# Patient Record
Sex: Male | Born: 1960 | Race: White | Hispanic: No | Marital: Married | Smoking: Never smoker
Health system: Southern US, Community
[De-identification: ages and names within clinical notes are randomized; demographics above are authoritative.]

## PROBLEM LIST (undated history)

## (undated) DIAGNOSIS — K5903 Drug induced constipation: Secondary | ICD-10-CM

## (undated) DIAGNOSIS — M1990 Unspecified osteoarthritis, unspecified site: Secondary | ICD-10-CM

## (undated) DIAGNOSIS — G629 Polyneuropathy, unspecified: Secondary | ICD-10-CM

## (undated) DIAGNOSIS — T402X5A Adverse effect of other opioids, initial encounter: Secondary | ICD-10-CM

## (undated) DIAGNOSIS — F419 Anxiety disorder, unspecified: Secondary | ICD-10-CM

## (undated) DIAGNOSIS — F32A Depression, unspecified: Secondary | ICD-10-CM

## (undated) DIAGNOSIS — Z87442 Personal history of urinary calculi: Secondary | ICD-10-CM

## (undated) HISTORY — PX: COLONOSCOPY: SHX174

## (undated) HISTORY — PX: JOINT REPLACEMENT: SHX530

## (undated) HISTORY — PX: THORACIC DISC SURGERY: SHX801

## (undated) HISTORY — PX: ROTATOR CUFF REPAIR: SHX139

---

## 2002-04-07 ENCOUNTER — Encounter: Payer: Self-pay | Admitting: Orthopedic Surgery

## 2002-04-07 ENCOUNTER — Ambulatory Visit (HOSPITAL_COMMUNITY): Admission: RE | Admit: 2002-04-07 | Discharge: 2002-04-07 | Payer: Self-pay | Admitting: Orthopedic Surgery

## 2002-04-13 ENCOUNTER — Encounter: Admission: RE | Admit: 2002-04-13 | Discharge: 2002-05-04 | Payer: Self-pay | Admitting: Orthopedic Surgery

## 2015-04-25 ENCOUNTER — Ambulatory Visit: Payer: BLUE CROSS/BLUE SHIELD | Attending: Neurosurgery | Admitting: Physical Therapy

## 2015-04-25 ENCOUNTER — Encounter: Payer: Self-pay | Admitting: Physical Therapy

## 2015-04-25 DIAGNOSIS — M5010 Cervical disc disorder with radiculopathy, unspecified cervical region: Secondary | ICD-10-CM | POA: Diagnosis present

## 2015-04-25 DIAGNOSIS — M25511 Pain in right shoulder: Secondary | ICD-10-CM | POA: Diagnosis present

## 2015-04-25 DIAGNOSIS — M542 Cervicalgia: Secondary | ICD-10-CM | POA: Diagnosis present

## 2015-04-25 DIAGNOSIS — M25512 Pain in left shoulder: Secondary | ICD-10-CM | POA: Diagnosis present

## 2015-04-25 NOTE — Therapy (Signed)
[facility] [address] [place], [place], [address] Phone: [phone]   Fax:  [phone]  Physical Therapy Evaluation  Patient Details  Name: Chase Blockerric Peyton MRN: [id number] Date of Birth: 07-19-1960 Referring Provider: Judge Stalllifton Baker  Encounter Date: 04/25/2015      PT End of Session - 04/25/15 0858    Visit Number 1   Date for PT Re-Evaluation 06/16/15   PT Start Time 0838   PT Stop Time 0940   PT Time Calculation (min) 62 min   Activity Tolerance Patient tolerated treatment well   Behavior During Therapy [facility] for tasks assessed/performed      History reviewed. No pertinent past medical history.  History reviewed. No pertinent past surgical history.  There were no vitals filed for this visit.  Visit Diagnosis:  Cervical disc disorder with radiculopathy of cervical region - Plan: PT plan of care cert/re-cert  Cervical pain - Plan: PT plan of care cert/re-cert  Pain of both shoulder joints - Plan: PT plan of care cert/re-cert      Subjective Assessment - 04/25/15 0831    Subjective Patient reports neck and pain in both shoulders, reports that he has soime numbness in the right side of the face, reports that he was on prednisone and that helped a lot, now off of it and hurting more.   Diagnostic tests MRI showed nerve root impingement and stenosis   Patient Stated Goals have less pain   Currently in Pain? Yes   Pain Score 8    Pain Location Shoulder   Pain Orientation Right;Left   Pain Descriptors / Indicators Aching;Sharp;Shooting;Stabbing   Pain Type Acute pain   Pain Onset More than a month ago   Pain Frequency Constant   Aggravating Factors  reports that with any reaching, lifting, pushing and pulling he has increased bilateral shoulder pain up to 9/10, c/o numbness in the right side of the face and the right 4th and 5th digits   Pain Relieving Factors rest and prednisone, had pain down to 2/10   Effect  of Pain on Daily Activities difficulty with all things            [facility] - 04/25/15 0001    Assessment   Medical Diagnosis cervical radiculopathy   Referring Provider Judge Stalllifton Baker   Onset Date/Surgical Date 04/16/15   Hand Dominance Right   Prior Therapy none   Precautions   Precautions None   Balance Screen   Has the patient fallen in the past 6 months No   Has the patient had a decrease in activity level because of a fear of falling?  No   Is the patient reluctant to leave their home because of a fear of falling?  No   Home Environment   Additional Comments housework and yardwork   Prior Function   Level of Independence Independent   Vocation Full time employment   Vocation Requirements mostly sitting   Leisure go to gym 3x/week   AROM   Overall AROM Comments Cervical ROM was decreased 25% for all motions with pain, Shoulder ROM was WFL's with some gaurding and pain in the shoulders   Strength   Overall Strength Comments Shoulder strength was 4/5 for flexion and abduction, ER was 3+/5 bilaterally with pain in the shoulders, IR was 4-/5 with pain, biceps 4/5, triceps 4/5   Flexibility   Soft Tissue Assessment /Muscle Length --  + neural tension signs in the right  UE   Palpation   Palpation comment he is tight in the upper traps, tender in the posterior shoulders.   Special Tests    Special Tests --  + ulnar nerve tension                   [facility] Adult PT Treatment/Exercise - 04/25/15 0001    Modalities   Modalities Electrical Stimulation;Moist Heat;Traction   Moist Heat Therapy   Number Minutes Moist Heat 15 Minutes   Moist Heat Location Cervical   Electrical Stimulation   Electrical Stimulation Location cervical   Electrical Stimulation Action IFC   Electrical Stimulation Parameters tolerance   Electrical Stimulation Goals Pain   Traction   Type of Traction Cervical   Min (lbs) 16   Hold Time static   Time 15                   PT Short Term Goals - 04/25/15 0903    PT SHORT TERM GOAL #1   Title independent with initial HEP   Time 1   Period Weeks   Status New           PT Long Term Goals - 04/25/15 0907    PT LONG TERM GOAL #1   Title increase cervical ROM to WFL's   Time 8   Period Weeks   Status New   PT LONG TERM GOAL #2   Title report pain decreased 25%   Time 8   Period Weeks   Status New   PT LONG TERM GOAL #3   Title report that he can do yardwork without difficulty   Time 8   Period Weeks   Status New   PT LONG TERM GOAL #4   Title report that he understands posture and body mechanics   Time 8   Period Weeks   Status New               Plan - 04/25/15 0900    Clinical Impression Statement Patient with cervical radiculopathy, worse on the right, numb in the right ulnar dermatome, + neural tension bilateral but R>L.     Pt will benefit from skilled therapeutic intervention in order to improve on the following deficits Pain;Impaired UE functional use;Increased muscle spasms;Impaired flexibility;Decreased strength;Decreased range of motion   Rehab Potential Good   PT Frequency 2x / week   PT Duration 6 weeks   PT Treatment/Interventions Electrical Stimulation;Moist Heat;Therapeutic exercise;Traction;Ultrasound;Manual techniques;Patient/family education   PT Next Visit Plan See if traction helped, may look at gym activities for stability exercises   Consulted and Agree with Plan of Care Patient         Problem List There are no active problems to display for this patient.   Jearld Lesch., PT 04/25/2015, 9:16 AM  [facility] [address] [place], [place], [address] Phone: [phone]   Fax:  [phone]  Name: Chase Gonzalez MRN: [id number] Date of Birth: [birth year]

## 2015-05-01 ENCOUNTER — Ambulatory Visit: Payer: BLUE CROSS/BLUE SHIELD | Admitting: Physical Therapy

## 2015-05-01 ENCOUNTER — Encounter: Payer: Self-pay | Admitting: Physical Therapy

## 2015-05-01 DIAGNOSIS — M542 Cervicalgia: Secondary | ICD-10-CM

## 2015-05-01 DIAGNOSIS — M25512 Pain in left shoulder: Secondary | ICD-10-CM

## 2015-05-01 DIAGNOSIS — M5010 Cervical disc disorder with radiculopathy, unspecified cervical region: Secondary | ICD-10-CM | POA: Diagnosis not present

## 2015-05-01 DIAGNOSIS — M25511 Pain in right shoulder: Secondary | ICD-10-CM

## 2015-05-01 NOTE — Therapy (Signed)
[address] [place], [place], [address] Phone: [phone]   Fax:  [phone]  Physical Therapy Treatment  Patient Details  Name: Chase Gonzalez MRN: [id number] Date of Birth: 1960/12/07 Referring Provider: Guadalupe Maple  Encounter Date: 05/01/2015      PT End of Session - 05/01/15 1646    Visit Number 2   Date for PT Re-Evaluation 06/16/15   PT Start Time 1600   PT Stop Time 1704   PT Time Calculation (min) 64 min      History reviewed. No pertinent past medical history.  History reviewed. No pertinent past surgical history.  There were no vitals filed for this visit.  Visit Diagnosis:  Cervical pain  Pain of both shoulder joints  Cervical disc disorder with radiculopathy of cervical region      Subjective Assessment - 05/01/15 1557    Subjective Pt reports no change since eval. Reports a pinched nerve in R elbow and bicep   Pain Score 0-No pain   Pain Location Knee   Pain Orientation Left   Pain Descriptors / Indicators Aching                         [facility] Adult PT Treatment/Exercise - 05/01/15 0001    Exercises   Exercises Lumbar;Knee/Hip;Shoulder   Lumbar Exercises: Supine   Other Supine Lumbar Exercises Neck retraction 3 way x15 supine on physo ball   Knee/Hip Exercises: Aerobic   Nustep L5 X6 min   Knee/Hip Exercises: Standing   Other Standing Knee Exercises standing rev grip rows #45 2x15; standing straight arm pull downs #45 2x15    Shoulder Exercises: Seated   Row Both;15 reps;Weights  2 sets    Row Weight (lbs) #35   Other Seated Exercises Lat pull downs #35 2x15   Shoulder Exercises: Standing   Horizontal ABduction 12 reps;Theraband  2 sets   Theraband Level (Shoulder Horizontal ABduction) Level 4 (Blue)   External Rotation 12 reps;Theraband  2 sets    Theraband Level (Shoulder External Rotation) Level 1 (Yellow)   Moist Heat Therapy   Number Minutes  Moist Heat 15 Minutes   Moist Heat Location Cervical   Electrical Stimulation   Electrical Stimulation Location bilat shoulder    Electrical Stimulation Action premod   Electrical Stimulation Parameters tolerance    Electrical Stimulation Goals Pain   Traction   Type of Traction Cervical   Min (lbs) 16   Hold Time static   Time 10   Manual Therapy   Manual Therapy Soft tissue mobilization;Passive ROM   Soft tissue mobilization Cervical para spinales, scalenes    Passive ROM Cervical ROM all direction, taken to end ranges and held                  PT Short Term Goals - 05/01/15 1648    PT SHORT TERM GOAL #1   Title independent with initial HEP   Status Partially Met           PT Long Term Goals - 04/25/15 0907    PT LONG TERM GOAL #1   Title increase cervical ROM to WFL's   Time 8   Period Weeks   Status New   PT LONG TERM GOAL #2   Title report pain decreased 25%   Time 8   Period Weeks   Status New   PT LONG TERM GOAL #3   Title  report that he can do yardwork without difficulty   Time 8   Period Weeks   Status New   PT LONG TERM GOAL #4   Title report that he understands posture and body mechanics   Time 8   Period Weeks   Status New               Plan - 05/01/15 1646    Clinical Impression Statement Progresses to gym level exercises challenging the cervical spine and back. Performed all interventions well, does report numbness when actively turning his head to the right and pushing back against physo ball. shows good strength with rows and Lat pull down.    Pt will benefit from skilled therapeutic intervention in order to improve on the following deficits Pain;Impaired UE functional use;Increased muscle spasms;Impaired flexibility;Decreased strength;Decreased range of motion   Rehab Potential Good   PT Duration 6 weeks   PT Treatment/Interventions Electrical Stimulation;Moist Heat;Therapeutic exercise;Traction;Ultrasound;Manual  techniques;Patient/family education   PT Next Visit Plan See if traction helped again, gym activities for stability exercises        Problem List There are no active problems to display for this patient.   Scot Jun, PTA 05/01/2015, 4:56 PM  [address] [place], [place], [address] Phone: [phone]   Fax:  [phone]  Name: Chase Gonzalez MRN: [id number] Date of Birth: 06-26-1960

## 2015-05-03 ENCOUNTER — Ambulatory Visit: Payer: BLUE CROSS/BLUE SHIELD | Admitting: Physical Therapy

## 2015-05-03 ENCOUNTER — Encounter: Payer: Self-pay | Admitting: Physical Therapy

## 2015-05-03 DIAGNOSIS — M542 Cervicalgia: Secondary | ICD-10-CM

## 2015-05-03 DIAGNOSIS — M25512 Pain in left shoulder: Secondary | ICD-10-CM

## 2015-05-03 DIAGNOSIS — M5010 Cervical disc disorder with radiculopathy, unspecified cervical region: Secondary | ICD-10-CM

## 2015-05-03 DIAGNOSIS — M25511 Pain in right shoulder: Secondary | ICD-10-CM

## 2015-05-03 NOTE — Therapy (Signed)
[facility] [place] [facility] [address] [place], [place], [address] Phone: [phone]   Fax:  [phone]  Physical Therapy Treatment  Patient Details  Name: Chase Gonzalez MRN: [id number] Date of Birth: December 21, 1960 Referring Provider: Judge Stall  Encounter Date: 05/03/2015      PT End of Session - 05/03/15 1638    Visit Number 3   Date for PT Re-Evaluation 06/16/15   PT Start Time 1610   PT Stop Time 1710   PT Time Calculation (min) 60 min      History reviewed. No pertinent past medical history.  History reviewed. No pertinent past surgical history.  There were no vitals filed for this visit.  Visit Diagnosis:  Cervical pain  Pain of both shoulder joints  Cervical disc disorder with radiculopathy of cervical region      Subjective Assessment - 05/03/15 1610    Subjective Pt reports numbness down his left arm is bothering him the most.  Pain c/o pain in his left shoulder that keeps him up at night, and pain in the right shoulder with lifting. Bathing is particuarly hard for him. DId feel a lot of relief and slept through the night after therapy on Tuesday.    Currently in Pain? No/denies   Pain Score 0-No pain                         [facility] Adult PT Treatment/Exercise - 05/03/15 0001    Lumbar Exercises: Aerobic   UBE (Upper Arm Bike) L2 fwd/ 3 back   Lumbar Exercises: Supine   Other Supine Lumbar Exercises Neck retraction 3 way x15    Other Supine Lumbar Exercises chin tucks ball on wall x 15   Shoulder Exercises: Seated   Row Both;15 reps;Weights  2 sets    Row Weight (lbs) #35   Shoulder Exercises: Standing   Horizontal ABduction 12 reps;Theraband  2 sets   Theraband Level (Shoulder Horizontal ABduction) Level 4 (Blue)   External Rotation 12 reps;Theraband  2 sets    Theraband Level (Shoulder External Rotation) Level 1 (Yellow)   Modalities   Modalities Electrical Stimulation;Moist  Heat;Traction   Moist Heat Therapy   Number Minutes Moist Heat 15 Minutes   Moist Heat Location Cervical;Shoulder   Electrical Stimulation   Electrical Stimulation Location bilat shoulder    Electrical Stimulation Action Premod   Electrical Stimulation Parameters tolerance   Electrical Stimulation Goals Pain   Traction   Type of Traction Cervical   Min (lbs) 16   Hold Time static   Time 15                PT Education - 05/03/15 1637    Education provided Yes   Education Details Instructed patients on chin tucks           PT Short Term Goals - 05/03/15 1641    PT SHORT TERM GOAL #1   Title independent with initial HEP   Baseline Reports he has been working on tendon glide exercises still can not complete without pain   Status Achieved           PT Long Term Goals - 05/03/15 1642    PT LONG TERM GOAL #2   Title report pain decreased 25%   Status On-going   PT LONG TERM GOAL #3   Title report that he can do yardwork without difficulty   Status On-going   PT  LONG TERM GOAL #4   Title report that he understands posture and body mechanics   Status On-going               Plan - 05/03/15 1640    Clinical Impression Statement Pt reported less nubness with right cervical rotation exercises. Reports numbness seems a little more intense at the base of the neck but is not extending down his arm anymore. Pt reported a lot of relieft with E stim and cervical traction after last appointment.         Problem List There are no active problems to display for this patient.   Meyer RusselMaegan Pieter Fooks, SPTA 05/03/2015, 4:57 PM  [facility] [address] [place], [place], [address] Phone: [phone]   Fax:  [phone]  Name: Chase Gonzalez MRN: [id number] Date of Birth: 13-Jun-1961

## 2015-05-09 ENCOUNTER — Ambulatory Visit: Payer: BLUE CROSS/BLUE SHIELD | Admitting: Physical Therapy

## 2015-05-16 ENCOUNTER — Ambulatory Visit: Payer: BLUE CROSS/BLUE SHIELD | Admitting: Physical Therapy

## 2015-05-16 DIAGNOSIS — M5010 Cervical disc disorder with radiculopathy, unspecified cervical region: Secondary | ICD-10-CM | POA: Diagnosis not present

## 2015-05-16 DIAGNOSIS — M25511 Pain in right shoulder: Secondary | ICD-10-CM

## 2015-05-16 DIAGNOSIS — M25512 Pain in left shoulder: Secondary | ICD-10-CM

## 2015-05-16 DIAGNOSIS — M542 Cervicalgia: Secondary | ICD-10-CM

## 2015-05-16 NOTE — Therapy (Signed)
[facility] [place] [facility] [address] [place], [place], [address] Phone: [phone]   Fax:  [phone]  Physical Therapy Treatment  Patient Details  Name: Chase Gonzalez MRN: [id number] Date of Birth: [birth year] Referring Provider: Judge Stall  Encounter Date: 05/16/2015      PT End of Session - 05/16/15 1658    Visit Number 4   PT Start Time 1607   PT Stop Time 1710   PT Time Calculation (min) 63 min      No past medical history on file.  No past surgical history on file.  There were no vitals filed for this visit.  Visit Diagnosis:  Cervical pain  Pain of both shoulder joints  Cervical disc disorder with radiculopathy of cervical region      Subjective Assessment - 05/16/15 1618    Subjective Pt reports his numbness has worsened, small movements of his neck to the right and sitting for too long at work bothers him most. Both his shoulders are bothering him as well, reports he is sleeping in his recliner because he can't sleep in his bed.  Reported no pain at the start of therapy, reports it is not conitinuous. Pt unable to do any manual labor secondary to pain.    Currently in Pain? No/denies   Pain Score 0-No pain   Pain Location Neck   Pain Orientation Medial;Right   Pain Descriptors / Indicators Aching;Numbness   Pain Type Chronic pain   Pain Onset More than a month ago                         [facility] Adult PT Treatment/Exercise - 05/16/15 0001    Lumbar Exercises: Aerobic   UBE (Upper Arm Bike) L2 fwd/ 3 back   Shoulder Exercises: Seated   Row Both;15 reps;Weights  2 sets    Row Weight (lbs) #35   Shoulder Exercises: Standing   Horizontal ABduction 12 reps;AAROM;Strengthening;Theraband  red, 2 sets    External Rotation --  unable to complete secondary to c/o pain in L shoulder   Modalities   Modalities Electrical Stimulation;Moist Heat;Iontophoresis   Electrical Stimulation   Electrical Stimulation Location cervical and L shoulder    Electrical Stimulation Action Premod   Electrical Stimulation Parameters tolerance   Electrical Stimulation Goals Pain   Iontophoresis   Type of Iontophoresis Dexamethasone   Location L lateral shoulder    Dose 80 mA   Time 4 hr   Manual Therapy   Manual Therapy Joint mobilization;Soft tissue mobilization;Passive ROM;Neural Stretch;Manual Traction   Manual therapy comments Performed by supervising PT   Passive ROM PROM with some contract relax into rotation   Manual Traction occipital release   Neural Stretch right UE passively                  PT Short Term Goals - 05/03/15 1641    PT SHORT TERM GOAL #1   Title independent with initial HEP   Baseline Reports he has been working on tendon glide exercises still can not complete without pain   Status Achieved           PT Long Term Goals - 05/03/15 1642    PT LONG TERM GOAL #2   Title report pain decreased 25%   Status On-going   PT LONG TERM GOAL #3   Title report that he can do yardwork without difficulty   Status On-going  PT LONG TERM GOAL #4   Title report that he understands posture and body mechanics   Status On-going               Problem List There are no active problems to display for this patient.   Jearld LeschALBRIGHT,Cordie Buening W., PT 05/16/2015, 5:51 PM  [facility] [address] [place], [place], [address] Phone: [phone]   Fax:  [phone]  Name: Chase Gonzalez MRN: [id number] Date of Birth: [birth year]

## 2015-10-11 ENCOUNTER — Encounter: Payer: Self-pay | Admitting: Physical Therapy

## 2015-10-11 ENCOUNTER — Ambulatory Visit: Payer: BLUE CROSS/BLUE SHIELD | Attending: Sports Medicine | Admitting: Physical Therapy

## 2015-10-11 DIAGNOSIS — M25512 Pain in left shoulder: Secondary | ICD-10-CM

## 2015-10-11 DIAGNOSIS — M25612 Stiffness of left shoulder, not elsewhere classified: Secondary | ICD-10-CM

## 2015-10-11 NOTE — Therapy (Signed)
[facility] [address] [place], [place], [address] Phone: [phone]   Fax:  [phone]  Physical Therapy Evaluation  Patient Details  Name: Plez Belton MRN: [id number] Date of Birth: August 29, 1960 Referring Provider: Gust Brooms  Encounter Date: 10/11/2015      PT End of Session - 10/11/15 1526    Visit Number 1   Date for PT Re-Evaluation 12/11/15   PT Start Time 1440   PT Stop Time 1530   PT Time Calculation (min) 50 min   Activity Tolerance Patient tolerated treatment well   Behavior During Therapy [facility] for tasks assessed/performed      History reviewed. No pertinent past medical history.  History reviewed. No pertinent past surgical history.  There were no vitals filed for this visit.       Subjective Assessment - 10/11/15 1443    Subjective Patient reports that he underwent a left RC repair, SAD and DCR on 08/29/15.  He has been cautious for the past 5 weeks doing what MD told him per his report   Limitations Lifting   Patient Stated Goals have normal ROM and no pain   Currently in Pain? Yes   Pain Score 1    Pain Location Shoulder   Pain Orientation Left   Pain Descriptors / Indicators Aching   Pain Type Surgical pain   Pain Onset More than a month ago   Pain Frequency Intermittent   Aggravating Factors  reaching, dressing and doing hair will increase pain 5/10   Pain Relieving Factors rest   Effect of Pain on Daily Activities jsut limited with all ADL's            [facility] PT Assessment - 10/11/15 0001    Assessment   Medical Diagnosis s/p left RC repair   Referring Provider Gust Brooms   Onset Date/Surgical Date 08/29/15   Hand Dominance Right   Prior Therapy no   Precautions   Precaution Comments RC repair protocol   Balance Screen   Has the patient fallen in the past 6 months No   Has the patient had a decrease in activity level because of a fear of falling?  No   Is the patient  reluctant to leave their home because of a fear of falling?  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   Leisure likes to build things, does the elliptical   Posture/Postural Control   Posture Comments fwd head, rounded shoulders   ROM / Strength   AROM / PROM / Strength AROM;PROM;Strength   AROM   AROM Assessment Site Shoulder   Right/Left Shoulder Left   Left Shoulder Flexion 60 Degrees   Left Shoulder ABduction 36 Degrees   Left Shoulder Internal Rotation 30 Degrees   Left Shoulder External Rotation 10 Degrees   PROM   PROM Assessment Site Shoulder   Right/Left Shoulder Left   Left Shoulder Flexion 105 Degrees   Left Shoulder ABduction 45 Degrees   Left Shoulder Internal Rotation 42 Degrees   Left Shoulder External Rotation 15 Degrees   Palpation   Palpation comment scars healed, some tenderness along the left shoulder distal clavicle, tight in the upper trap and neck  PT Education - 10/11/15 1525    Education provided Yes   Education Details Self PROM of the left shoulder all motions plus pendulum   Person(s) Educated Patient   Methods Explanation;Demonstration;Handout   Comprehension Verbalized understanding          PT Short Term Goals - 10/11/15 1529    PT SHORT TERM GOAL #1   Title independent with initial HEP   Time 1   Period Weeks   Status New           PT Long Term Goals - 10/11/15 1529    PT LONG TERM GOAL #1   Title increase left shoulder flexion to 140 degrees   Time 8   Period Weeks   Status New   PT LONG TERM GOAL #2   Title report pain decreased 25%   Time 8   Period Weeks   Status New   PT LONG TERM GOAL #3   Title report no difficulty with doing hair or dressing   Time 8   Period Weeks   Status New   PT LONG TERM GOAL #4   Title  lift 5# to shoulder height   Time 8   Period Weeks   Status New               Plan - 10/11/15 1527    Clinical Impression Statement Patient underwent a left shoulder RC repair, DCR and SAD on 08/29/15, he has been doing no exercises since the surgery per MD order to protect the repair.  He is very gaurded but does not have a high rating of pain   Rehab Potential Good   PT Frequency 2x / week   PT Duration 8 weeks   PT Treatment/Interventions ADLs/Self Care Home Management;Cryotherapy;Electrical Stimulation;Therapeutic exercise;Therapeutic activities;Ultrasound;Neuromuscular re-education;Patient/family education;Manual techniques;Taping;Passive range of motion   PT Next Visit Plan Slowly add exercises, he is at week 6 of RC protocol, but has not done anything so first priority is to gain ROM   Consulted and Agree with Plan of Care Patient      Patient will benefit from skilled therapeutic intervention in order to improve the following deficits and impairments:  Decreased range of motion, Decreased strength, Increased fascial restricitons, Increased muscle spasms, Postural dysfunction, Improper body mechanics, Pain, Impaired UE functional use  Visit Diagnosis: Pain in left shoulder - Plan: PT plan of care cert/re-cert  Stiffness of left shoulder, not elsewhere classified - Plan: PT plan of care cert/re-cert     Problem List There are no active problems to display for this patient.   Jearld LeschALBRIGHT,Marbin Olshefski W ., PT  10/11/2015, 3:32 PM  [facility] [address] [place], [place], [address] Phone: [phone]   Fax:  [phone]  Name: Rogers Blockerric Kritikos MRN: [id number] Date of Birth: [birth year]

## 2015-10-15 ENCOUNTER — Ambulatory Visit: Payer: BLUE CROSS/BLUE SHIELD | Attending: Sports Medicine | Admitting: Physical Therapy

## 2015-10-15 ENCOUNTER — Encounter: Payer: Self-pay | Admitting: Physical Therapy

## 2015-10-15 DIAGNOSIS — M25512 Pain in left shoulder: Secondary | ICD-10-CM

## 2015-10-15 DIAGNOSIS — M25612 Stiffness of left shoulder, not elsewhere classified: Secondary | ICD-10-CM | POA: Insufficient documentation

## 2015-10-15 DIAGNOSIS — R2232 Localized swelling, mass and lump, left upper limb: Secondary | ICD-10-CM | POA: Diagnosis present

## 2015-10-15 NOTE — Therapy (Signed)
[facility] [address] [place], [place], [address] Phone: [phone]   Fax:  [phone]  Physical Therapy Treatment  Patient Details  Name: Chase Blockerric Hiney MRN: [id number] Date of Birth: 1960/12/20 Referring Provider: Gust BroomsKen Lennon  Encounter Date: 10/15/2015      PT End of Session - 10/15/15 1603    Visit Number 2   Date for PT Re-Evaluation 12/11/15   PT Start Time 1515   PT Stop Time 1614   PT Time Calculation (min) 59 min   Activity Tolerance Patient tolerated treatment well   Behavior During Therapy [facility] for tasks assessed/performed      History reviewed. No pertinent past medical history.  History reviewed. No pertinent past surgical history.  There were no vitals filed for this visit.      Subjective Assessment - 10/15/15 1520    Subjective "Shoulder doing fine"   Currently in Pain? No/denies   Pain Score 0-No pain                         [facility] Adult PT Treatment/Exercise - 10/15/15 0001    Exercises   Exercises Shoulder   Shoulder Exercises: Standing   External Rotation 12 reps  x2   Theraband Level (Shoulder External Rotation) Level 1 (Yellow)   Extension 15 reps;Theraband  x2   Theraband Level (Shoulder Extension) Level 1 (Yellow)   Row 15 reps;Theraband  x2   Theraband Level (Shoulder Row) Level 2 (Red)   Other Standing Exercises Ladder flex & abd X10    Shoulder Exercises: ROM/Strengthening   UBE (Upper Arm Bike) L1 853frd/3rev   Modalities   Modalities Electrical Stimulation;Vasopneumatic   Electrical Stimulation   Electrical Stimulation Location L shoulder    Electrical Stimulation Action IFC   Electrical Stimulation Parameters Pt tolerance    Electrical Stimulation Goals Pain   Vasopneumatic   Number Minutes Vasopneumatic  15 minutes   Vasopnuematic Location  Shoulder   Vasopneumatic Pressure Medium   Vasopneumatic Temperature  34   Manual Therapy   Manual Therapy  Joint mobilization;Passive ROM   Manual therapy comments pt guarded    Joint Mobilization graded 2&3   Passive ROM PROM taken to end range and held multiple times                  PT Short Term Goals - 10/11/15 1529    PT SHORT TERM GOAL #1   Title independent with initial HEP   Time 1   Period Weeks   Status New           PT Long Term Goals - 10/11/15 1529    PT LONG TERM GOAL #1   Title increase left shoulder flexion to 140 degrees   Time 8   Period Weeks   Status New   PT LONG TERM GOAL #2   Title report pain decreased 25%   Time 8   Period Weeks   Status New   PT LONG TERM GOAL #3   Title report no difficulty with doing hair or dressing   Time 8   Period Weeks   Status New   PT LONG TERM GOAL #4   Title lift 5# to shoulder height   Time 8   Period Weeks   Status New               Plan - 10/15/15 1605    Clinical Impression Statement Pt  tolerated a progression to AAROM interventions well. No issue with scapular stabilization exercises with Tband. Does have some pain and tightness on ladder in abduction. Pt guarded at times with MT, difficult for pt to relax.   Rehab Potential Good   PT Frequency 2x / week   PT Duration 8 weeks   PT Treatment/Interventions ADLs/Self Care Home Management;Cryotherapy;Electrical Stimulation;Therapeutic exercise;Therapeutic activities;Ultrasound;Neuromuscular re-education;Patient/family education;Manual techniques;Taping;Passive range of motion   PT Next Visit Plan Slowly add exercises, he is at week 6 of RC protocol, but has not done anything so first priority is to gain ROM      Patient will benefit from skilled therapeutic intervention in order to improve the following deficits and impairments:  Decreased range of motion, Decreased strength, Increased fascial restricitons, Increased muscle spasms, Postural dysfunction, Improper body mechanics, Pain, Impaired UE functional use  Visit Diagnosis: Pain in left  shoulder  Stiffness of left shoulder, not elsewhere classified  Localized swelling, mass and lump, left upper limb     Problem List There are no active problems to display for this patient.   Veatrice Eckstein G PembeGrayce Sessions2017, 4:09 PM  [facility] [address] [place], [place], [address] Phone: [phone]   Fax:  [phone]  Name: Chase Gonzalez MRN: [id number] Date of Birth: [birth year]

## 2015-10-17 ENCOUNTER — Encounter: Payer: Self-pay | Admitting: Physical Therapy

## 2015-10-17 ENCOUNTER — Ambulatory Visit: Payer: BLUE CROSS/BLUE SHIELD | Admitting: Physical Therapy

## 2015-10-17 DIAGNOSIS — M25612 Stiffness of left shoulder, not elsewhere classified: Secondary | ICD-10-CM

## 2015-10-17 DIAGNOSIS — M25512 Pain in left shoulder: Secondary | ICD-10-CM

## 2015-10-17 DIAGNOSIS — R2232 Localized swelling, mass and lump, left upper limb: Secondary | ICD-10-CM

## 2015-10-17 NOTE — Therapy (Signed)
[place] [address] [place], [place], [address] Phone: [phone]   Fax:  [phone]  Physical Therapy Treatment  Patient Details  Name: Chase Gonzalez MRN: [id number] Date of Birth: [birth year] Referring Provider: Judeth Cornfield  Encounter Date: 10/17/2015      PT End of Session - 10/17/15 1555    Visit Number 3   Date for PT Re-Evaluation 12/11/15   PT Start Time 5993   PT Stop Time 1609   PT Time Calculation (min) 59 min   Activity Tolerance Patient tolerated treatment well   Behavior During Therapy [facility] for tasks assessed/performed      History reviewed. No pertinent past medical history.  History reviewed. No pertinent past surgical history.  There were no vitals filed for this visit.                       [place] Adult PT Treatment/Exercise - 10/17/15 0001    Exercises   Exercises Shoulder   Shoulder Exercises: Seated   Row 10 reps;Weights  x2   Row Weight (lbs) 20   Other Seated Exercises lats #20 2x10    Shoulder Exercises: Standing   Flexion 10 reps;AROM  x2, L shoulder elevation   ABduction 10 reps;PROM  x2; shoulder elevation    Extension 20 reps;Theraband  x2   Theraband Level (Shoulder Extension) Level 2 (Red)   Row 15 reps;Theraband  x2   Theraband Level (Shoulder Row) Level 3 (Green)   Other Standing Exercises Ladder flex & abd X10    Shoulder Exercises: ROM/Strengthening   UBE (Upper Arm Bike) L2 58fd/3rev   Modalities   Modalities Electrical Stimulation;Vasopneumatic   Electrical Stimulation   Electrical Stimulation Location L shoulder    Electrical Stimulation Action IFC   Electrical Stimulation Parameters tolerance    Electrical Stimulation Goals Pain   Vasopneumatic   Number Minutes Vasopneumatic  15 minutes   Vasopnuematic Location  Shoulder   Vasopneumatic Pressure Medium   Vasopneumatic Temperature  34   Manual Therapy   Manual Therapy Joint  mobilization;Passive ROM   Manual therapy comments pt guarded    Joint Mobilization graded 2&3   Passive ROM PROM taken to end range and held multiple times                  PT Short Term Goals - 10/17/15 1557    PT SHORT TERM GOAL #1   Title independent with initial HEP   Status Achieved           PT Long Term Goals - 10/11/15 1529    PT LONG TERM GOAL #1   Title increase left shoulder flexion to 140 degrees   Time 8   Period Weeks   Status New   PT LONG TERM GOAL #2   Title report pain decreased 25%   Time 8   Period Weeks   Status New   PT LONG TERM GOAL #3   Title report no difficulty with doing hair or dressing   Time 8   Period Weeks   Status New   PT LONG TERM GOAL #4   Title lift 5# to shoulder height   Time 8   Period Weeks   Status New               Plan - 10/17/15 1555    Clinical Impression Statement Pt had met [facility]. Pt performed well in therapy, appears frustrated at  time because he couldn't start therapy sooner. Shoulder elevation with both active shoulder flexion and abduction. Again pain with ladder flexion and abduction at end range as well as on the descents.    Rehab Potential Good   PT Frequency 2x / week   PT Duration 8 weeks   PT Treatment/Interventions ADLs/Self Care Home Management;Cryotherapy;Electrical Stimulation;Therapeutic exercise;Therapeutic activities;Ultrasound;Neuromuscular re-education;Patient/family education;Manual techniques;Taping;Passive range of motion   PT Next Visit Plan Slowly add exercises, he is at week 6 of RC protocol, but has not done anything so first priority is to gain ROM      Patient will benefit from skilled therapeutic intervention in order to improve the following deficits and impairments:  Decreased range of motion, Decreased strength, Increased fascial restricitons, Increased muscle spasms, Postural dysfunction, Improper body mechanics, Pain, Impaired UE functional use  Visit  Diagnosis: Pain in left shoulder  Stiffness of left shoulder, not elsewhere classified  Localized swelling, mass and lump, left upper limb     Problem List There are no active problems to display for this patient.   Scot Jun, PTA  10/17/2015, 3:58 PM  [place] [address] [place], [place], [address] Phone: [phone]   Fax:  [phone]  Name: Chase Gonzalez MRN: [id number] Date of Birth: [birth year]

## 2015-10-23 ENCOUNTER — Ambulatory Visit: Payer: BLUE CROSS/BLUE SHIELD | Admitting: Physical Therapy

## 2015-10-23 ENCOUNTER — Encounter: Payer: Self-pay | Admitting: Physical Therapy

## 2015-10-23 DIAGNOSIS — M25612 Stiffness of left shoulder, not elsewhere classified: Secondary | ICD-10-CM

## 2015-10-23 DIAGNOSIS — M25512 Pain in left shoulder: Secondary | ICD-10-CM | POA: Diagnosis not present

## 2015-10-23 DIAGNOSIS — R2232 Localized swelling, mass and lump, left upper limb: Secondary | ICD-10-CM

## 2015-10-23 NOTE — Therapy (Signed)
[facility] [address] [place], [place], [address] Phone: [phone]   Fax:  [phone]  Physical Therapy Treatment  Patient Details  Name: Chase Gonzalez MRN: [id number] Date of Birth: [birth year] Referring Provider: Gust Brooms  Encounter Date: 10/23/2015      PT End of Session - 10/23/15 1645    Visit Number 4   Date for PT Re-Evaluation 12/11/15   PT Start Time 1600   PT Stop Time 1700   PT Time Calculation (min) 60 min   Activity Tolerance Patient tolerated treatment well   Behavior During Therapy [facility] for tasks assessed/performed      History reviewed. No pertinent past medical history.  History reviewed. No pertinent past surgical history.  There were no vitals filed for this visit.      Subjective Assessment - 10/23/15 1605    Subjective "Going pretty good, I rotated the tires on my jeep Sunday, changed the oil in my jeep yesterday." "I can sleep on my L shoulder fairly good I will wake me up after a couple of hours."   Currently in Pain? No/denies   Pain Score 0-No pain                         [facility] Adult PT Treatment/Exercise - 10/23/15 0001    Shoulder Exercises: Seated   Row Weights;15 reps   Row Weight (lbs) 25   Other Seated Exercises lats #25 2x15    Shoulder Exercises: Standing   Flexion 10 reps;AROM  x2   ABduction 10 reps;PROM  x2   Other Standing Exercises Ladder flex & abd X10    Other Standing Exercises Bicep curls #3 2x15, Cane exercises Ext/Flx/IR x10    Shoulder Exercises: ROM/Strengthening   UBE (Upper Arm Bike) L2 39frd/3rev   Modalities   Modalities Electrical Stimulation;Cryotherapy   Cryotherapy   Number Minutes Cryotherapy 15 Minutes   Cryotherapy Location Shoulder   Type of Cryotherapy Ice pack   Electrical Stimulation   Electrical Stimulation Location L shoulder    Electrical Stimulation Action IFC   Electrical Stimulation Parameters tolerance   Electrical Stimulation Goals Pain                  PT Short Term Goals - 10/17/15 1557    PT SHORT TERM GOAL #1   Title independent with initial HEP   Status Achieved           PT Long Term Goals - 10/11/15 1529    PT LONG TERM GOAL #1   Title increase left shoulder flexion to 140 degrees   Time 8   Period Weeks   Status New   PT LONG TERM GOAL #2   Title report pain decreased 25%   Time 8   Period Weeks   Status New   PT LONG TERM GOAL #3   Title report no difficulty with doing hair or dressing   Time 8   Period Weeks   Status New   PT LONG TERM GOAL #4   Title lift 5# to shoulder height   Time 8   Period Weeks   Status New               Plan - 10/23/15 1645    Clinical Impression Statement Continues to do well, L shoulder elevation with abduction and flexion remains. Demos good strength and control with scapular stabilization interventions. Reports that ladder  flexion  and abduction felt better.    Rehab Potential Good   PT Frequency 2x / week   PT Duration 8 weeks   PT Treatment/Interventions ADLs/Self Care Home Management;Cryotherapy;Electrical Stimulation;Therapeutic exercise;Therapeutic activities;Ultrasound;Neuromuscular re-education;Patient/family education;Manual techniques;Taping;Passive range of motion   PT Next Visit Plan Slowly add exercises, he is at week 6 of RC protocol, but has not done anything so first priority is to gain ROM      Patient will benefit from skilled therapeutic intervention in order to improve the following deficits and impairments:  Decreased range of motion, Decreased strength, Increased fascial restricitons, Increased muscle spasms, Postural dysfunction, Improper body mechanics, Pain, Impaired UE functional use  Visit Diagnosis: Pain in left shoulder  Stiffness of left shoulder, not elsewhere classified  Localized swelling, mass and lump, left upper limb     Problem List There are no active problems to  display for this patient.   Grayce Sessionsonald G Makaelah Cranfield, PTA  10/23/2015, 4:47 PM  [facility] [address] [place], [place], [address] Phone: [phone]   Fax:  [phone]  Name: Chase Gonzalez MRN: [id number] Date of Birth: April 06, 1961

## 2015-10-30 ENCOUNTER — Encounter: Payer: Self-pay | Admitting: Physical Therapy

## 2015-10-30 ENCOUNTER — Ambulatory Visit: Payer: BLUE CROSS/BLUE SHIELD | Admitting: Physical Therapy

## 2015-10-30 DIAGNOSIS — R2232 Localized swelling, mass and lump, left upper limb: Secondary | ICD-10-CM

## 2015-10-30 DIAGNOSIS — M25512 Pain in left shoulder: Secondary | ICD-10-CM | POA: Diagnosis not present

## 2015-10-30 DIAGNOSIS — M25612 Stiffness of left shoulder, not elsewhere classified: Secondary | ICD-10-CM

## 2015-10-30 NOTE — Therapy (Signed)
[facility] [address] [place], [place], [address] Phone: [phone]   Fax:  [phone]  Physical Therapy Treatment  Patient Details  Name: Chase Blockerric Dirk MRN: [id number] Date of Birth: Aug 11, 1960 Referring Provider: Gust BroomsKen Lennon  Encounter Date: 10/30/2015      PT End of Session - 10/30/15 1604    Visit Number 5   Date for PT Re-Evaluation 12/11/15   PT Start Time 1515   PT Stop Time 1620   PT Time Calculation (min) 65 min   Activity Tolerance Patient tolerated treatment well   Behavior During Therapy [facility] for tasks assessed/performed      History reviewed. No pertinent past medical history.  History reviewed. No pertinent past surgical history.  There were no vitals filed for this visit.      Subjective Assessment - 10/30/15 1513    Subjective "Heeling pretty good, Not bad at all"   Currently in Pain? No/denies   Pain Score 0-No pain            [facility] PT Assessment - 10/30/15 0001    AROM   AROM Assessment Site Shoulder   Right/Left Shoulder Left   Left Shoulder Flexion 84 Degrees   Left Shoulder ABduction 90 Degrees   Left Shoulder Internal Rotation 86 Degrees   Left Shoulder External Rotation 58 Degrees                     [facility] Adult PT Treatment/Exercise - 10/30/15 0001    Shoulder Exercises: Seated   Row Weights;15 reps  x2   Row Weight (lbs) 25   Other Seated Exercises lats #25 2x15    Other Seated Exercises Seated high low rows #35 2x15   Shoulder Exercises: Standing   Horizontal ABduction 15 reps;Theraband   Theraband Level (Shoulder Horizontal ABduction) Level 2 (Red)   External Rotation 15 reps;Theraband   Theraband Level (Shoulder External Rotation) Level 2 (Red)   Extension 20 reps;Theraband   Theraband Level (Shoulder Extension) Level 3 (Green)   Row 20 reps;Theraband   Theraband Level (Shoulder Row) Level 4 (Blue)   Other Standing Exercises Ladder flex & abd X10     Other Standing Exercises Bicep curls #3 2x15, AAROM Cane exercises Ext #3/Flx/IR#3 x10    Shoulder Exercises: ROM/Strengthening   UBE (Upper Arm Bike) L2 903frd/3rev   Modalities   Modalities Electrical Stimulation;Cryotherapy   Programme researcher, broadcasting/film/videolectrical Stimulation   Electrical Stimulation Location L shoulder    Electrical Stimulation Action IFC   Electrical Stimulation Parameters tolerance   Electrical Stimulation Goals Pain   Manual Therapy   Manual Therapy Joint mobilization;Passive ROM   Manual therapy comments pt guarded    Joint Mobilization grades 2 & 3   Passive ROM PROM taken to end range and held multiple times                  PT Short Term Goals - 10/17/15 1557    PT SHORT TERM GOAL #1   Title independent with initial HEP   Status Achieved           PT Long Term Goals - 10/30/15 1609    PT LONG TERM GOAL #1   Title increase left shoulder flexion to 140 degrees   Status On-going   PT LONG TERM GOAL #2   Title report pain decreased 25%   Status On-going   PT LONG TERM GOAL #3   Title report no difficulty with doing  hair or dressing   Status On-going               Plan - 10/30/15 1605    Clinical Impression Statement All AROM taken in supine, Pt has increase his AROM in all planes. Continues to have R shoulder elevation with active shoulder flexion and abduction. Completed all other exercises well. Hard for pt to relax  during MT  despite cues.  Some motion gain with MT as muscle fatigue.    Rehab Potential Good   PT Frequency 2x / week   PT Duration 8 weeks   PT Treatment/Interventions ADLs/Self Care Home Management;Cryotherapy;Electrical Stimulation;Therapeutic exercise;Therapeutic activities;Ultrasound;Neuromuscular re-education;Patient/family education;Manual techniques;Taping;Passive range of motion   PT Next Visit Plan 6 week protocall, gain ROM      Patient will benefit from skilled therapeutic intervention in order to improve the following  deficits and impairments:  Decreased range of motion, Decreased strength, Increased fascial restricitons, Increased muscle spasms, Postural dysfunction, Improper body mechanics, Pain, Impaired UE functional use  Visit Diagnosis: Pain in left shoulder  Stiffness of left shoulder, not elsewhere classified  Localized swelling, mass and lump, left upper limb     Problem List There are no active problems to display for this patient.   Grayce Sessions, PTA 10/30/2015, 4:10 PM  [facility] [address] [place], [place], [address] Phone: [phone]   Fax:  [phone]  Name: Chase Gonzalez MRN: [id number] Date of Birth: 07/15/1960

## 2015-11-01 ENCOUNTER — Encounter: Payer: Self-pay | Admitting: Physical Therapy

## 2015-11-01 ENCOUNTER — Ambulatory Visit: Payer: BLUE CROSS/BLUE SHIELD | Admitting: Physical Therapy

## 2015-11-01 DIAGNOSIS — M25612 Stiffness of left shoulder, not elsewhere classified: Secondary | ICD-10-CM

## 2015-11-01 DIAGNOSIS — M25512 Pain in left shoulder: Secondary | ICD-10-CM

## 2015-11-01 DIAGNOSIS — R2232 Localized swelling, mass and lump, left upper limb: Secondary | ICD-10-CM

## 2015-11-01 NOTE — Therapy (Signed)
[facility] [address] [place], [place], [address] Phone: [phone]   Fax:  [phone]  Physical Therapy Treatment  Patient Details  Name: Chase Gonzalez MRN: [id number] Date of Birth: 1960-10-19 Referring Provider: Gust BroomsKen Lennon  Encounter Date: 11/01/2015      PT End of Session - 11/01/15 1554    Visit Number 6   Date for PT Re-Evaluation 12/11/15   PT Start Time 1515   PT Stop Time 1610   PT Time Calculation (min) 55 min   Activity Tolerance Patient tolerated treatment well   Behavior During Therapy [facility] for tasks assessed/performed      History reviewed. No pertinent past medical history.  History reviewed. No pertinent past surgical history.  There were no vitals filed for this visit.      Subjective Assessment - 11/01/15 1517    Subjective "Going good"   Currently in Pain? No/denies   Pain Score 0-No pain                         [facility] Adult PT Treatment/Exercise - 11/01/15 0001    Shoulder Exercises: Standing   External Rotation 15 reps;Theraband   Theraband Level (Shoulder External Rotation) Level 2 (Red)   Internal Rotation Left;15 reps;Theraband   Theraband Level (Shoulder Internal Rotation) Level 2 (Red)   Extension 20 reps;Theraband   Theraband Level (Shoulder Extension) Level 3 (Green)   Other Standing Exercises Ladder flex & abd X10    Shoulder Exercises: ROM/Strengthening   UBE (Upper Arm Bike) L2 103frd/3rev   Vasopneumatic   Number Minutes Vasopneumatic  15 minutes   Vasopnuematic Location  Shoulder   Vasopneumatic Pressure Medium   Vasopneumatic Temperature  34   Manual Therapy   Manual Therapy Joint mobilization;Passive ROM   Manual therapy comments pt guarded    Joint Mobilization grades 2 & 3   Passive ROM PROM taken to end range and held multiple times                  PT Short Term Goals - 10/17/15 1557    PT SHORT TERM GOAL #1   Title independent  with initial HEP   Status Achieved           PT Long Term Goals - 10/30/15 1609    PT LONG TERM GOAL #1   Title increase left shoulder flexion to 140 degrees   Status On-going   PT LONG TERM GOAL #2   Title report pain decreased 25%   Status On-going   PT LONG TERM GOAL #3   Title report no difficulty with doing hair or dressing   Status On-going               Plan - 11/01/15 1555    Clinical Impression Statement Heavy emphasis on MT this treatment date. Pt has good PROM but lacks ability to reach the ranges on his own. Pain with MT at end ranges. Pt able to do all the exercises well but little ROM achieved with resisted ER.   Rehab Potential Good   PT Frequency 2x / week   PT Duration 8 weeks   PT Treatment/Interventions ADLs/Self Care Home Management;Cryotherapy;Electrical Stimulation;Therapeutic exercise;Therapeutic activities;Ultrasound;Neuromuscular re-education;Patient/family education;Manual techniques;Taping;Passive range of motion   PT Next Visit Plan 6 week protocol, gain ROM      Patient will benefit from skilled therapeutic intervention in order to improve the following deficits and impairments:  Decreased range of motion, Decreased strength, Increased fascial restricitons, Increased muscle spasms, Postural dysfunction, Improper body mechanics, Pain, Impaired UE functional use  Visit Diagnosis: Stiffness of left shoulder, not elsewhere classified  Localized swelling, mass and lump, left upper limb  Pain in left shoulder     Problem List There are no active problems to display for this patient.   Grayce Sessions, PTA  11/01/2015, 4:00 PM  [facility] [address] [place], [place], [address] Phone: [phone]   Fax:  [phone]  Name: Chase Gonzalez MRN: [id number] Date of Birth: 1961/03/20

## 2015-11-06 ENCOUNTER — Ambulatory Visit: Payer: BLUE CROSS/BLUE SHIELD | Admitting: Physical Therapy

## 2015-11-06 ENCOUNTER — Encounter: Payer: Self-pay | Admitting: Physical Therapy

## 2015-11-06 DIAGNOSIS — M25612 Stiffness of left shoulder, not elsewhere classified: Secondary | ICD-10-CM

## 2015-11-06 DIAGNOSIS — M25512 Pain in left shoulder: Secondary | ICD-10-CM

## 2015-11-06 DIAGNOSIS — R2232 Localized swelling, mass and lump, left upper limb: Secondary | ICD-10-CM

## 2015-11-06 NOTE — Therapy (Signed)
[facility] [address] [place], [place], [address] Phone: [phone]   Fax:  [phone]  Physical Therapy Treatment  Patient Details  Name: Rogers Blockerric Cervenka MRN: [id number] Date of Birth: 1960/12/10 Referring Provider: Gust BroomsKen Lennon  Encounter Date: 11/06/2015      PT End of Session - 11/06/15 1357    Visit Number 7   PT Start Time 1315   PT Stop Time 1411   PT Time Calculation (min) 56 min   Activity Tolerance Patient tolerated treatment well   Behavior During Therapy [facility] for tasks assessed/performed      History reviewed. No pertinent past medical history.  History reviewed. No pertinent past surgical history.  There were no vitals filed for this visit.      Subjective Assessment - 11/06/15 1317    Subjective "Going good"   Currently in Pain? No/denies   Pain Score 0-No pain                         [facility] Adult PT Treatment/Exercise - 11/06/15 0001    Shoulder Exercises: Seated   Row Weights;15 reps  x2   Row Weight (lbs) 35   Other Seated Exercises lats #35 2x15    Shoulder Exercises: Standing   External Rotation 15 reps;Theraband  x2   Theraband Level (Shoulder External Rotation) Level 3 (Green)   Internal Rotation Left;15 reps;Theraband  x2   Theraband Level (Shoulder Internal Rotation) Level 3 (Green)   Other Standing Exercises Ladder flex & abd X10    Other Standing Exercises Standing straight arm pull downs #35 2x15    Shoulder Exercises: ROM/Strengthening   UBE (Upper Arm Bike) L3 673frd/3rev   Modalities   Modalities Electrical Stimulation;Cryotherapy   Programme researcher, broadcasting/film/videolectrical Stimulation   Electrical Stimulation Location L shoulder    Electrical Stimulation Action IFC   Electrical Stimulation Parameters tolerance   Electrical Stimulation Goals Pain   Vasopneumatic   Number Minutes Vasopneumatic  15 minutes   Vasopnuematic Location  Shoulder   Vasopneumatic Pressure Medium   Vasopneumatic Temperature  34   Manual Therapy   Manual Therapy Joint mobilization;Passive ROM   Manual therapy comments pt guarded    Joint Mobilization grades 2 & 3   Passive ROM PROM taken to end range and held multiple times                  PT Short Term Goals - 10/17/15 1557    PT SHORT TERM GOAL #1   Title independent with initial HEP   Status Achieved           PT Long Term Goals - 10/30/15 1609    PT LONG TERM GOAL #1   Title increase left shoulder flexion to 140 degrees   Status On-going   PT LONG TERM GOAL #2   Title report pain decreased 25%   Status On-going   PT LONG TERM GOAL #3   Title report no difficulty with doing hair or dressing   Status On-going               Plan - 11/06/15 1357    Clinical Impression Statement Pt does well with all exercises this date, shoulder elevation remain with active shoulder flexion and abduction. Pt remains guarded at times with MT, but tends to relay when PROM taken to end range and held a few seconds.    Rehab Potential Good   PT Frequency 2x /  week   PT Duration 8 weeks   PT Treatment/Interventions ADLs/Self Care Home Management;Cryotherapy;Electrical Stimulation;Therapeutic exercise;Therapeutic activities;Ultrasound;Neuromuscular re-education;Patient/family education;Manual techniques;Taping;Passive range of motion   PT Next Visit Plan 6 week protocall, gain ROM      Patient will benefit from skilled therapeutic intervention in order to improve the following deficits and impairments:  Decreased range of motion, Decreased strength, Increased fascial restricitons, Increased muscle spasms, Postural dysfunction, Improper body mechanics, Pain, Impaired UE functional use  Visit Diagnosis: Stiffness of left shoulder, not elsewhere classified  Localized swelling, mass and lump, left upper limb  Pain in left shoulder     Problem List There are no active problems to display for this patient.   Grayce Sessions, PTA  11/06/2015, 2:00 PM  [facility] [address] [place], [place], [address] Phone: [phone]   Fax:  [phone]  Name: Kebin Maye MRN: [id number] Date of Birth: Feb 07, 1961

## 2015-11-08 ENCOUNTER — Ambulatory Visit: Payer: BLUE CROSS/BLUE SHIELD | Admitting: Physical Therapy

## 2015-11-08 ENCOUNTER — Encounter: Payer: Self-pay | Admitting: Physical Therapy

## 2015-11-08 DIAGNOSIS — R2232 Localized swelling, mass and lump, left upper limb: Secondary | ICD-10-CM

## 2015-11-08 DIAGNOSIS — M25612 Stiffness of left shoulder, not elsewhere classified: Secondary | ICD-10-CM

## 2015-11-08 DIAGNOSIS — M25512 Pain in left shoulder: Secondary | ICD-10-CM | POA: Diagnosis not present

## 2015-11-08 NOTE — Therapy (Signed)
[facility] [address] [place], [place], [address] Phone: [phone]   Fax:  [phone]  Physical Therapy Treatment  Patient Details  Name: Chase Gonzalez MRN: [id number] Date of Birth: 22-Feb-1961 Referring Provider: Gust Brooms  Encounter Date: 11/08/2015      PT End of Session - 11/08/15 1509    Visit Number 8   Date for PT Re-Evaluation 12/11/15   PT Start Time 1430   PT Stop Time 1524   PT Time Calculation (min) 54 min   Activity Tolerance Patient tolerated treatment well   Behavior During Therapy [facility] for tasks assessed/performed      History reviewed. No pertinent past medical history.  History reviewed. No pertinent past surgical history.  There were no vitals filed for this visit.      Subjective Assessment - 11/08/15 1432    Subjective "Im doing good"   Currently in Pain? No/denies   Pain Score 0-No pain                         [facility] Adult PT Treatment/Exercise - 11/08/15 0001    Shoulder Exercises: Seated   Other Seated Exercises lats #35 2x15    Other Seated Exercises Seated high low rows & face pulls #35 2x15   Shoulder Exercises: Standing   Other Standing Exercises Ladder flex & abd X10    Other Standing Exercises Standing straight arm pull downs #35 2x15    Shoulder Exercises: ROM/Strengthening   UBE (Upper Arm Bike) L3 81frd/3rev   Vasopneumatic   Number Minutes Vasopneumatic  15 minutes   Vasopnuematic Location  Shoulder   Vasopneumatic Pressure Medium   Vasopneumatic Temperature  34   Manual Therapy   Manual Therapy Joint mobilization;Passive ROM   Manual therapy comments pt guarded    Joint Mobilization grades 2 & 3   Passive ROM PROM taken to end range and held multiple times                  PT Short Term Goals - 10/17/15 1557    PT SHORT TERM GOAL #1   Title independent with initial HEP   Status Achieved           PT Long Term Goals -  10/30/15 1609    PT LONG TERM GOAL #1   Title increase left shoulder flexion to 140 degrees   Status On-going   PT LONG TERM GOAL #2   Title report pain decreased 25%   Status On-going   PT LONG TERM GOAL #3   Title report no difficulty with doing hair or dressing   Status On-going               Plan - 11/08/15 1510    Clinical Impression Statement Pt with less guarding with MT and motion appears to have improved passively. Pt able to perform and complete all exercises well. Pt does demo weakness with seated face pulls with little pain.   Rehab Potential Good   PT Frequency 2x / week   PT Duration 8 weeks   PT Treatment/Interventions ADLs/Self Care Home Management;Cryotherapy;Electrical Stimulation;Therapeutic exercise;Therapeutic activities;Ultrasound;Neuromuscular re-education;Patient/family education;Manual techniques;Taping;Passive range of motion   PT Next Visit Plan 6 week protocall, gain ROM      Patient will benefit from skilled therapeutic intervention in order to improve the following deficits and impairments:  Decreased range of motion, Decreased strength, Increased fascial restricitons, Increased muscle spasms, Postural  dysfunction, Improper body mechanics, Pain, Impaired UE functional use  Visit Diagnosis: Stiffness of left shoulder, not elsewhere classified  Pain in left shoulder  Localized swelling, mass and lump, left upper limb     Problem List There are no active problems to display for this patient.   Grayce Sessionsonald G Pemberton, PTA 11/08/2015, 3:13 PM  [facility] [address] [place], [place], [address] Phone: [phone]   Fax:  [phone]  Name: Chase Gonzalez MRN: [id number] Date of Birth: [birth year]

## 2015-11-13 ENCOUNTER — Ambulatory Visit: Payer: BLUE CROSS/BLUE SHIELD | Admitting: Physical Therapy

## 2015-11-13 ENCOUNTER — Encounter: Payer: Self-pay | Admitting: Physical Therapy

## 2015-11-13 DIAGNOSIS — R2232 Localized swelling, mass and lump, left upper limb: Secondary | ICD-10-CM

## 2015-11-13 DIAGNOSIS — M25612 Stiffness of left shoulder, not elsewhere classified: Secondary | ICD-10-CM

## 2015-11-13 DIAGNOSIS — M25512 Pain in left shoulder: Secondary | ICD-10-CM

## 2015-11-13 NOTE — Therapy (Signed)
[facility] [address] [place], [place], [address] Phone: [phone]   Fax:  [phone]  Physical Therapy Treatment  Patient Details  Name: Chase Gonzalez MRN: [id number] Date of Birth: 1960/10/13 Referring Provider: Gust BroomsKen Lennon  Encounter Date: 11/13/2015      PT End of Session - 11/13/15 1647    Visit Number 9   Date for PT Re-Evaluation 12/11/15   PT Start Time 1602   PT Stop Time 1700   PT Time Calculation (min) 58 min   Activity Tolerance Patient tolerated treatment well   Behavior During Therapy [facility] for tasks assessed/performed      History reviewed. No pertinent past medical history.  History reviewed. No pertinent past surgical history.  There were no vitals filed for this visit.      Subjective Assessment - 11/13/15 1610    Subjective Pt reports that things are going good, but he was a little unsure about he recent doctors visit.    Currently in Pain? No/denies   Pain Score 0-No pain                         [facility] Adult PT Treatment/Exercise - 11/13/15 0001    Shoulder Exercises: Seated   Other Seated Exercises lats #20 2x15 LLE only    Other Seated Exercises Seated high low rows & face pulls #35 2x15   Shoulder Exercises: Standing   External Rotation 15 reps;Weights;Strengthening;Left   External Rotation Weight (lbs) 5   Internal Rotation Weights;20 reps;Left;Strengthening  x2   Internal Rotation Weight (lbs) 5   Extension Left;15 reps;Weights  x2   Extension Weight (lbs) 15   Row Left;15 reps;Weights  x2   Row Weight (lbs) 25   Other Standing Exercises Ladder flex & abd X15   Shoulder Exercises: ROM/Strengthening   UBE (Upper Arm Bike) L3 63frd/3rev   Vasopneumatic   Number Minutes Vasopneumatic  15 minutes   Vasopnuematic Location  Shoulder   Vasopneumatic Pressure Medium   Vasopneumatic Temperature  34                  PT Short Term Goals - 10/17/15  1557    PT SHORT TERM GOAL #1   Title independent with initial HEP   Status Achieved           PT Long Term Goals - 10/30/15 1609    PT LONG TERM GOAL #1   Title increase left shoulder flexion to 140 degrees   Status On-going   PT LONG TERM GOAL #2   Title report pain decreased 25%   Status On-going   PT LONG TERM GOAL #3   Title report no difficulty with doing hair or dressing   Status On-going               Plan - 11/13/15 1647    Clinical Impression Statement Pt continues with good R shoulder PROM. Difficulty with keeping L shoulder down with active shoulder flexion and abduction. No issues with isolated L shoulder interventions. During L shoulder ER it appears that pt may have ruptured his L bicep tendon. When asked about the bulge in the L arm pt reported that it wasn't there before surgery. Pt  also stated that he did feel a pop in that area 2 weeks post surgery when he was pulling his pants up. Pt advises  to return to MD for further assessment.   Rehab Potential  Good   PT Frequency 2x / week   PT Duration 8 weeks   PT Treatment/Interventions ADLs/Self Care Home Management;Cryotherapy;Electrical Stimulation;Therapeutic exercise;Therapeutic activities;Ultrasound;Neuromuscular re-education;Patient/family education;Manual techniques;Taping;Passive range of motion   PT Next Visit Plan 6 week protocall, gain ROM      Patient will benefit from skilled therapeutic intervention in order to improve the following deficits and impairments:  Decreased range of motion, Decreased strength, Increased fascial restricitons, Increased muscle spasms, Postural dysfunction, Improper body mechanics, Pain, Impaired UE functional use  Visit Diagnosis: Stiffness of left shoulder, not elsewhere classified  Pain in left shoulder  Localized swelling, mass and lump, left upper limb     Problem List There are no active problems to display for this patient.   Grayce Sessions,  PTA 11/13/2015, 4:51 PM  [facility] [place] [facility] [address] [place], [place], [address] Phone: [phone]   Fax:  [phone]  Name: Chase Gonzalez MRN: [id number] Date of Birth: 03/21/1961

## 2015-11-15 ENCOUNTER — Encounter: Payer: Self-pay | Admitting: Physical Therapy

## 2015-11-15 ENCOUNTER — Ambulatory Visit: Payer: BLUE CROSS/BLUE SHIELD | Attending: Sports Medicine | Admitting: Physical Therapy

## 2015-11-15 DIAGNOSIS — M25612 Stiffness of left shoulder, not elsewhere classified: Secondary | ICD-10-CM | POA: Insufficient documentation

## 2015-11-15 DIAGNOSIS — R2232 Localized swelling, mass and lump, left upper limb: Secondary | ICD-10-CM | POA: Insufficient documentation

## 2015-11-15 DIAGNOSIS — M25512 Pain in left shoulder: Secondary | ICD-10-CM | POA: Diagnosis present

## 2015-11-15 NOTE — Therapy (Signed)
[facility] [address] [place], [place], [address] Phone: [phone]   Fax:  [phone]  Physical Therapy Treatment  Patient Details  Name: Chase Blockerric Albaugh MRN: [id number] Date of Birth: Feb 15, 1961 Referring Provider: Gust BroomsKen Lennon  Encounter Date: 11/15/2015      PT End of Session - 11/15/15 1644    Visit Number 10   Date for PT Re-Evaluation 12/11/15   PT Start Time 1602   PT Stop Time 1659   PT Time Calculation (min) 57 min   Activity Tolerance Patient tolerated treatment well   Behavior During Therapy [facility] for tasks assessed/performed      History reviewed. No pertinent past medical history.  History reviewed. No pertinent past surgical history.  There were no vitals filed for this visit.      Subjective Assessment - 11/15/15 1610    Subjective Pt reports that he was able to get a MD appointmetn June 19 to have his bicep looked at. "Everything going good."   Currently in Pain? No/denies   Pain Score 0-No pain                         [facility] Adult PT Treatment/Exercise - 11/15/15 0001    Shoulder Exercises: Seated   Other Seated Exercises Bent over fly's #3 2x15    Shoulder Exercises: Standing   External Rotation 15 reps;Strengthening;Left;Theraband  x2   Theraband Level (Shoulder External Rotation) Level 1 (Yellow)   Internal Rotation Weights;Left;Strengthening;15 reps  x2   Internal Rotation Weight (lbs) 5   Extension Left;15 reps;Weights   Extension Weight (lbs) 15   Row Left;15 reps;Weights  x2   Row Weight (lbs) 25   Other Standing Exercises Ladder flex & abd X15   Other Standing Exercises Flex & abd up wall with billow case 2x10, L shoulder abd & flex to 90 with 3 sec isometric holds x10   Shoulder Exercises: ROM/Strengthening   UBE (Upper Arm Bike) L4 483frd/3rev   Vasopneumatic   Number Minutes Vasopneumatic  15 minutes   Vasopnuematic Location  Shoulder   Vasopneumatic  Pressure Medium   Vasopneumatic Temperature  34                  PT Short Term Goals - 10/17/15 1557    PT SHORT TERM GOAL #1   Title independent with initial HEP   Status Achieved           PT Long Term Goals - 10/30/15 1609    PT LONG TERM GOAL #1   Title increase left shoulder flexion to 140 degrees   Status On-going   PT LONG TERM GOAL #2   Title report pain decreased 25%   Status On-going   PT LONG TERM GOAL #3   Title report no difficulty with doing hair or dressing   Status On-going               Plan - 11/15/15 1644    Clinical Impression Statement Pt give good effort with therapeutic exercises. Pt remain weak with L shoulder ER, unable to reach full ROM under resistance. Therapist placed pt L shoulder in position difficulty holding R shoulder in 90 degrees flexion and abduction ounce therapist started to released.   Rehab Potential Good   PT Frequency 2x / week   PT Duration 8 weeks   PT Treatment/Interventions ADLs/Self Care Home Management;Cryotherapy;Electrical Stimulation;Therapeutic exercise;Therapeutic activities;Ultrasound;Neuromuscular re-education;Patient/family education;Manual techniques;Taping;Passive range of  motion   PT Next Visit Plan 6 week protocall, gain ROM      Patient will benefit from skilled therapeutic intervention in order to improve the following deficits and impairments:  Decreased range of motion, Decreased strength, Increased fascial restricitons, Increased muscle spasms, Postural dysfunction, Improper body mechanics, Pain, Impaired UE functional use  Visit Diagnosis: Stiffness of left shoulder, not elsewhere classified  Pain in left shoulder  Localized swelling, mass and lump, left upper limb     Problem List There are no active problems to display for this patient.   Grayce Sessions, PTA 11/15/2015, 4:47 PM  [facility] [place] [facility] [address] [place], [place], [address] Phone: [phone]   Fax:  [phone]  Name: Chase Gonzalez MRN: [id number] Date of Birth: 1960-11-10

## 2015-11-20 ENCOUNTER — Encounter: Payer: Self-pay | Admitting: Physical Therapy

## 2015-11-20 ENCOUNTER — Ambulatory Visit: Payer: BLUE CROSS/BLUE SHIELD | Admitting: Physical Therapy

## 2015-11-20 DIAGNOSIS — M25612 Stiffness of left shoulder, not elsewhere classified: Secondary | ICD-10-CM

## 2015-11-20 DIAGNOSIS — M25512 Pain in left shoulder: Secondary | ICD-10-CM

## 2015-11-20 NOTE — Therapy (Addendum)
[address] [place], [place], [address] Phone: [phone]   Fax:  [phone]  Physical Therapy Treatment  Patient Details  Name: Chase Gonzalez MRN: [id number] Date of Birth: August 25, 1960 Referring Provider: Judeth Cornfield  Encounter Date: 11/20/2015      PT End of Session - 11/20/15 1743    Visit Number 11   Date for PT Re-Evaluation 12/11/15   PT Start Time 5597   PT Stop Time 1744   PT Time Calculation (min) 51 min   Activity Tolerance Patient tolerated treatment well   Behavior During Therapy [facility] for tasks assessed/performed      History reviewed. No pertinent past medical history.  History reviewed. No pertinent past surgical history.  There were no vitals filed for this visit.      Subjective Assessment - 11/20/15 1656    Subjective Everything is good.   no problems.  No pain, sleeping better.   Currently in Pain? No/denies                         [facility] Adult PT Treatment/Exercise - 11/20/15 0001    Self-Care   Self-Care Other Self-Care Comments   Other Self-Care Comments  worked a lot with patient on exercises to do at home to work in the weakest part of his arc of motions, in standing using the wall and in supine with gravity lessened planes of motions, performed an educated on eccentric mm exercises and had him perform   Shoulder Exercises: Supine   External Rotation Strengthening;20 reps;Weights   External Rotation Weight (lbs) 1   Internal Rotation 20 reps;Weights;Strengthening   Internal Rotation Weight (lbs) 1   Flexion AROM;Strengthening;20 reps;Weights   Shoulder Flexion Weight (lbs) 1   Other Supine Exercises serratus push with no weight and with 1#   Other Supine Exercises supine isometric circles and diagonal motions working on control in small range   Shoulder Exercises: Sidelying   External Rotation Strengthening;20 reps;Weights   External Rotation Weight (lbs)  no weight and 1#   External Rotation Limitations had to have assist to get to end Range and then worked Environmental consultant Exercises: Standing   Other Standing Exercises overhead press in the last 40 degrees of flexion with cane   Shoulder Exercises: Body Blade   External Rotation 30 seconds   Internal Rotation 30 seconds                  PT Short Term Goals - 10/17/15 1557    PT SHORT TERM GOAL #1   Title independent with initial HEP   Status Achieved           PT Long Term Goals - 11/20/15 1750    PT LONG TERM GOAL #1   Title increase left shoulder flexion to 140 degrees   Status On-going   PT LONG TERM GOAL #2   Title report pain decreased 25%   Status Achieved   PT LONG TERM GOAL #3   Title report no difficulty with doing hair or dressing   Status On-going   PT LONG TERM GOAL #4   Title lift 5# to shoulder height   Status On-going               Plan - 11/20/15 1746    Clinical Impression Statement Patient has a very weak arc of motion from 80 degrees flexion and up to about 120  degrees.  Below this plane of motion his strength is pretty good, he has a positive drop arm on the right, I can palpate the supraspinatus and deltoid working.  I am unsure if he has a tear or if he has compensated for so long that he is having difficulty getting the mms to work correctly.  He has limitation to his insurance visits and has a possible biceps tear.  I gave him numerous new exercises today that were supine/AAROM/isometric and eccentric in nature to see if we can strengthen the weak arc of motion.   PT Next Visit Plan I had him cancel his next appointment with [place], start the new exercises we went over today, he will consult with MD next week.   Consulted and Agree with Plan of Care Patient      Patient will benefit from skilled therapeutic intervention in order to improve the following deficits and impairments:  Decreased range of motion, Decreased strength,  Increased fascial restricitons, Increased muscle spasms, Postural dysfunction, Improper body mechanics, Pain, Impaired UE functional use  Visit Diagnosis: Stiffness of left shoulder, not elsewhere classified  Pain in left shoulder     Problem List There are no active problems to display for this patient.   Sumner Boast., PT 11/20/2015, 5:51 PM  [facility] [address] [place], [place], [address] Phone: [phone]   Fax:  [phone]  Name: Chase Gonzalez MRN: [id number] Date of Birth: 1961-04-01    PHYSICAL THERAPY DISCHARGE SUMMARY  V  Plan: Patient agrees to discharge.  Patient goals were partially met. Patient is being discharged due to lack of progress.  ?????

## 2015-11-22 ENCOUNTER — Ambulatory Visit: Payer: BLUE CROSS/BLUE SHIELD | Admitting: Physical Therapy

## 2016-06-16 HISTORY — PX: OTHER SURGICAL HISTORY: SHX169

## 2017-01-15 ENCOUNTER — Ambulatory Visit (INDEPENDENT_AMBULATORY_CARE_PROVIDER_SITE_OTHER): Payer: BLUE CROSS/BLUE SHIELD | Admitting: Orthopaedic Surgery

## 2017-01-15 ENCOUNTER — Ambulatory Visit (INDEPENDENT_AMBULATORY_CARE_PROVIDER_SITE_OTHER): Payer: BLUE CROSS/BLUE SHIELD

## 2017-01-15 DIAGNOSIS — M25552 Pain in left hip: Secondary | ICD-10-CM | POA: Diagnosis not present

## 2017-01-15 DIAGNOSIS — M7062 Trochanteric bursitis, left hip: Secondary | ICD-10-CM | POA: Diagnosis not present

## 2017-01-15 DIAGNOSIS — M5442 Lumbago with sciatica, left side: Secondary | ICD-10-CM

## 2017-01-15 DIAGNOSIS — G8929 Other chronic pain: Secondary | ICD-10-CM | POA: Diagnosis not present

## 2017-01-15 MED ORDER — LIDOCAINE HCL 1 % IJ SOLN
3.0000 mL | INTRAMUSCULAR | Status: AC | PRN
  Administered 2017-01-15: 3 mL

## 2017-01-15 MED ORDER — METHYLPREDNISOLONE ACETATE 40 MG/ML IJ SUSP
40.0000 mg | INTRAMUSCULAR | Status: AC | PRN
  Administered 2017-01-15: 40 mg via INTRA_ARTICULAR

## 2017-01-15 NOTE — Progress Notes (Signed)
Office Visit Note   Patient: Chase Gonzalez           Date of Birth: [birth year]           MRN: [id number] Visit Date: 01/15/2017              Requested by: No referring provider defined for this encounter. PCP: [name], [name], MD   Assessment & Plan: Visit Diagnoses:  1. Chronic left-sided low back pain with left-sided sciatica   2. Pain in left hip   3. Trochanteric bursitis, left hip     Plan: I do feel this is trochanteric bursitis of his hip and I spent some time with and explained the anatomy and show him in detail what this involves. I showed him stretching exercises for him and he was able to demonstrate is back to me. I talked about trying a steroid injection in this area and he is agreeable to this. He tolerated the injection well. He'll follow up as needed no chronically on that side at night slowly returned his exercise activities. I told him I can always try another injection 3 months and the other thing to consider would be outpatient physical therapy. All questions were addressed and answered.  Follow-Up Instructions: Return if symptoms worsen or fail to improve.   Orders:  Orders Placed This Encounter  Procedures  . Large Joint Injection/Arthrocentesis  . XR HIP UNILAT W OR W/O PELVIS 1V LEFT  . XR Lumbar Spine 2-3 Views   No orders of the defined types were placed in this encounter.     Procedures: Large Joint Inj Date/Time: 01/15/2017 1:23 PM Performed by: Kathryne HitchBLACKMAN, CHRISTOPHER Y Authorized by: Kathryne HitchBLACKMAN, CHRISTOPHER Y   Location:  Hip Site:  L greater trochanter Ultrasound Guidance: No   Fluoroscopic Guidance: No   Arthrogram: No   Medications:  3 mL lidocaine 1 %; 40 mg methylPREDNISolone acetate 40 MG/ML     Clinical Data: No additional findings.   Subjective: No chief complaint on file. A shows new patient for me today. He comes in with history of left hip pain is worse in about 6 months now. He is actually several months out from a left total  knee arthroplasty done by another physician in town on that left side. He says sleeping on his hip at night gives a lot of problems going to gym he cannot use the elliptical. He points the trochanteric areas source of his pain. He denies any groin pain. He denies any sciatic symptoms although he has had back surgery in the past. He denies any trauma to that hip area.  HPI  Review of Systems He denies any headache, chest pain, shortness of breath, fever, chills, nausea, vomiting. He denies any weakness in his legs.  Objective: Vital Signs: There were no vitals taken for this visit.  Physical Exam Examination of his left lower extremity shows pain only to palpation over the trochanteric area. He has full range of motion of his hip and knee and his left hip has no pain in the groin at all. He's got excellent strength in his bilateral lower extremities and normal sensation in his feet as well. Of note is alert or 3. Ortho Exam  Specialty Comments:  No specialty comments available.  Imaging: Xr Hip Unilat W Or W/o Pelvis 1v Left  Result Date: 01/15/2017 An AP pelvis and lateral of his left hip show no significant arthritic changes in the hip itself no acute findings.  Xr Lumbar Spine 2-3  Views  Result Date: 01/15/2017 An AP and lateral lumbar spine show severe degenerative disc disease at L5-S1 these had previous surgery in this area as well but no hardware.    PMFS History: Patient Active Problem List   Diagnosis Date Noted  . Trochanteric bursitis, left hip 01/15/2017   No past medical history on file.  No family history on file.  No past surgical history on file. Social History   Occupational History  . Not on file.   Social History Main Topics  . Smoking status: Not on file  . Smokeless tobacco: Not on file  . Alcohol use Not on file  . Drug use: Unknown  . Sexual activity: Not on file

## 2017-02-26 HISTORY — PX: OTHER SURGICAL HISTORY: SHX169

## 2017-04-20 ENCOUNTER — Ambulatory Visit (INDEPENDENT_AMBULATORY_CARE_PROVIDER_SITE_OTHER): Payer: BLUE CROSS/BLUE SHIELD | Admitting: Orthopaedic Surgery

## 2017-04-20 DIAGNOSIS — M7062 Trochanteric bursitis, left hip: Secondary | ICD-10-CM

## 2017-04-20 MED ORDER — METHYLPREDNISOLONE ACETATE 40 MG/ML IJ SUSP
80.0000 mg | INTRAMUSCULAR | Status: AC | PRN
  Administered 2017-04-20: 80 mg

## 2017-04-20 MED ORDER — LIDOCAINE HCL 1 % IJ SOLN
3.0000 mL | INTRAMUSCULAR | Status: AC | PRN
  Administered 2017-04-20: 3 mL

## 2017-04-20 NOTE — Progress Notes (Signed)
   Office Visit Note   Patient: Chase Gonzalez           Date of Birth: 1961/05/06           MRN: [id number] Visit Date: 04/20/2017              Requested by: [name], [name], MD [id number] [facility] [address] [place], [place] [address] PCP: [name], [name], MD   Assessment & Plan: Visit Diagnoses:  1. Trochanteric bursitis, left hip     Plan: Per his request I agree with trying a steroid injection of the trochanteric area.  He will continue stretching exercises as well.  He understands the risk and benefits of injections as well having had this before and explained to him in the past.  He tolerated the injection well.  No other option at this point would be physical therapy and he will let us know if he like to have that done.  He knows to wait at least 3-4 months between injections.  He will follow-up as needed otherwise.  Follow-Up Instructions: Return if symptoms worsen or fail to improve.   Orders:  Orders Placed This Encounter  Procedures  . Large Joint Inj  . Large Joint Inj   No orders of the defined types were placed in this encounter.     Procedures: Large Joint Inj: L greater trochanter on 04/20/2017 8:42 AM Medications: 3 mL lidocaine 1 %; 80 mg methylPREDNISolone acetate 40 MG/ML      Clinical Data: No additional findings.   Subjective: Chief Complaint  Patient presents with  . Left Hip - Pain   The patient is well-known to me.  He has a history of left hip trochanteric bursitis.  His gait is been thrown off from back surgery as well as the knee replaced in the left side.  He had a successful trochanteric injection in early August.  He like to have one again today.  He points only trochanteric area as the source of his pain.  He denies any pain in the groin.  We have x-rayed his left hip in the past that showed no significant arthritic changes.  He does work on stretching exercises that I had him try before. HPI  Review of Systems He currently denies any headache, chest pain, shortness  of breath, fever, chills, nausea, vomiting.  Objective: Vital Signs: There were no vitals taken for this visit.  Physical Exam He is alert and oriented x3 and in no acute distress  Ortho Exam Examination of his left hip shows fluid range of motion of the hip with no pain going all.  He only has pain over the trochanteric area to palpation and the iliotibial band. Specialty Comments:  No specialty comments available.  Imaging: No results found.   PMFS History: Patient Active Problem List   Diagnosis Date Noted  . Trochanteric bursitis, left hip 01/15/2017   No past medical history on file.  No family history on file.  No past surgical history on file. Social History   Occupational History  . Not on file  Tobacco Use  . Smoking status: Not on file  Substance and Sexual Activity  . Alcohol use: Not on file  . Drug use: Not on file  . Sexual activity: Not on file

## 2017-07-20 DIAGNOSIS — M4712 Other spondylosis with myelopathy, cervical region: Secondary | ICD-10-CM | POA: Diagnosis not present

## 2017-07-20 DIAGNOSIS — R252 Cramp and spasm: Secondary | ICD-10-CM | POA: Diagnosis not present

## 2017-07-20 DIAGNOSIS — M4722 Other spondylosis with radiculopathy, cervical region: Secondary | ICD-10-CM | POA: Diagnosis not present

## 2017-07-20 DIAGNOSIS — G9520 Unspecified cord compression: Secondary | ICD-10-CM | POA: Diagnosis not present

## 2017-07-28 DIAGNOSIS — M545 Low back pain: Secondary | ICD-10-CM | POA: Diagnosis not present

## 2017-07-28 DIAGNOSIS — M5416 Radiculopathy, lumbar region: Secondary | ICD-10-CM | POA: Diagnosis not present

## 2017-07-28 DIAGNOSIS — M542 Cervicalgia: Secondary | ICD-10-CM | POA: Diagnosis not present

## 2017-07-28 DIAGNOSIS — M5412 Radiculopathy, cervical region: Secondary | ICD-10-CM | POA: Diagnosis not present

## 2017-09-17 DIAGNOSIS — M4802 Spinal stenosis, cervical region: Secondary | ICD-10-CM | POA: Diagnosis not present

## 2017-09-22 DIAGNOSIS — M4802 Spinal stenosis, cervical region: Secondary | ICD-10-CM | POA: Diagnosis not present

## 2017-09-23 DIAGNOSIS — R030 Elevated blood-pressure reading, without diagnosis of hypertension: Secondary | ICD-10-CM | POA: Diagnosis not present

## 2017-09-23 DIAGNOSIS — M4802 Spinal stenosis, cervical region: Secondary | ICD-10-CM | POA: Diagnosis not present

## 2017-09-23 DIAGNOSIS — Z6829 Body mass index (BMI) 29.0-29.9, adult: Secondary | ICD-10-CM | POA: Diagnosis not present

## 2017-10-15 DIAGNOSIS — M50022 Cervical disc disorder at C5-C6 level with myelopathy: Secondary | ICD-10-CM | POA: Diagnosis not present

## 2017-10-15 DIAGNOSIS — M4802 Spinal stenosis, cervical region: Secondary | ICD-10-CM | POA: Diagnosis not present

## 2017-10-15 HISTORY — PX: ANTERIOR CERVICAL DECOMP/DISCECTOMY FUSION: SHX1161

## 2017-11-25 DIAGNOSIS — M4802 Spinal stenosis, cervical region: Secondary | ICD-10-CM | POA: Diagnosis not present

## 2017-12-15 DIAGNOSIS — Z0000 Encounter for general adult medical examination without abnormal findings: Secondary | ICD-10-CM | POA: Diagnosis not present

## 2017-12-15 DIAGNOSIS — M542 Cervicalgia: Secondary | ICD-10-CM | POA: Diagnosis not present

## 2017-12-15 DIAGNOSIS — M545 Low back pain: Secondary | ICD-10-CM | POA: Diagnosis not present

## 2017-12-15 DIAGNOSIS — R799 Abnormal finding of blood chemistry, unspecified: Secondary | ICD-10-CM | POA: Diagnosis not present

## 2017-12-15 DIAGNOSIS — E785 Hyperlipidemia, unspecified: Secondary | ICD-10-CM | POA: Diagnosis not present

## 2017-12-21 DIAGNOSIS — Z1210 Encounter for screening for malignant neoplasm of intestinal tract, unspecified: Secondary | ICD-10-CM | POA: Diagnosis not present

## 2017-12-24 DIAGNOSIS — R030 Elevated blood-pressure reading, without diagnosis of hypertension: Secondary | ICD-10-CM | POA: Diagnosis not present

## 2017-12-24 DIAGNOSIS — M48062 Spinal stenosis, lumbar region with neurogenic claudication: Secondary | ICD-10-CM | POA: Diagnosis not present

## 2017-12-24 DIAGNOSIS — M4317 Spondylolisthesis, lumbosacral region: Secondary | ICD-10-CM | POA: Diagnosis not present

## 2017-12-24 DIAGNOSIS — M4802 Spinal stenosis, cervical region: Secondary | ICD-10-CM | POA: Diagnosis not present

## 2017-12-31 DIAGNOSIS — M48062 Spinal stenosis, lumbar region with neurogenic claudication: Secondary | ICD-10-CM | POA: Diagnosis not present

## 2017-12-31 DIAGNOSIS — M4317 Spondylolisthesis, lumbosacral region: Secondary | ICD-10-CM | POA: Diagnosis not present

## 2017-12-31 DIAGNOSIS — Z6829 Body mass index (BMI) 29.0-29.9, adult: Secondary | ICD-10-CM | POA: Diagnosis not present

## 2018-01-07 ENCOUNTER — Ambulatory Visit: Payer: BLUE CROSS/BLUE SHIELD | Attending: Neurosurgery | Admitting: Physical Therapy

## 2018-01-07 ENCOUNTER — Encounter: Payer: Self-pay | Admitting: Physical Therapy

## 2018-01-07 ENCOUNTER — Other Ambulatory Visit: Payer: Self-pay

## 2018-01-07 DIAGNOSIS — M5442 Lumbago with sciatica, left side: Secondary | ICD-10-CM | POA: Diagnosis not present

## 2018-01-07 DIAGNOSIS — R262 Difficulty in walking, not elsewhere classified: Secondary | ICD-10-CM | POA: Diagnosis not present

## 2018-01-07 DIAGNOSIS — M62830 Muscle spasm of back: Secondary | ICD-10-CM | POA: Diagnosis not present

## 2018-01-07 DIAGNOSIS — M5441 Lumbago with sciatica, right side: Secondary | ICD-10-CM | POA: Insufficient documentation

## 2018-01-07 NOTE — Therapy (Signed)
[facility] [address] [place], [place], [address] Phone: [phone]   Fax:  [phone]  Physical Therapy Evaluation  Patient Details  Name: Chase Gonzalez MRN: [id number] Date of Birth: [birth year] Referring Provider: Jordan LikesPool   Encounter Date: 01/07/2018  PT End of Session - 01/07/18 1659    Visit Number  1    Date for PT Re-Evaluation  03/10/18    PT Start Time  1610    PT Stop Time  1715    PT Time Calculation (min)  65 min    Activity Tolerance  Patient tolerated treatment well    Behavior During Therapy  [facility] for tasks assessed/performed       History reviewed. No pertinent past medical history.  Past Surgical History:  Procedure Laterality Date  . ANTERIOR CERVICAL DECOMP/DISCECTOMY FUSION  10/15/2017   Dr. Jordan LikesPool  . left TKR Left 06/2016   Dr. Charlann Boxerlin  . posterior cervical disc fusion  02/26/2017  . THORACIC DISC SURGERY     2017, 2 ruptured discs, had emergency surgery    There were no vitals filed for this visit.   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  He reports in the past year he has had more and more LBP.  MD note reports spondylolisthesis.  He reports that over the past 6-8 months he has had difficulty walking, reports 2 falls due to legs giving out.  Reports some recent numbness in the toes on both feet    Limitations  Standing;Walking;Lifting;Sitting;House hold activities    How long can you sit comfortably?  any sitting causes difficulty with getting up and walking    How long can you walk comfortably?  50 steps then he gets very slow and left leg really stiff and difficulty with movements    Patient Stated Goals  have less pain    Currently in Pain?  Yes    Pain Score  2     Pain Location  Back    Pain Orientation  Lower;Left    Pain Descriptors / Indicators  Aching;Spasm;Tightness;Burning    Pain Type  Chronic pain    Pain  Radiating Towards  in the legs left more than the right, numbness in teh toes bilaterally    Pain Onset  More than a month ago    Pain Frequency  Constant    Aggravating Factors   stting, standing, bending, lifting squatting either can't do or pain is a 9/10    Pain Relieving Factors  heat helps some, rest, some pain medicine pain can be 1-2/10    Effect of Pain on Daily Activities  limits everything         [facility] - 01/07/18 0001      Assessment   Medical Diagnosis  LBP with spondylolisthesis    Referring Provider  Pool    Onset Date/Surgical Date  12/08/17    Prior Therapy  not for the back      Precautions   Precautions  None      Balance Screen   Has the patient fallen in the past 6 months  Yes    How many times?  2    Has the patient had a decrease in activity level because of a fear of falling?   No    Is the patient reluctant to leave their home because of a fear  of falling?   No      Home Environment   Additional Comments  reports that he has been unable to do yardwork like he was      Prior Function   Level of Independence  Independent    Vocation  Full time employment    Vocation Requirements  sitting at computer    Leisure  reports that he has tried some exercise but has difficulty       Posture/Postural Control   Posture Comments  fwd head, decreased lordosis      ROM / Strength   AROM / PROM / Strength  AROM;Strength      AROM   Overall AROM Comments  Lumbar ROM decreased 75% with pain in the low back and into the posterior legs      Strength   Overall Strength Comments  4/5 with some pain in the back,      Flexibility   Soft Tissue Assessment /Muscle Length  yes    Hamstrings  tight, SLR at 45 degrees pain on the left in the back    Quadriceps  very tight    ITB  very tight    Piriformis  very tight      Palpation   Palpation comment  very tender with knots in the lumbar area L>R, tender with spasms in the left gluteal area       Ambulation/Gait   Gait Comments  gait is very sloe for the first few steps, when he shifts weight onto the legs he snaps the legs back into full extension, almost an ataxic way where it seems that he has minimal control of the legs, he reports spasms in the legs at about 50 steps and has to sit, reports spasms in the quads, HS and calves                Objective measurements completed on examination: See above findings.      [facility] Adult PT Treatment/Exercise - 01/07/18 0001      Modalities   Modalities  Moist Heat;Electrical Stimulation      Moist Heat Therapy   Number Minutes Moist Heat  15 Minutes    Moist Heat Location  Lumbar Spine      Electrical Stimulation   Electrical Stimulation Location  left lumbar and buttock area    Electrical Stimulation Action  IFC    Electrical Stimulation Parameters  right side lying    Electrical Stimulation Goals  Pain       Trigger Point Dry Needling - 01/07/18 1658    Consent Given?  Yes    Education Handout Provided  Yes    Muscles Treated Upper Body  Quadratus Lumborum;Longissimus    Longissimus Response  Twitch response elicited;Palpable increased muscle length                PT Long Term Goals - 01/07/18 1704      PT LONG TERM GOAL #1   Title  understand proper posture and body mechanics    Time  8    Period  Weeks    Status  New      PT LONG TERM GOAL #2   Title  report pain decreased 25%    Time  8    Period  Weeks    Status  New      PT LONG TERM GOAL #3   Title  walk 300 feet without sitting    Time  8  Period  Weeks    Status  New      PT LONG TERM GOAL #4   Title  get up from sitting without difficulty    Time  8    Period  Weeks    Status  New             Plan - 01/07/18 1659    Clinical Impression Statement  Patient has had 2 cervical fusions, a thoracic fusion in the past 3-4 years.  he has a history of lumbar DDD, he reports that over the past 8 months he has had increased LBP,  increased numbness and spasms in the legs, he reports that over the past 6 months he has had much more difficulty walking and performing his normal tasks.  He has limited lumbar ROM, he is very tight in the LE's.  Walking is limited to about 50 feet at a time due to spasms in the LE's and pain.  When he walks he has what appears to be an ataxic gait with him bringing his knees into full extension and no bend at the knees, some off balance.  MD order reports spondylolisthesis    Clinical Presentation  Evolving    Clinical Decision Making  Low    Rehab Potential  Good    PT Frequency  2x / week    PT Duration  8 weeks    PT Treatment/Interventions  ADLs/Self Care Home Management;Electrical Stimulation;Moist Heat;Traction;Therapeutic activities;Therapeutic exercise;Balance training;Neuromuscular re-education;Manual techniques;Patient/family education;Dry needling    PT Next Visit Plan  see if needling helps, start some flexibility    Consulted and Agree with Plan of Care  Patient       Patient will benefit from skilled therapeutic intervention in order to improve the following deficits and impairments:  Abnormal gait, Decreased coordination, Decreased range of motion, Difficulty walking, Increased fascial restricitons, Increased muscle spasms, Decreased activity tolerance, Pain, Improper body mechanics, Impaired flexibility, Decreased balance, Decreased mobility, Decreased strength  Visit Diagnosis: Acute bilateral low back pain with bilateral sciatica - Plan: PT plan of care cert/re-cert  Muscle spasm of back - Plan: PT plan of care cert/re-cert  Difficulty in walking, not elsewhere classified - Plan: PT plan of care cert/re-cert     Problem List Patient Active Problem List   Diagnosis Date Noted  . Trochanteric bursitis, left hip 01/15/2017    Jearld Lesch., PT 01/07/2018, 5:16 PM  [facility] [address] [place], [place], [address] Phone: [phone]   Fax:  [phone]  Name: Chase Gonzalez MRN: [id number] Date of Birth: 08/08/1960

## 2018-01-07 NOTE — Patient Instructions (Signed)

## 2018-01-18 ENCOUNTER — Encounter: Payer: Self-pay | Admitting: Physical Therapy

## 2018-01-18 ENCOUNTER — Ambulatory Visit: Payer: BLUE CROSS/BLUE SHIELD | Attending: Neurosurgery | Admitting: Physical Therapy

## 2018-01-18 DIAGNOSIS — M5442 Lumbago with sciatica, left side: Secondary | ICD-10-CM | POA: Diagnosis not present

## 2018-01-18 DIAGNOSIS — M5441 Lumbago with sciatica, right side: Secondary | ICD-10-CM

## 2018-01-18 DIAGNOSIS — M62830 Muscle spasm of back: Secondary | ICD-10-CM | POA: Insufficient documentation

## 2018-01-18 DIAGNOSIS — R262 Difficulty in walking, not elsewhere classified: Secondary | ICD-10-CM | POA: Diagnosis not present

## 2018-01-18 NOTE — Therapy (Signed)
[place], [place], [address] Phone: [phone]   Fax:  [phone]  Physical Therapy Treatment  Patient Details  Name: Chase Gonzalez MRN: [id number] Date of Birth: 1960/11/27 Referring Provider: Annette Stable   Encounter Date: 01/18/2018  PT End of Session - 01/18/18 0843    Visit Number  2    Date for PT Re-Evaluation  03/10/18    PT Start Time  0800    PT Stop Time  0856    PT Time Calculation (min)  56 min    Activity Tolerance  Patient tolerated treatment well    Behavior During Therapy  [facility] for tasks assessed/performed       History reviewed. No pertinent past medical history.  Past Surgical History:  Procedure Laterality Date  . ANTERIOR CERVICAL DECOMP/DISCECTOMY FUSION  10/15/2017   Dr. Annette Stable  . left TKR Left 06/2016   Dr. Alvan Dame  . posterior cervical disc fusion  02/26/2017  . THORACIC [place] SURGERY     2017, 2 ruptured discs, had emergency surgery    There were no vitals filed for this visit.  Subjective Assessment - 01/18/18 0803    Subjective  "about the same" Pt reports that's he tends to lose his balance when he moves too fast.     Currently in Pain?  Yes    Pain Score  4     Pain Location  Back    Pain Orientation  Lower                       [facility] Adult PT Treatment/Exercise - 01/18/18 0001      Exercises   Exercises  Lumbar      Lumbar Exercises: Stretches   Passive Hamstring Stretch  Left;Right;4 reps;10 seconds;20 seconds    Single Knee to Chest Stretch  Left;Right;4 reps;10 seconds;20 seconds    Piriformis Stretch  Right;Left;3 reps;20 seconds;10 seconds      Lumbar Exercises: Aerobic   Nustep  L4 x 6 min       Lumbar Exercises: Standing   Row  Theraband;Both;15 reps;Strengthening x2    Shoulder Extension  Theraband;15 reps;Both;Strengthening x2      Lumbar Exercises: Supine   Ab Set  20 reps;3 seconds    Straight Leg Raise  2 seconds;20 reps    Other Supine Lumbar Exercises  Alt K2C x10     Other Supine Lumbar Exercises  hooklying marches 3x5 each side       Modalities   Modalities  Moist Heat;Electrical Stimulation      Moist Heat Therapy   Number Minutes Moist Heat  15 Minutes    Moist Heat Location  Lumbar Spine      Electrical Stimulation   Electrical Stimulation Location  left lumbar and buttock area    Electrical Stimulation Action  IFC    Electrical Stimulation Parameters  sitting    Electrical Stimulation Goals  Pain               PT Short Term Goals - 01/18/18 0847      PT SHORT TERM GOAL #1   Status  Achieved        PT Long Term Goals - 01/18/18 0847      PT LONG TERM GOAL #1   Title  understand proper posture and body mechanics    Status  Partially Met      PT LONG TERM GOAL #2  Title  report pain decreased 25%    Status  On-going      PT LONG TERM GOAL #3   Title  walk 300 feet without sitting    Status  On-going            Plan - 01/18/18 0843    Clinical Impression Statement  Pt tolerated an initial progression to TE well. Core weakness noted when with today's interventions. Pt fatigued quick with supine interventions. Reports that he has not done much activity. Involuntary LE shaking noted when standing and while laying supine. Positive response to supine stretches. Bilat HS tightness noted. Piriformis tightness also noted R>L    PT Next Visit Plan  Core strengthening and flexibility       Patient will benefit from skilled therapeutic intervention in order to improve the following deficits and impairments:  Abnormal gait, Decreased coordination, Decreased range of motion, Difficulty walking, Increased fascial restricitons, Increased muscle spasms, Decreased activity tolerance, Pain, Improper body mechanics, Impaired flexibility, Decreased balance, Decreased mobility, Decreased strength  Visit Diagnosis: Muscle spasm of back  Acute bilateral low back pain with bilateral  sciatica  Difficulty in walking, not elsewhere classified     Problem List Patient Active Problem List   Diagnosis Date Noted  . Trochanteric bursitis, left hip 01/15/2017    Scot Jun, PTA 01/18/2018, 8:48 AM  [place], [place], [address] Phone: [phone]   Fax:  [phone]  Name: Kanin Lia MRN: [id number] Date of Birth: 05/02/1961

## 2018-01-20 DIAGNOSIS — M48062 Spinal stenosis, lumbar region with neurogenic claudication: Secondary | ICD-10-CM | POA: Diagnosis not present

## 2018-01-21 ENCOUNTER — Ambulatory Visit: Payer: BLUE CROSS/BLUE SHIELD | Admitting: Physical Therapy

## 2018-01-21 ENCOUNTER — Encounter: Payer: Self-pay | Admitting: Physical Therapy

## 2018-01-21 DIAGNOSIS — M5442 Lumbago with sciatica, left side: Secondary | ICD-10-CM | POA: Diagnosis not present

## 2018-01-21 DIAGNOSIS — M62830 Muscle spasm of back: Secondary | ICD-10-CM | POA: Diagnosis not present

## 2018-01-21 DIAGNOSIS — R262 Difficulty in walking, not elsewhere classified: Secondary | ICD-10-CM

## 2018-01-21 DIAGNOSIS — M5441 Lumbago with sciatica, right side: Secondary | ICD-10-CM | POA: Diagnosis not present

## 2018-01-21 NOTE — Therapy (Signed)
[place] [address] [place], [place], [address] Phone: [phone]   Fax:  [phone]  Physical Therapy Treatment  Patient Details  Name: Chase Gonzalez MRN: [id number] Date of Birth: 10-18-1960 Referring Provider: Annette Stable   Encounter Date: 01/21/2018  PT End of Session - 01/21/18 1701    Visit Number  3    Date for PT Re-Evaluation  03/10/18    PT Start Time  1610    PT Stop Time  1700    PT Time Calculation (min)  50 min    Activity Tolerance  Patient tolerated treatment well    Behavior During Therapy  [facility] for tasks assessed/performed       History reviewed. No pertinent past medical history.  Past Surgical History:  Procedure Laterality Date  . ANTERIOR CERVICAL DECOMP/DISCECTOMY FUSION  10/15/2017   Dr. Annette Stable  . left TKR Left 06/2016   Dr. Alvan Dame  . posterior cervical disc fusion  02/26/2017  . THORACIC [place] SURGERY     2017, 2 ruptured discs, had emergency surgery    There were no vitals filed for this visit.  Subjective Assessment - 01/21/18 1606    Subjective  Patient reports that he had a pain injection yesterday.  He reports that he had to use a W/C to leave the office, but reports that once he got home he was feeling better.  Reports no pain today, but an ache    Currently in Pain?  Yes    Pain Score  1     Pain Location  Back    Pain Orientation  Lower    Aggravating Factors   end of day really makes it worse    Pain Relieving Factors  the injection helped                       [facility] Adult PT Treatment/Exercise - 01/21/18 0001      Exercises   Exercises  Lumbar      Lumbar Exercises: Stretches   Passive Hamstring Stretch  Right;Left;4 reps;20 seconds    Single Knee to Chest Stretch  Right;Left;4 reps;20 seconds    Piriformis Stretch  Right;Left;4 reps;20 seconds      Lumbar Exercises: Aerobic   Recumbent Bike  level 2 x 6 minutes    Nustep  L4 x 6 min       Lumbar  Exercises: Standing   Row  Theraband;20 reps    Theraband Level (Row)  Level 3 (Green)    Shoulder Extension  20 reps;Theraband    Theraband Level (Shoulder Extension)  Level 3 (Green)      Lumbar Exercises: Supine   Ab Set  20 reps;3 seconds               PT Short Term Goals - 01/18/18 0847      PT SHORT TERM GOAL #1   Status  Achieved        PT Long Term Goals - 01/18/18 0847      PT LONG TERM GOAL #1   Title  understand proper posture and body mechanics    Status  Partially Met      PT LONG TERM GOAL #2   Title  report pain decreased 25%    Status  On-going      PT LONG TERM GOAL #3   Title  walk 300 feet without sitting    Status  On-going  Plan - 01/21/18 1702    Clinical Impression Statement  Patient had injection yesterday, he reports much less pain, we did low level movements, tband was done in sitting, stretches were good, He reported that he had been doing SLR's with both legs at the same time, I told him DO NOT do this, explained the stress on the spine    PT Next Visit Plan  see him one more visit prior to him seeing MD on the 21st    Consulted and Agree with Plan of Care  Patient       Patient will benefit from skilled therapeutic intervention in order to improve the following deficits and impairments:  Abnormal gait, Decreased coordination, Decreased range of motion, Difficulty walking, Increased fascial restricitons, Increased muscle spasms, Decreased activity tolerance, Pain, Improper body mechanics, Impaired flexibility, Decreased balance, Decreased mobility, Decreased strength  Visit Diagnosis: Muscle spasm of back  Acute bilateral low back pain with bilateral sciatica  Difficulty in walking, not elsewhere classified     Problem List Patient Active Problem List   Diagnosis Date Noted  . Trochanteric bursitis, left hip 01/15/2017    Sumner Boast., PT 01/21/2018, 5:04 PM  [place] [address] [place], [place], [address] Phone: [phone]   Fax:  [phone]  Name: Chase Gonzalez MRN: [id number] Date of Birth: 31-Aug-1960

## 2018-02-01 ENCOUNTER — Ambulatory Visit: Payer: BLUE CROSS/BLUE SHIELD | Admitting: Physical Therapy

## 2018-02-01 ENCOUNTER — Encounter: Payer: Self-pay | Admitting: Physical Therapy

## 2018-02-01 DIAGNOSIS — M62830 Muscle spasm of back: Secondary | ICD-10-CM

## 2018-02-01 DIAGNOSIS — R262 Difficulty in walking, not elsewhere classified: Secondary | ICD-10-CM | POA: Diagnosis not present

## 2018-02-01 DIAGNOSIS — M5442 Lumbago with sciatica, left side: Secondary | ICD-10-CM

## 2018-02-01 DIAGNOSIS — M5441 Lumbago with sciatica, right side: Secondary | ICD-10-CM | POA: Diagnosis not present

## 2018-02-01 NOTE — Therapy (Signed)
[address] [place], [place], [address] Phone: [phone]   Fax:  [phone]  Physical Therapy Treatment  Patient Details  Name: Chase Gonzalez MRN: [id number] Date of Birth: 1960/08/08 Referring Provider: Annette Stable   Encounter Date: 02/01/2018  PT End of Session - 02/01/18 1802    Visit Number  4    Date for PT Re-Evaluation  03/10/18    PT Start Time  1657    PT Stop Time  1745    PT Time Calculation (min)  48 min    Activity Tolerance  Patient tolerated treatment well    Behavior During Therapy  [facility] for tasks assessed/performed       History reviewed. No pertinent past medical history.  Past Surgical History:  Procedure Laterality Date  . ANTERIOR CERVICAL DECOMP/DISCECTOMY FUSION  10/15/2017   Dr. Annette Stable  . left TKR Left 06/2016   Dr. Alvan Dame  . posterior cervical disc fusion  02/26/2017  . THORACIC [place] SURGERY     2017, 2 ruptured discs, had emergency surgery    There were no vitals filed for this visit.  Subjective Assessment - 02/01/18 1658    Subjective  Patient reports that he has a hard time getting out of bed due to stiffness and pain, reports that after 40 minutes he is doing better, reports that after lunch he gets really stiff and has a lot more spasms and difficulty walking    Currently in Pain?  Yes    Pain Score  5     Pain Location  Back    Pain Orientation  Lower    Pain Descriptors / Indicators  Aching;Spasm    Aggravating Factors   as the day goes on I get worse                       [facility] Adult PT Treatment/Exercise - 02/01/18 0001      High Level Balance   High Level Balance Comments  on airex ball tossing, then side stepping while ball tossing, close supervision needed      Exercises   Exercises  Lumbar      Lumbar Exercises: Stretches   Passive Hamstring Stretch  Right;Left;4 reps;20 seconds    Single Knee to Chest Stretch  Right;Left;4 reps;20 seconds     Piriformis Stretch  Right;Left;4 reps;20 seconds      Lumbar Exercises: Aerobic   Recumbent Bike  level 2 x 6 minutes    Nustep  L4 x 6 min       Lumbar Exercises: Machines for Strengthening   Cybex Knee Extension  5# 2x10    Cybex Knee Flexion  20# 2x10    Other Lumbar Machine Exercise  rows and lats 25# 2x10      Lumbar Exercises: Seated   Other Seated Lumbar Exercises  red tband ankle DF      Modalities   Modalities  Moist Heat;Electrical Stimulation      Moist Heat Therapy   Number Minutes Moist Heat  15 Minutes    Moist Heat Location  Lumbar Spine      Electrical Stimulation   Electrical Stimulation Location  left lumbar and buttock area    Electrical Stimulation Action  IFC    Electrical Stimulation Parameters  sitting    Electrical Stimulation Goals  Pain               PT Short Term Goals -  01/18/18 0847      PT SHORT TERM GOAL #1   Status  Achieved        PT Long Term Goals - 01/18/18 0847      PT LONG TERM GOAL #1   Title  understand proper posture and body mechanics    Status  Partially Met      PT LONG TERM GOAL #2   Title  report pain decreased 25%    Status  On-going      PT LONG TERM GOAL #3   Title  walk 300 feet without sitting    Status  On-going            Plan - 02/01/18 1802    Clinical Impression Statement  Patient overall had some good relief from the back injection last week but reports that it was short lived, he has pain and tightness as the day goes on and starts having increased ataxic movements of the LE's.  On the airex I noticed that he was always leaning forward using PF for balance, later I  tested him and his DF is actually good so I am unsure why the lean forward.  At times after fatigue and mm use he does have this tetany or clonus type movements of the calves     PT Next Visit Plan  Patient will see the MD this week, see if the MD has any suggestions for [place], if not we will continue to work on core and balance     Consulted and Agree with Plan of Care  Patient       Patient will benefit from skilled therapeutic intervention in order to improve the following deficits and impairments:  Abnormal gait, Decreased coordination, Decreased range of motion, Difficulty walking, Increased fascial restricitons, Increased muscle spasms, Decreased activity tolerance, Pain, Improper body mechanics, Impaired flexibility, Decreased balance, Decreased mobility, Decreased strength  Visit Diagnosis: Muscle spasm of back  Acute bilateral low back pain with bilateral sciatica  Difficulty in walking, not elsewhere classified     Problem List Patient Active Problem List   Diagnosis Date Noted  . Trochanteric bursitis, left hip 01/15/2017    Sumner Boast., PT 02/01/2018, 6:06 PM  [place] [address] [place], [place], [address] Phone: [phone]   Fax:  [phone]  Name: Chase Gonzalez MRN: [id number] Date of Birth: August 26, 1960

## 2018-02-03 DIAGNOSIS — M4317 Spondylolisthesis, lumbosacral region: Secondary | ICD-10-CM | POA: Diagnosis not present

## 2018-02-11 ENCOUNTER — Ambulatory Visit: Payer: BLUE CROSS/BLUE SHIELD | Admitting: Physical Therapy

## 2018-02-11 ENCOUNTER — Encounter: Payer: Self-pay | Admitting: Physical Therapy

## 2018-02-11 DIAGNOSIS — R262 Difficulty in walking, not elsewhere classified: Secondary | ICD-10-CM | POA: Diagnosis not present

## 2018-02-11 DIAGNOSIS — M5442 Lumbago with sciatica, left side: Secondary | ICD-10-CM | POA: Diagnosis not present

## 2018-02-11 DIAGNOSIS — M62830 Muscle spasm of back: Secondary | ICD-10-CM | POA: Diagnosis not present

## 2018-02-11 DIAGNOSIS — M5441 Lumbago with sciatica, right side: Secondary | ICD-10-CM

## 2018-02-11 NOTE — Therapy (Signed)
[place], [place], [address] Phone: [phone]   Fax:  [phone]  Physical Therapy Treatment  Patient Details  Name: Chase Gonzalez MRN: [id number] Date of Birth: 13-Sep-1960 Referring Provider: Annette Stable   Encounter Date: 02/11/2018  PT End of Session - 02/11/18 1723    Visit Number  5    Date for PT Re-Evaluation  03/10/18    PT Start Time  4196    PT Stop Time  1747    PT Time Calculation (min)  54 min    Activity Tolerance  Patient tolerated treatment well    Behavior During Therapy  [facility] for tasks assessed/performed       History reviewed. No pertinent past medical history.  Past Surgical History:  Procedure Laterality Date  . ANTERIOR CERVICAL DECOMP/DISCECTOMY FUSION  10/15/2017   Dr. Annette Stable  . left TKR Left 06/2016   Dr. Alvan Dame  . posterior cervical disc fusion  02/26/2017  . THORACIC [place] SURGERY     2017, 2 ruptured discs, had emergency surgery    There were no vitals filed for this visit.  Subjective Assessment - 02/11/18 1701    Subjective  Patient reports saw MD, will return to MD in October.  Unsure of what is going on but patient is worried about the legs not getting better    Currently in Pain?  Yes    Pain Score  3     Pain Location  Back    Pain Orientation  Lower    Pain Relieving Factors  hydrocodone                       [facility] Adult PT Treatment/Exercise - 02/11/18 0001      Lumbar Exercises: Stretches   Passive Hamstring Stretch  Right;Left;4 reps;20 seconds    Single Knee to Chest Stretch  Right;Left;4 reps;20 seconds    Piriformis Stretch  Right;Left;4 reps;20 seconds      Lumbar Exercises: Aerobic   Recumbent Bike  level 2 x 6 minutes    Nustep  L4 x 6 min       Lumbar Exercises: Machines for Strengthening   Other Lumbar Machine Exercise  rows and lats 25# 2x10      Lumbar Exercises: Supine   Ab Set  20 reps;3 seconds      Modalities   Modalities  Traction;Moist Heat;Electrical Stimulation      Moist Heat Therapy   Number Minutes Moist Heat  15 Minutes    Moist Heat Location  Lumbar Spine      Electrical Stimulation   Electrical Stimulation Location  left lumbar and buttock area    Electrical Stimulation Action  IFC    Electrical Stimulation Parameters  sitting    Electrical Stimulation Goals  Pain      Traction   Type of Traction  Lumbar    Min (lbs)  75    Max (lbs)  100    Hold Time  60    Rest Time  10    Time  15             PT Education - 02/11/18 1722    Education Details  went over traction and how it could help the 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  He reports less numbness in toes and feet while on traction    PT Next Visit Plan  see how he tolerated after traction    Consulted and Agree with Plan of Care  Patient       Patient will benefit from skilled therapeutic intervention in order to improve the following deficits and impairments:  Abnormal gait, Decreased coordination, Decreased range of motion, Difficulty walking, Increased fascial restricitons, Increased muscle spasms, Decreased activity tolerance, Pain, Improper body mechanics, Impaired flexibility, Decreased balance, Decreased mobility, Decreased strength  Visit Diagnosis: Muscle spasm of back  Acute bilateral low back pain with bilateral  sciatica  Difficulty in walking, not elsewhere classified     Problem List Patient Active Problem List   Diagnosis Date Noted  . Trochanteric bursitis, left hip 01/15/2017    Sumner Boast., PT 02/11/2018, 5:41 PM  [place] [address] [place], [place], [address] Phone: [phone]   Fax:  [phone]  Name: Chase Gonzalez MRN: [id number] Date of Birth: 08-06-1960

## 2018-02-23 ENCOUNTER — Encounter: Payer: Self-pay | Admitting: Physical Therapy

## 2018-02-23 ENCOUNTER — Ambulatory Visit: Payer: BLUE CROSS/BLUE SHIELD | Attending: Neurosurgery | Admitting: Physical Therapy

## 2018-02-23 DIAGNOSIS — M62830 Muscle spasm of back: Secondary | ICD-10-CM | POA: Diagnosis not present

## 2018-02-23 DIAGNOSIS — M5442 Lumbago with sciatica, left side: Secondary | ICD-10-CM | POA: Insufficient documentation

## 2018-02-23 DIAGNOSIS — M5441 Lumbago with sciatica, right side: Secondary | ICD-10-CM

## 2018-02-23 DIAGNOSIS — R262 Difficulty in walking, not elsewhere classified: Secondary | ICD-10-CM | POA: Diagnosis not present

## 2018-02-23 NOTE — Therapy (Signed)
[facility] [place] [address] [place], [place], [address] Phone: [phone]   Fax:  [phone]  Physical Therapy Treatment  Patient Details  Name: Chase Gonzalez MRN: [id number] Date of Birth: [birth year] Referring Provider: Jordan Likes   Encounter Date: 02/23/2018  PT End of Session - 02/23/18 1753    Visit Number  6    Date for PT Re-Evaluation  03/10/18    PT Start Time  1650    PT Stop Time  1735    PT Time Calculation (min)  45 min    Activity Tolerance  Patient tolerated treatment well    Behavior During Therapy  [facility] for tasks assessed/performed       History reviewed. No pertinent past medical history.  Past Surgical History:  Procedure Laterality Date  . ANTERIOR CERVICAL DECOMP/DISCECTOMY FUSION  10/15/2017   Dr. Jordan Likes  . left TKR Left 06/2016   Dr. Charlann Boxer  . posterior cervical disc fusion  02/26/2017  . THORACIC DISC SURGERY     2017, 2 ruptured discs, had emergency surgery    There were no vitals filed for this visit.  Subjective Assessment - 02/23/18 1751    Subjective  Patient reports that he felt good after the traction that night, reports that the next day he was very sore, but then reports that he was able to tolerate more activity over the weekend without an increase of normal pain    Currently in Pain?  Yes    Pain Score  3     Pain Location  Back    Aggravating Factors   end of day and after sitting longer                       [facility] Adult PT Treatment/Exercise - 02/23/18 0001      Exercises   Exercises  Lumbar      Lumbar Exercises: Stretches   Passive Hamstring Stretch  Right;Left;4 reps;20 seconds    Single Knee to Chest Stretch  Right;Left;4 reps;20 seconds      Lumbar Exercises: Aerobic   Recumbent Bike  level 4 x 6 minutes    Nustep  L4 x 6 min       Lumbar Exercises: Machines for Strengthening   Other Lumbar Machine Exercise  leg press 20# bilateral and then left leg  only      Lumbar Exercises: Standing   Other Standing Lumbar Exercises  hip extension 3# bilaterally 2x10      Lumbar Exercises: Supine   Ab Set  20 reps;3 seconds    Other Supine Lumbar Exercises  bridges 2x20, this caused the spasms in the legs      Traction   Type of Traction  Lumbar    Min (lbs)  65    Max (lbs)  90    Hold Time  60    Rest Time  20    Time  15               PT Short Term Goals - 01/18/18 0847      PT SHORT TERM GOAL #1   Status  Achieved        PT Long Term Goals - 02/23/18 1759      PT LONG TERM GOAL #2   Title  report pain decreased 25%    Status  On-going      PT LONG TERM GOAL #3   Title  walk 300  feet without sitting    Status  Achieved      PT LONG TERM GOAL #4   Title  get up from sitting without difficulty    Status  Achieved            Plan - 02/23/18 1755    Clinical Impression Statement  Patient reported being very sore the next day after traction, he then the next few days after that was able to tolerate increased activity wihtout increase of pain.  He is continuing to snap the left leg back into flexion with mid stance almost as he does not trust the leg.  He was able to control the leg on the leg press but had difficulty with standing hip extension and with bridges    PT Next Visit Plan  continue to adjust traction to see if it helps more    Consulted and Agree with Plan of Care  Patient       Patient will benefit from skilled therapeutic intervention in order to improve the following deficits and impairments:  Abnormal gait, Decreased coordination, Decreased range of motion, Difficulty walking, Increased fascial restricitons, Increased muscle spasms, Decreased activity tolerance, Pain, Improper body mechanics, Impaired flexibility, Decreased balance, Decreased mobility, Decreased strength  Visit Diagnosis: Muscle spasm of back  Acute bilateral low back pain with bilateral sciatica  Difficulty in walking, not  elsewhere classified     Problem List Patient Active Problem List   Diagnosis Date Noted  . Trochanteric bursitis, left hip 01/15/2017    Jearld Lesch., PT 02/23/2018, 5:59 PM  [facility] [place] [place] [address] [place], [place], [address] Phone: [phone]   Fax:  [phone]  Name: Rico Massar MRN: [id number] Date of Birth: 03-Jul-1960

## 2018-03-09 ENCOUNTER — Encounter: Payer: Self-pay | Admitting: Physical Therapy

## 2018-03-09 ENCOUNTER — Ambulatory Visit: Payer: BLUE CROSS/BLUE SHIELD | Admitting: Physical Therapy

## 2018-03-09 DIAGNOSIS — M5441 Lumbago with sciatica, right side: Secondary | ICD-10-CM

## 2018-03-09 DIAGNOSIS — M62830 Muscle spasm of back: Secondary | ICD-10-CM

## 2018-03-09 DIAGNOSIS — M5442 Lumbago with sciatica, left side: Secondary | ICD-10-CM

## 2018-03-09 DIAGNOSIS — R262 Difficulty in walking, not elsewhere classified: Secondary | ICD-10-CM | POA: Diagnosis not present

## 2018-03-09 NOTE — Therapy (Signed)
[place] [address] [place], [place], [address] Phone: [phone]   Fax:  [phone]  Physical Therapy Treatment  Patient Details  Name: Epimenio Schetter MRN: [id number] Date of Birth: 1960/12/12 Referring Provider: Annette Stable   Encounter Date: 03/09/2018  PT End of Session - 03/09/18 1738    Visit Number  7    Date for PT Re-Evaluation  04/09/18    PT Start Time  4035    PT Stop Time  1750    PT Time Calculation (min)  60 min    Activity Tolerance  Patient tolerated treatment well    Behavior During Therapy  [facility] for tasks assessed/performed       History reviewed. No pertinent past medical history.  Past Surgical History:  Procedure Laterality Date  . ANTERIOR CERVICAL DECOMP/DISCECTOMY FUSION  10/15/2017   Dr. Annette Stable  . left TKR Left 06/2016   Dr. Alvan Dame  . posterior cervical disc fusion  02/26/2017  . THORACIC [place] SURGERY     2017, 2 ruptured discs, had emergency surgery    There were no vitals filed for this visit.  Subjective Assessment - 03/09/18 1702    Subjective  Patient reports that again he was feeling a little better but had a cookout at his house and did about 12-13000 steps and the next day was "just done, wore out"  reports first thing in the AM very difficult to get moving , then he does welll up until about 3-4 PM when s=he starts having trouble walking    Currently in Pain?  Yes    Pain Score  3     Pain Location  Back    Pain Orientation  Lower    Aggravating Factors   end of day, first thing in the AM, after any sitting         [facility] PT Assessment - 03/09/18 0001      Assessment   Medical Diagnosis  LBP with spondylolisthesis    Referring Provider  [facility] Adult PT Treatment/Exercise - 03/09/18 0001      Lumbar Exercises: Aerobic   Nustep  L4 x 6 min       Lumbar Exercises: Machines for Strengthening   Cybex Knee Extension  5# 2x10 left leg only     Cybex Knee Flexion  left leg 15#    Other Lumbar Machine Exercise  leg press 20# bilateral and then left leg only      Electrical Stimulation   Electrical Stimulation Location  US/estim combo to the left SI area in right sidelying    Electrical Stimulation Parameters  right sidelying    Electrical Stimulation Goals  Pain      Traction   Min (lbs)  75    Max (lbs)  95    Hold Time  60    Rest Time  20    Time  12               PT Short Term Goals - 01/18/18 0847      PT SHORT TERM GOAL #1   Status  Achieved        PT Long Term Goals - 03/09/18 1743      PT LONG TERM GOAL #1   Title  understand proper posture and body mechanics    Status  Achieved  PT LONG TERM GOAL #2   Title  report pain decreased 25%    Status  Partially Met      PT LONG TERM GOAL #3   Title  walk 300 feet without sitting    Status  Achieved      PT LONG TERM GOAL #4   Title  get up from sitting without difficulty    Status  Achieved            Plan - 03/09/18 1739    Clinical Impression Statement  Patient continues to walk with hyperextendning the left knee, this seems to be due to fatigue/weakness/lack of control of the quads, has an ataxic type gait with the left.  He has clonus that happens tpward the end of the day in the legs, he has hyperactive DTR's of both legs.  We are trying to focus on control of the left leg and sending all the signals we can through the pathways.  He reports very stiff and sore in the AM, then he gets better and does not have as much difficulty walking, reports by 3-4PM he starts haivng trouble and doing the hyperextension of the left leg again.  His wife reports that she feels he is walking better, he did not have as much clonus with the quad exercises today    PT Next Visit Plan  may hold until after he sees the MD    Consulted and Agree with Plan of Care  Patient       Patient will benefit from skilled therapeutic intervention in order to  improve the following deficits and impairments:  Abnormal gait, Decreased coordination, Decreased range of motion, Difficulty walking, Increased fascial restricitons, Increased muscle spasms, Decreased activity tolerance, Pain, Improper body mechanics, Impaired flexibility, Decreased balance, Decreased mobility, Decreased strength  Visit Diagnosis: Muscle spasm of back  Acute bilateral low back pain with bilateral sciatica  Difficulty in walking, not elsewhere classified     Problem List Patient Active Problem List   Diagnosis Date Noted  . Trochanteric bursitis, left hip 01/15/2017    , W ., PT 03/09/2018, 5:44 PM  [facility] [place] [address] [place], [place], [address] Phone: [phone]   Fax:  [phone]  Name: Kendarius Heatherington MRN: [id number] Date of Birth: 09/15/1960   

## 2018-03-30 DIAGNOSIS — M48062 Spinal stenosis, lumbar region with neurogenic claudication: Secondary | ICD-10-CM | POA: Diagnosis not present

## 2018-04-08 DIAGNOSIS — M48062 Spinal stenosis, lumbar region with neurogenic claudication: Secondary | ICD-10-CM | POA: Diagnosis not present

## 2018-05-11 DIAGNOSIS — M47816 Spondylosis without myelopathy or radiculopathy, lumbar region: Secondary | ICD-10-CM | POA: Diagnosis not present

## 2018-05-11 DIAGNOSIS — M48062 Spinal stenosis, lumbar region with neurogenic claudication: Secondary | ICD-10-CM | POA: Diagnosis not present

## 2018-05-11 DIAGNOSIS — M4316 Spondylolisthesis, lumbar region: Secondary | ICD-10-CM | POA: Diagnosis not present

## 2018-06-07 DIAGNOSIS — L578 Other skin changes due to chronic exposure to nonionizing radiation: Secondary | ICD-10-CM | POA: Diagnosis not present

## 2018-06-07 DIAGNOSIS — L570 Actinic keratosis: Secondary | ICD-10-CM | POA: Diagnosis not present

## 2018-08-02 ENCOUNTER — Encounter: Payer: Self-pay | Admitting: Physical Therapy

## 2018-08-02 ENCOUNTER — Other Ambulatory Visit: Payer: Self-pay

## 2018-08-02 ENCOUNTER — Ambulatory Visit: Payer: BLUE CROSS/BLUE SHIELD | Attending: Neurosurgery | Admitting: Physical Therapy

## 2018-08-02 DIAGNOSIS — M5441 Lumbago with sciatica, right side: Secondary | ICD-10-CM | POA: Insufficient documentation

## 2018-08-02 DIAGNOSIS — R262 Difficulty in walking, not elsewhere classified: Secondary | ICD-10-CM | POA: Diagnosis not present

## 2018-08-02 DIAGNOSIS — M5442 Lumbago with sciatica, left side: Secondary | ICD-10-CM | POA: Diagnosis not present

## 2018-08-02 DIAGNOSIS — M62830 Muscle spasm of back: Secondary | ICD-10-CM

## 2018-08-02 DIAGNOSIS — R260 Ataxic gait: Secondary | ICD-10-CM

## 2018-08-03 NOTE — Therapy (Signed)
[facility] [place] [address] [place], [place], [address] Phone: [phone]   Fax:  [phone]  Physical Therapy Evaluation  Patient Details  Name: Chase Blockerric Arthurs MRN: [id number] Date of Birth: 16-Dec-1960 Referring Provider (PT): Pool   Encounter Date: 08/02/2018  PT End of Session - 08/03/18 0745    Visit Number  1    Date for PT Re-Evaluation  10/01/18    PT Start Time  1650    PT Stop Time  1745    PT Time Calculation (min)  55 min    Activity Tolerance  Patient tolerated treatment well    Behavior During Therapy  [facility] for tasks assessed/performed       History reviewed. No pertinent past medical history.  Past Surgical History:  Procedure Laterality Date  . ANTERIOR CERVICAL DECOMP/DISCECTOMY FUSION  10/15/2017   Dr. Jordan LikesPool  . left TKR Left 06/2016   Dr. Charlann Boxerlin  . posterior cervical disc fusion  02/26/2017  . THORACIC DISC SURGERY     2017, 2 ruptured discs, had emergency surgery    There were no vitals filed for this visit.   Subjective Assessment - 08/02/18 1658    Subjective  Patient has had numerous surgeries on his spine 4x in 2 years, thoracic surgery in 2018, then 2 cervical surgeries 2019, then the most recent surgery was in the lumbar area at Thanksgiving, they did a 2 level laminectomy and removed bone spurs.  He reports that he was doing okay but still walking very poorly, He reports that 3-4 weeks ago he was moving some wood, reports it was light but he did a lot of twisting and had severe pain back, he was put on prednisone and the pain is better.  He reports that since the surgery he has less pain. His biggest issue is how he walks.    Limitations  Lifting;Walking;House hold activities    Patient Stated Goals  walk better, have no pain    Currently in Pain?  Yes    Pain Score  2     Pain Location  Back    Pain Orientation  Lower    Pain Descriptors / Indicators  Aching;Sore;Spasm    Pain Type  Acute  pain;Surgical pain    Pain Radiating Towards  still some numbness in teh toes, some pain in the buttocks    Pain Onset  More than a month ago    Pain Frequency  Constant    Aggravating Factors   walking 100 feet pain will be up to 8-9/10    Pain Relieving Factors  hydrocodone, sitting  pain can be 0/10    Effect of Pain on Daily Activities  limits ADL's, getting out of bed, walking is "awful"         [facility] Assessment - 08/02/18 0001      Assessment   Medical Diagnosis  LBP, weakness, difficulty walking    Referring Provider (PT)  Pool    Onset Date/Surgical Date  05/11/18      Precautions   Precaution Comments  no lifting, bending, twisting      Balance Screen   Has the patient fallen in the past 6 months  Yes    How many times?  1    Has the patient had a decrease in activity level because of a fear of falling?   Yes    Is the patient reluctant to leave their home because of a fear  of falling?   No      Home Environment   Additional Comments  a few back steps has a handrail, unable to do yardwork      Prior Function   Level of Independence  Independent    Vocation  Full time employment    Vocation Requirements  mostly computer, sit and stand, 4,000 steps a day    Leisure  no exercise      ROM / Strength   AROM / PROM / Strength  AROM;Strength      AROM   AROM Assessment Site  Hip    Right/Left Hip  Right;Left      Strength   Strength Assessment Site  Hip;Knee;Ankle    Right/Left Hip  Right;Left    Right Hip Flexion  4-/5    Right Hip Extension  4-/5    Right Hip Internal Rotation  4-/5    Right Hip ABduction  3+/5    Left Hip Flexion  4-/5    Left Hip Extension  4-/5    Left Hip Internal Rotation  2+/5    Left Hip ABduction  3+/5    Right/Left Knee  Right;Left    Right Knee Flexion  4/5    Right Knee Extension  4-/5    Left Knee Flexion  4/5    Left Knee Extension  3+/5    Right/Left Ankle  Right;Left    Right Ankle Dorsiflexion  4-/5    Left Ankle  Dorsiflexion  3+/5      Flexibility   Soft Tissue Assessment /Muscle Length  yes    Hamstrings  tight HS, SLR on both 45 degrees with pain in the buttocksw    ITB  very tight    Piriformis  very tight      Palpation   Palpation comment  some scarring over the new scar      Ambulation/Gait   Gait Comments  gait is without assistive device, he has a trendelnberg gait on the left, tends to snap back the left knee, ataxic control of the LE's worse on the left, he at times demonstrates poor control of the ankles, he appears at times to scissor the legs but I feel it is due to the weakness of the hip and the knee                Objective measurements completed on examination: See above findings.              PT Education - 08/02/18 1802    Education Details  HS stretch, piriformis stretch, ride bike 5-10 minutes per day    Person(s) Educated  Patient    Methods  Explanation;Demonstration;Verbal cues    Comprehension  Verbalized understanding       PT Short Term Goals - 08/03/18 0751      PT SHORT TERM GOAL #1   Title  independent with initial HEP    Time  1    Period  Weeks    Status  New        PT Long Term Goals - 08/03/18 0751      PT LONG TERM GOAL #1   Title  understand proper posture and body mechanics    Time  8    Period  Weeks    Status  New      PT LONG TERM GOAL #2   Title  report pain decreased 25%    Time  8    Period  Weeks    Status  New      PT LONG TERM GOAL #3   Title  walk 300 feet without sitting    Time  8    Period  Weeks    Status  New      PT LONG TERM GOAL #4   Title  get up from sitting without difficulty    Time  8    Period  Weeks    Status  New             Plan - 08/03/18 0745    Clinical Impression Statement  Patient has had 4 spine surgeries in the past 2 years, his most recent was in November 2019 a laminectomy of the lumbar area.  He seems to have some issues with ataxic type motions of the LE's, he  has a trendelenberg on the left and snaps the left knee back into extension with no control, however with MMT his strength is better than this, the biggest loss of strength is IR of the left hip, I tend to think that the trendelendber and almost a scissoring type pattern might be habit and balance issues.  His tolerance to walking is 150 feet and pain will be an 8/10.    History and Personal Factors relevant to plan of care:  multiple surgeries recently    Clinical Presentation  Evolving    Clinical Decision Making  Moderate    Rehab Potential  Good    PT Frequency  2x / week    PT Duration  8 weeks    PT Treatment/Interventions  ADLs/Self Care Home Management;Cryotherapy;Electrical Stimulation;Moist Heat;Traction;Ultrasound;Gait training;Stair training;Functional mobility training;Therapeutic activities;Therapeutic exercise;Patient/family education;Neuromuscular re-education;Balance training;Manual techniques    PT Next Visit Plan  Work on strength and coordination    Consulted and Agree with Plan of Care  Patient       Patient will benefit from skilled therapeutic intervention in order to improve the following deficits and impairments:  Abnormal gait, Pain, Increased muscle spasms, Decreased scar mobility, Decreased mobility, Cardiopulmonary status limiting activity, Decreased activity tolerance, Decreased endurance, Decreased range of motion, Decreased strength, Decreased balance, Difficulty walking, Impaired flexibility  Visit Diagnosis: Muscle spasm of back - Plan: PT plan of care cert/re-cert  Acute bilateral low back pain with bilateral sciatica - Plan: PT plan of care cert/re-cert  Difficulty in walking, not elsewhere classified - Plan: PT plan of care cert/re-cert  Ataxic gait - Plan: PT plan of care cert/re-cert     Problem List Patient Active Problem List   Diagnosis Date Noted  . Trochanteric bursitis, left hip 01/15/2017    Jearld Lesch., PT 08/03/2018, 7:54  AM  [facility] [place] [address] [place], [place], [address] Phone: [phone]   Fax:  [phone]  Name: Chase Gonzalez MRN: [id number] Date of Birth: [birth year]

## 2018-08-16 ENCOUNTER — Encounter: Payer: Self-pay | Admitting: Physical Therapy

## 2018-08-16 ENCOUNTER — Ambulatory Visit: Payer: BLUE CROSS/BLUE SHIELD | Attending: Neurosurgery | Admitting: Physical Therapy

## 2018-08-16 ENCOUNTER — Ambulatory Visit: Payer: BLUE CROSS/BLUE SHIELD | Admitting: Physical Therapy

## 2018-08-16 DIAGNOSIS — R260 Ataxic gait: Secondary | ICD-10-CM | POA: Insufficient documentation

## 2018-08-16 DIAGNOSIS — M5442 Lumbago with sciatica, left side: Secondary | ICD-10-CM | POA: Diagnosis not present

## 2018-08-16 DIAGNOSIS — M5441 Lumbago with sciatica, right side: Secondary | ICD-10-CM

## 2018-08-16 DIAGNOSIS — R262 Difficulty in walking, not elsewhere classified: Secondary | ICD-10-CM | POA: Insufficient documentation

## 2018-08-16 DIAGNOSIS — M62830 Muscle spasm of back: Secondary | ICD-10-CM | POA: Diagnosis not present

## 2018-08-16 NOTE — Therapy (Signed)
[facility] [place] [address] [place], [place], [address] Phone: [phone]   Fax:  [phone]  Physical Therapy Treatment  Patient Details  Name: Chase Gonzalez MRN: [id number] Date of Birth: 1961/01/02 Referring Provider (PT): Pool   Encounter Date: 08/16/2018  PT End of Session - 08/16/18 1736    Visit Number  2    Date for PT Re-Evaluation  10/01/18    PT Start Time  1653    PT Stop Time  1736    PT Time Calculation (min)  43 min    Activity Tolerance  Patient tolerated treatment well    Behavior During Therapy  [facility] for tasks assessed/performed       History reviewed. No pertinent past medical history.  Past Surgical History:  Procedure Laterality Date  . ANTERIOR CERVICAL DECOMP/DISCECTOMY FUSION  10/15/2017   Dr. Jordan Likes  . left TKR Left 06/2016   Dr. Charlann Boxer  . posterior cervical disc fusion  02/26/2017  . THORACIC DISC SURGERY     2017, 2 ruptured discs, had emergency surgery    There were no vitals filed for this visit.  Subjective Assessment - 08/16/18 1656    Subjective  Patient reports that he started walking with the two canes and is doing well, has been riding an exercise bike but reports when he stops he has a burning in the low back    Currently in Pain?  Yes    Pain Score  2     Pain Location  Back    Pain Orientation  Lower    Aggravating Factors   walking, rding bike    Pain Relieving Factors  sitting pain can be 0/10                       [facility] Adult PT Treatment/Exercise - 08/16/18 0001      Exercises   Exercises  Lumbar      Lumbar Exercises: Stretches   Passive Hamstring Stretch  3 reps;20 seconds    Piriformis Stretch  4 reps;20 seconds      Lumbar Exercises: Aerobic   Nustep  level 5 x 6 minutes      Lumbar Exercises: Machines for Strengthening   Cybex Knee Extension  single legs 5# 2x10    Cybex Knee Flexion  single legs 20# 2x10    Leg Press  single legs 20# 2x10     Other Lumbar Machine Exercise  tried 5# hip abduciton and extension this caused some pain, so went to 2.5# and did holding onto a walker      Lumbar Exercises: Supine   Other Supine Lumbar Exercises  feet on ball K2C, trunk rotation, small bridges with lower legs over the ball so the motions was much smaller             PT Education - 08/16/18 1757    Education Details  gave HEP for feet on ball K2C, small trunk rotation, small bridges and isometric abs       PT Short Term Goals - 08/16/18 1757      PT SHORT TERM GOAL #1   Title  independent with initial HEP    Status  Achieved        PT Long Term Goals - 08/03/18 0751      PT LONG TERM GOAL #1   Title  understand proper posture and body mechanics    Time  8  Period  Weeks    Status  New      PT LONG TERM GOAL #2   Title  report pain decreased 25%    Time  8    Period  Weeks    Status  New      PT LONG TERM GOAL #3   Title  walk 300 feet without sitting    Time  8    Period  Weeks    Status  New      PT LONG TERM GOAL #4   Title  get up from sitting without difficulty    Time  8    Period  Weeks    Status  New            Plan - 08/16/18 1756    Clinical Impression Statement  Patient is very weak in the hips he could not tolerate the pulleys with 5# on hip extnesion and abduction, had to go to 2.5 # ankle weights, much less shaking compared to last summer with activities.  Gait is still very ataxic but walking with the canes he is much safer and less wobble    PT Next Visit Plan  Work on strength and coordination    Consulted and Agree with Plan of Care  Patient       Patient will benefit from skilled therapeutic intervention in order to improve the following deficits and impairments:  Abnormal gait, Pain, Increased muscle spasms, Decreased scar mobility, Decreased mobility, Cardiopulmonary status limiting activity, Decreased activity tolerance, Decreased endurance, Decreased range of motion,  Decreased strength, Decreased balance, Difficulty walking, Impaired flexibility  Visit Diagnosis: Muscle spasm of back  Acute bilateral low back pain with bilateral sciatica  Difficulty in walking, not elsewhere classified  Ataxic gait     Problem List Patient Active Problem List   Diagnosis Date Noted  . Trochanteric bursitis, left hip 01/15/2017    Jearld Lesch., PT 08/16/2018, 5:58 PM  [facility] [place] [address] [place], [place], [address] Phone: [phone]   Fax:  [phone]  Name: Chase Gonzalez MRN: [id number] Date of Birth: May 16, 1961

## 2018-08-19 DIAGNOSIS — G8929 Other chronic pain: Secondary | ICD-10-CM | POA: Diagnosis not present

## 2018-08-19 DIAGNOSIS — M542 Cervicalgia: Secondary | ICD-10-CM | POA: Diagnosis not present

## 2018-08-19 DIAGNOSIS — M4317 Spondylolisthesis, lumbosacral region: Secondary | ICD-10-CM | POA: Diagnosis not present

## 2018-08-19 DIAGNOSIS — M545 Low back pain: Secondary | ICD-10-CM | POA: Diagnosis not present

## 2018-09-01 ENCOUNTER — Other Ambulatory Visit: Payer: Self-pay

## 2018-09-01 ENCOUNTER — Encounter: Payer: Self-pay | Admitting: Physical Therapy

## 2018-09-01 ENCOUNTER — Ambulatory Visit: Payer: BLUE CROSS/BLUE SHIELD | Admitting: Physical Therapy

## 2018-09-01 DIAGNOSIS — M5442 Lumbago with sciatica, left side: Secondary | ICD-10-CM | POA: Diagnosis not present

## 2018-09-01 DIAGNOSIS — M62830 Muscle spasm of back: Secondary | ICD-10-CM | POA: Diagnosis not present

## 2018-09-01 DIAGNOSIS — R262 Difficulty in walking, not elsewhere classified: Secondary | ICD-10-CM

## 2018-09-01 DIAGNOSIS — M5441 Lumbago with sciatica, right side: Secondary | ICD-10-CM

## 2018-09-01 DIAGNOSIS — R260 Ataxic gait: Secondary | ICD-10-CM | POA: Diagnosis not present
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
## 2018-09-08 DIAGNOSIS — M4317 Spondylolisthesis, lumbosacral region: Secondary | ICD-10-CM | POA: Diagnosis not present

## 2018-10-14 DIAGNOSIS — M4317 Spondylolisthesis, lumbosacral region: Secondary | ICD-10-CM | POA: Diagnosis not present

## 2018-11-18 DIAGNOSIS — Z6829 Body mass index (BMI) 29.0-29.9, adult: Secondary | ICD-10-CM | POA: Diagnosis not present

## 2018-11-18 DIAGNOSIS — R030 Elevated blood-pressure reading, without diagnosis of hypertension: Secondary | ICD-10-CM | POA: Diagnosis not present

## 2018-11-18 DIAGNOSIS — M4317 Spondylolisthesis, lumbosacral region: Secondary | ICD-10-CM | POA: Diagnosis not present

## 2018-11-30 DIAGNOSIS — M5126 Other intervertebral disc displacement, lumbar region: Secondary | ICD-10-CM | POA: Diagnosis not present

## 2018-11-30 DIAGNOSIS — M2548 Effusion, other site: Secondary | ICD-10-CM | POA: Diagnosis not present

## 2018-11-30 DIAGNOSIS — M47816 Spondylosis without myelopathy or radiculopathy, lumbar region: Secondary | ICD-10-CM | POA: Diagnosis not present

## 2018-11-30 DIAGNOSIS — M7138 Other bursal cyst, other site: Secondary | ICD-10-CM | POA: Diagnosis not present

## 2018-12-02 DIAGNOSIS — Z6829 Body mass index (BMI) 29.0-29.9, adult: Secondary | ICD-10-CM | POA: Diagnosis not present

## 2018-12-02 DIAGNOSIS — M4317 Spondylolisthesis, lumbosacral region: Secondary | ICD-10-CM | POA: Diagnosis not present

## 2018-12-02 DIAGNOSIS — R030 Elevated blood-pressure reading, without diagnosis of hypertension: Secondary | ICD-10-CM | POA: Diagnosis not present

## 2018-12-06 ENCOUNTER — Other Ambulatory Visit: Payer: Self-pay | Admitting: Neurosurgery

## 2018-12-23 NOTE — Pre-Procedure Instructions (Signed)
Hadrian Yarbrough  12/23/2018      [place] - [address] [place] [address] Phone: [phone] Fax: [phone]    Your procedure is scheduled on December 28, 2018.  Report to [facility] En[facility] "[facility]" at 600 AM.  Call this number if you have problems the morning of surgery:  [phone]   Remember:  Do not eat or drink after midnight.    Take these medicines the morning of surgery with A SIP OF WATER  Hydrocodone-acetaminophen (norco)-if needed for pain Afrin Nasal Spray-if needed  Beginning now, STOP taking any Meloxicam (Mobic), Aspirin (unless otherwise instructed by your surgeon), Aleve, Naproxen, Ibuprofen, Motrin, Advil, Goody's, BC's, all herbal medications, fish oil, and all vitamins    Do not wear jewelry  Do not wear lotions, powders, or colognes, or deodorant.  Men may shave face and neck.  Do not bring valuables to the hospital.  [facility] is not responsible for any belongings or valuables.  Contacts, dentures or bridgework may not be worn into surgery.  Leave your suitcase in the car.  After surgery it may be brought to your room.  For patients admitted to the hospital, discharge time will be determined by your treatment team.  Patients discharged the day of surgery will not be allowed to drive home.    [place]- Preparing For Surgery  Before surgery, you can play an important role. Because skin is not sterile, your skin needs to be as free of germs as possible. You can reduce the number of germs on your skin by washing with CHG (chlorahexidine gluconate) Soap before surgery.  CHG is an antiseptic cleaner which kills germs and bonds with the skin to continue killing germs even after washing.    Oral Hygiene is also important to reduce your risk of infection.  Remember - BRUSH YOUR TEETH THE MORNING OF SURGERY WITH YOUR REGULAR TOOTHPASTE  Please do not use if you have an allergy to CHG or antibacterial soaps.  If your skin becomes reddened/irritated stop using the CHG.  Do not shave (including legs and underarms) for at least 48 hours prior to first CHG shower. It is OK to shave your face.  Please follow these instructions carefully.   1. Shower the NIGHT BEFORE SURGERY and the MORNING OF SURGERY with CHG.   2. If you chose to wash your hair, wash your hair first as usual with your normal shampoo.  3. After you shampoo, rinse your hair and body thoroughly to remove the shampoo.  4. Use CHG as you would any other liquid soap. You can apply CHG directly to the skin and wash gently with a scrungie or a clean washcloth.   5. Apply the CHG Soap to your body ONLY FROM THE NECK DOWN.  Do not use on open wounds or open sores. Avoid contact with your eyes, ears, mouth and genitals (private parts). Wash Face and genitals (private parts)  with your normal soap.  6. Wash thoroughly, paying special attention to the area where your surgery will be performed.  7. Thoroughly rinse your body with warm water from the neck down.  8. DO NOT shower/wash with your normal soap after using and rinsing off the CHG Soap.  9. Pat yourself dry with a CLEAN TOWEL.  10. Wear CLEAN PAJAMAS to bed the night before surgery, wear comfortable clothes the morning of surgery  11. Place CLEAN  SHEETS on your bed the night of your first shower and DO NOT SLEEP WITH PETS.  Day of Surgery:  Do not apply any deodorants/lotions.  Please wear clean clothes to the hospital/surgery center.   Remember to brush your teeth WITH YOUR REGULAR TOOTHPASTE.   Please read over the following fact sheets that you were given.

## 2018-12-24 ENCOUNTER — Other Ambulatory Visit (HOSPITAL_COMMUNITY)
Admission: RE | Admit: 2018-12-24 | Discharge: 2018-12-24 | Disposition: A | Discharge status: Home / Self Care | Payer: BC Managed Care – PPO | Source: Ambulatory Visit | Attending: Neurosurgery | Admitting: Neurosurgery

## 2018-12-24 ENCOUNTER — Encounter (HOSPITAL_COMMUNITY)
Admission: RE | Admit: 2018-12-24 | Discharge: 2018-12-24 | Disposition: A | Discharge status: Home / Self Care | Payer: BC Managed Care – PPO | Source: Ambulatory Visit | Attending: Neurosurgery | Admitting: Neurosurgery

## 2018-12-24 ENCOUNTER — Other Ambulatory Visit: Payer: Self-pay

## 2018-12-24 ENCOUNTER — Encounter (HOSPITAL_COMMUNITY): Payer: Self-pay

## 2018-12-24 DIAGNOSIS — Z96651 Presence of right artificial knee joint: Secondary | ICD-10-CM | POA: Insufficient documentation

## 2018-12-24 DIAGNOSIS — Z79899 Other long term (current) drug therapy: Secondary | ICD-10-CM | POA: Diagnosis not present

## 2018-12-24 DIAGNOSIS — Z1159 Encounter for screening for other viral diseases: Secondary | ICD-10-CM | POA: Diagnosis not present

## 2018-12-24 DIAGNOSIS — M4310 Spondylolisthesis, site unspecified: Secondary | ICD-10-CM | POA: Diagnosis not present

## 2018-12-24 DIAGNOSIS — Z981 Arthrodesis status: Secondary | ICD-10-CM | POA: Insufficient documentation

## 2018-12-24 DIAGNOSIS — Z01818 Encounter for other preprocedural examination: Secondary | ICD-10-CM | POA: Insufficient documentation

## 2018-12-24 HISTORY — DX: Unspecified osteoarthritis, unspecified site: M19.90

## 2018-12-24 LAB — CBC WITH DIFFERENTIAL/PLATELET
Abs Immature Granulocytes: 0.02 10*3/uL (ref 0.00–0.07)
Basophils Absolute: 0.1 10*3/uL (ref 0.0–0.1)
Basophils Relative: 1 %
Eosinophils Absolute: 0.3 10*3/uL (ref 0.0–0.5)
Eosinophils Relative: 3 %
HCT: 49.2 % (ref 39.0–52.0)
Hemoglobin: 16.3 g/dL (ref 13.0–17.0)
Immature Granulocytes: 0 %
Lymphocytes Relative: 30 %
Lymphs Abs: 2.3 10*3/uL (ref 0.7–4.0)
MCH: 29.2 pg (ref 26.0–34.0)
MCHC: 33.1 g/dL (ref 30.0–36.0)
MCV: 88.2 fL (ref 80.0–100.0)
Monocytes Absolute: 0.7 10*3/uL (ref 0.1–1.0)
Monocytes Relative: 9 %
Neutro Abs: 4.5 10*3/uL (ref 1.7–7.7)
Neutrophils Relative %: 57 %
Platelets: 258 10*3/uL (ref 150–400)
RBC: 5.58 MIL/uL (ref 4.22–5.81)
RDW: 12.1 % (ref 11.5–15.5)
WBC: 7.9 10*3/uL (ref 4.0–10.5)
nRBC: 0 % (ref 0.0–0.2)

## 2018-12-24 LAB — SURGICAL PCR SCREEN
MRSA, PCR: NEGATIVE
Staphylococcus aureus: NEGATIVE

## 2018-12-24 LAB — TYPE AND SCREEN
ABO/RH(D): B NEG
Antibody Screen: NEGATIVE

## 2018-12-24 LAB — ABO/RH: ABO/RH(D): B NEG

## 2018-12-24 NOTE — Progress Notes (Addendum)
PCP - Guadalupe Maple MD Cardiologist - pt denies  Chest x-ray - pt denies past year EKG - pt denies any cardiac hx, reports ECHO was ordered by his PCP after an EKG was incorrectly interpreted.  Will await ECHO results and have anesthesia determine if they would like an ECG  Stress Test - pt denies ECHO - 2017 - normal per patient-Care Everywhere -requested [facility] Cardiology  Cardiac Cath - pt denies  Sleep Study - pt denies CPAP - n/a  Fasting Blood Sugar - n/a Checks Blood Sugar _____ times a day-n/a  Blood Thinner Instructions: n/a Aspirin Instructions: n/a  Anesthesia review: Yes, pending ECHO  Patient denies shortness of breath, fever, cough and chest pain at PAT appointment  Patient verbalized understanding of instructions that were given to them at the PAT appointment. Patient was also instructed that they will need to review over the PAT instructions again at home before surgery.

## 2018-12-24 NOTE — Progress Notes (Signed)
Anesthesia Chart Review:  Case: [id number] Date/Time: 12/28/18 0745   Procedure: PLIF - L4-L5 (N/A Back)   Anesthesia type: General   Pre-op diagnosis: Spondylolisthesis   Location: [facility] ROOM 19 / [facility]   Surgeon: Julio SicksPool, Henry, MD      DISCUSSION: Patient is a 58 year old male scheduled for the above procedure.  History includes never smoker, arthritis, right TKA 06/2016, neck surgery (bilateral cervical laminectomy C3-4 02/26/17, ACDF 10/15/17), bilateral thoracic laminectomy T10-11 with decompression and excision of ruptured disc (02/26/16).  He denied chest pain, shortness of breath, fever, cough at PAT RN visit.  Presurgical COVID test from 12/24/2018 is still in process.  If negative and otherwise no acute changes and I would anticipate that he can proceed as planned.   VS: BP 130/83   Pulse 88   Temp (!) 36.3 C   Resp 20   Ht 5\' 11"  (1.803 m)   Wt 96.2 kg   SpO2 98%   BMI 29.57 kg/m     PROVIDERS: [name], [name], MD is PCP -He is not routinely followed by cardiologist, but he was evaluated by Gwen HerKhawaja, Usman, [facility] for preoperative evaluation prior to knee surgery given some dyspnea with moderate activity, but no chest pain. EKG was normal.  Echo showed normal LVEF with no significant valvular abnormalities. He was felt low risk for his knee surgery.    LABS: Labs reviewed: Acceptable for surgery. (all labs ordered are listed, but only abnormal results are displayed)  Labs Reviewed  SURGICAL PCR SCREEN  CBC WITH DIFFERENTIAL/PLATELET  TYPE AND SCREEN    IMAGES: MRI L-spine 11/30/18 ([facility]): IMPRESSION: 1.  L4-5 postsurgical changes reflecting laminectomy with enhancement seen throughout the laminectomy defects with adjacent marked bilateral facet arthropathy and complex facet joint effusions and cystic change. Right anterior facet joint cysts  measuring up to 14 mm and left anterior measuring up to 13 mm. In addition diffuse disc bulge  causes moderate to severe central canal stenosis which appears increased compared to February 09, 2017.  2. L5-S1 12 mm of grade 1 anterolisthesis is unchanged. There is marked bilateral facet arthropathy with disc uncovering causing moderate central canal stenosis increased compared to prior with severe bilateral neural foraminal narrowing.   EKG: N/A ([facility] showed NSR)   CV: Echo 06/05/16 [facility]): Summary: The study was technically adequate. There is normal LV wall thickness.  The LV is normal in size.Left ventricular systolic function is normal.  Ejection fraction is > 55%.  The transmitral spectral Doppler flow pattern is suggestive of impaired LV relaxation.  No regional wall motion abnormalities noted.  The right ventricle is normal in size and function.  Interatrial septum is intact with no evidence of atrial septal defect.  There is trace mitral regurgitation.  There is trace tricuspid regurgitation.  Right ventricular systolic pressure is normal.  There is trace pulmonic regurgitation.  The aortic root is normal size.   Past Medical History:  Diagnosis Date  . Arthritis     Past Surgical History:  Procedure Laterality Date  . ANTERIOR CERVICAL DECOMP/DISCECTOMY FUSION  10/15/2017   Dr. Jordan LikesPool  . JOINT REPLACEMENT    . left TKR Left 06/2016   Dr. Charlann Boxerlin  . posterior cervical disc fusion  02/26/2017  . THORACIC DISC SURGERY     2017, 2 ruptured discs, had emergency surgery    MEDICATIONS: . cholecalciferol (VITAMIN D3) 25 MCG (1000 UT) tablet  . docusate sodium (  COLACE) 100 MG capsule  . HYDROcodone-acetaminophen (NORCO/VICODIN) 5-325 MG tablet  . magnesium oxide (MAG-OX) 400 MG tablet  . meloxicam (MOBIC) 15 MG tablet  . Misc Natural Products (GLUCOSAMINE CHOND MSM FORMULA PO)  . Multiple Vitamins-Minerals (MULTIVITAMIN WITH MINERALS) tablet  . Omega-3 Fatty Acids (FISH OIL) 1000 MG CAPS  . oxymetazoline (AFRIN) 0.05 % nasal spray   No  current facility-administered medications for this encounter.     Myra Gianotti, PA-C Surgical Short Stay/Anesthesiology [facility] Phone [phone] [facility] Phone [phone] 12/24/2018 5:32 PM

## 2018-12-24 NOTE — Anesthesia Preprocedure Evaluation (Addendum)
Anesthesia Evaluation  Patient identified by MRN, date of birth, ID band Patient awake    Reviewed: Allergy & Precautions, NPO status , Patient's Chart, lab work & pertinent test results  Airway Mallampati: II  TM Distance: >3 FB Neck ROM: Full    Dental  (+) Dental Advisory Given, Partial Upper,    Pulmonary neg pulmonary ROS,    breath sounds clear to auscultation       Cardiovascular negative cardio ROS   Rhythm:Regular Rate:Normal     Neuro/Psych    GI/Hepatic Neg liver ROS,   Endo/Other    Renal/GU negative Renal ROS     Musculoskeletal  (+) Arthritis ,   Abdominal Normal abdominal exam  (+)   Peds  Hematology   Anesthesia Other Findings   Reproductive/Obstetrics                           Anesthesia Physical Anesthesia Plan  ASA: II  Anesthesia Plan: General   Post-op Pain Management:    Induction: Intravenous  PONV Risk Score and Plan: 3 and Ondansetron, Dexamethasone and Midazolam  Airway Management Planned: Oral ETT  Additional Equipment: None  Intra-op Plan:   Post-operative Plan: Extubation in OR  Informed Consent: I have reviewed the patients History and Physical, chart, labs and discussed the procedure including the risks, benefits and alternatives for the proposed anesthesia with the patient or authorized representative who has indicated his/her understanding and acceptance.     Dental advisory given  Plan Discussed with: CRNA  Anesthesia Plan Comments: (PAT note written 12/24/2018 by Myra Gianotti, PA-C. )     Anesthesia Quick Evaluation

## 2018-12-25 LAB — SARS CORONAVIRUS 2 (TAT 6-24 HRS): SARS Coronavirus 2: NEGATIVE

## 2018-12-28 ENCOUNTER — Inpatient Hospital Stay (HOSPITAL_COMMUNITY): Payer: BC Managed Care – PPO | Admitting: Anesthesiology

## 2018-12-28 ENCOUNTER — Encounter (HOSPITAL_COMMUNITY)
Admission: RE | Disposition: A | Discharge status: Home / Self Care | Payer: Self-pay | Source: Home / Self Care | Attending: Neurosurgery

## 2018-12-28 ENCOUNTER — Inpatient Hospital Stay (HOSPITAL_COMMUNITY): Payer: BC Managed Care – PPO | Admitting: Vascular Surgery

## 2018-12-28 ENCOUNTER — Inpatient Hospital Stay (HOSPITAL_COMMUNITY)
Admission: RE | Admit: 2018-12-28 | Discharge: 2018-12-29 | DRG: 455 | Disposition: A | Discharge status: Home / Self Care | Payer: BC Managed Care – PPO | Attending: Neurosurgery | Admitting: Neurosurgery

## 2018-12-28 ENCOUNTER — Other Ambulatory Visit: Payer: Self-pay

## 2018-12-28 ENCOUNTER — Inpatient Hospital Stay (HOSPITAL_COMMUNITY): Payer: BC Managed Care – PPO

## 2018-12-28 ENCOUNTER — Encounter (HOSPITAL_COMMUNITY): Payer: Self-pay | Admitting: *Deleted

## 2018-12-28 DIAGNOSIS — F17220 Nicotine dependence, chewing tobacco, uncomplicated: Secondary | ICD-10-CM | POA: Diagnosis present

## 2018-12-28 DIAGNOSIS — Z96652 Presence of left artificial knee joint: Secondary | ICD-10-CM | POA: Diagnosis not present

## 2018-12-28 DIAGNOSIS — Z515 Encounter for palliative care: Secondary | ICD-10-CM | POA: Diagnosis present

## 2018-12-28 DIAGNOSIS — Z791 Long term (current) use of non-steroidal anti-inflammatories (NSAID): Secondary | ICD-10-CM | POA: Diagnosis not present

## 2018-12-28 DIAGNOSIS — Z79899 Other long term (current) drug therapy: Secondary | ICD-10-CM | POA: Diagnosis not present

## 2018-12-28 DIAGNOSIS — M438X6 Other specified deforming dorsopathies, lumbar region: Secondary | ICD-10-CM | POA: Diagnosis not present

## 2018-12-28 DIAGNOSIS — M5116 Intervertebral disc disorders with radiculopathy, lumbar region: Secondary | ICD-10-CM | POA: Diagnosis not present

## 2018-12-28 DIAGNOSIS — M4310 Spondylolisthesis, site unspecified: Secondary | ICD-10-CM | POA: Diagnosis present

## 2018-12-28 DIAGNOSIS — M549 Dorsalgia, unspecified: Secondary | ICD-10-CM | POA: Diagnosis not present

## 2018-12-28 DIAGNOSIS — M4316 Spondylolisthesis, lumbar region: Principal | ICD-10-CM | POA: Diagnosis present

## 2018-12-28 DIAGNOSIS — M4317 Spondylolisthesis, lumbosacral region: Secondary | ICD-10-CM | POA: Diagnosis not present

## 2018-12-28 DIAGNOSIS — Z981 Arthrodesis status: Secondary | ICD-10-CM

## 2018-12-28 DIAGNOSIS — M4807 Spinal stenosis, lumbosacral region: Secondary | ICD-10-CM | POA: Diagnosis present

## 2018-12-28 DIAGNOSIS — Z419 Encounter for procedure for purposes other than remedying health state, unspecified: Secondary | ICD-10-CM

## 2018-12-28 SURGERY — POSTERIOR LUMBAR FUSION 1 LEVEL
Anesthesia: General | Site: Back

## 2018-12-28 MED ORDER — THROMBIN 20000 UNITS EX SOLR
CUTANEOUS | Status: AC
  Filled 2018-12-28: qty 20000

## 2018-12-28 MED ORDER — SODIUM CHLORIDE 0.9 % IV SOLN: 250.0000 mL | INTRAVENOUS | Status: DC

## 2018-12-28 MED ORDER — VANCOMYCIN HCL 1 G IV SOLR
INTRAVENOUS | Status: DC | PRN
  Administered 2018-12-28: 1000 mg via TOPICAL

## 2018-12-28 MED ORDER — LIDOCAINE 2% (20 MG/ML) 5 ML SYRINGE
INTRAMUSCULAR | Status: AC
  Filled 2018-12-28: qty 5

## 2018-12-28 MED ORDER — LACTATED RINGERS IV SOLN
INTRAVENOUS | Status: DC | PRN
  Administered 2018-12-28 (×3): via INTRAVENOUS

## 2018-12-28 MED ORDER — LACTATED RINGERS IV SOLN: INTRAVENOUS | Status: DC

## 2018-12-28 MED ORDER — ALBUMIN HUMAN 5 % IV SOLN
INTRAVENOUS | Status: DC | PRN
  Administered 2018-12-28: 11:00:00 via INTRAVENOUS

## 2018-12-28 MED ORDER — ONDANSETRON HCL 4 MG/2ML IJ SOLN
INTRAMUSCULAR | Status: AC
  Filled 2018-12-28: qty 2

## 2018-12-28 MED ORDER — SUGAMMADEX SODIUM 500 MG/5ML IV SOLN
INTRAVENOUS | Status: AC
  Filled 2018-12-28: qty 5

## 2018-12-28 MED ORDER — ROCURONIUM BROMIDE 10 MG/ML (PF) SYRINGE
PREFILLED_SYRINGE | INTRAVENOUS | Status: DC | PRN
  Administered 2018-12-28: 10 mg via INTRAVENOUS
  Administered 2018-12-28 (×2): 20 mg via INTRAVENOUS
  Administered 2018-12-28: 10 mg via INTRAVENOUS
  Administered 2018-12-28: 50 mg via INTRAVENOUS
  Administered 2018-12-28: 20 mg via INTRAVENOUS
  Administered 2018-12-28: 10 mg via INTRAVENOUS
  Administered 2018-12-28 (×2): 20 mg via INTRAVENOUS

## 2018-12-28 MED ORDER — ROCURONIUM BROMIDE 10 MG/ML (PF) SYRINGE
PREFILLED_SYRINGE | INTRAVENOUS | Status: AC
  Filled 2018-12-28: qty 20

## 2018-12-28 MED ORDER — CHLORHEXIDINE GLUCONATE CLOTH 2 % EX PADS: 6.0000 | MEDICATED_PAD | Freq: Once | CUTANEOUS | Status: DC

## 2018-12-28 MED ORDER — MELOXICAM 7.5 MG PO TABS
15.0000 mg | ORAL_TABLET | Freq: Every day | ORAL | Status: DC
  Administered 2018-12-28 – 2018-12-29 (×2): 15 mg via ORAL
  Filled 2018-12-28 (×2): qty 2

## 2018-12-28 MED ORDER — POLYETHYLENE GLYCOL 3350 17 G PO PACK: 17.0000 g | PACK | Freq: Every day | ORAL | Status: DC | PRN

## 2018-12-28 MED ORDER — VANCOMYCIN HCL 1000 MG IV SOLR
INTRAVENOUS | Status: AC
  Filled 2018-12-28: qty 1000

## 2018-12-28 MED ORDER — BUPIVACAINE HCL (PF) 0.25 % IJ SOLN
INTRAMUSCULAR | Status: AC
  Filled 2018-12-28: qty 30

## 2018-12-28 MED ORDER — HYDROMORPHONE HCL 1 MG/ML IJ SOLN
0.2500 mg | INTRAMUSCULAR | Status: DC | PRN
  Administered 2018-12-28: 0.25 mg via INTRAVENOUS
  Administered 2018-12-28: 0.5 mg via INTRAVENOUS
  Administered 2018-12-28: 0.25 mg via INTRAVENOUS

## 2018-12-28 MED ORDER — ACETAMINOPHEN 160 MG/5ML PO SOLN: 325.0000 mg | Freq: Once | ORAL | Status: DC | PRN

## 2018-12-28 MED ORDER — MAGNESIUM OXIDE 400 (241.3 MG) MG PO TABS
400.0000 mg | ORAL_TABLET | Freq: Every day | ORAL | Status: DC
  Administered 2018-12-28 – 2018-12-29 (×2): 400 mg via ORAL
  Filled 2018-12-28 (×2): qty 1

## 2018-12-28 MED ORDER — FENTANYL CITRATE (PF) 250 MCG/5ML IJ SOLN
INTRAMUSCULAR | Status: DC | PRN
  Administered 2018-12-28 (×2): 100 ug via INTRAVENOUS
  Administered 2018-12-28: 50 ug via INTRAVENOUS
  Administered 2018-12-28: 100 ug via INTRAVENOUS
  Administered 2018-12-28: 150 ug via INTRAVENOUS

## 2018-12-28 MED ORDER — ADULT MULTIVITAMIN W/MINERALS CH
1.0000 | ORAL_TABLET | Freq: Every day | ORAL | Status: DC
  Administered 2018-12-28 – 2018-12-29 (×2): 1 via ORAL
  Filled 2018-12-28 (×2): qty 1

## 2018-12-28 MED ORDER — SODIUM CHLORIDE 0.9 % IV SOLN
INTRAVENOUS | Status: DC | PRN
  Administered 2018-12-28: 500 mL

## 2018-12-28 MED ORDER — OMEGA-3-ACID ETHYL ESTERS 1 G PO CAPS
1.0000 g | ORAL_CAPSULE | Freq: Two times a day (BID) | ORAL | Status: DC
  Administered 2018-12-28 – 2018-12-29 (×2): 1 g via ORAL
  Filled 2018-12-28 (×2): qty 1

## 2018-12-28 MED ORDER — LIDOCAINE 2% (20 MG/ML) 5 ML SYRINGE
INTRAMUSCULAR | Status: DC | PRN
  Administered 2018-12-28: 60 mg via INTRAVENOUS

## 2018-12-28 MED ORDER — DEXAMETHASONE SODIUM PHOSPHATE 10 MG/ML IJ SOLN
10.0000 mg | INTRAMUSCULAR | Status: AC
  Administered 2018-12-28: 10 mg via INTRAVENOUS
  Filled 2018-12-28: qty 1

## 2018-12-28 MED ORDER — CEFAZOLIN SODIUM-DEXTROSE 1-4 GM/50ML-% IV SOLN
1.0000 g | Freq: Three times a day (TID) | INTRAVENOUS | Status: AC
  Administered 2018-12-28 – 2018-12-29 (×2): 1 g via INTRAVENOUS
  Filled 2018-12-28 (×2): qty 50

## 2018-12-28 MED ORDER — MIDAZOLAM HCL 2 MG/2ML IJ SOLN
INTRAMUSCULAR | Status: AC
  Filled 2018-12-28: qty 2

## 2018-12-28 MED ORDER — HYDROMORPHONE HCL 1 MG/ML IJ SOLN: 1.0000 mg | INTRAMUSCULAR | Status: DC | PRN

## 2018-12-28 MED ORDER — SODIUM CHLORIDE 0.9 % IV SOLN
INTRAVENOUS | Status: DC | PRN
  Administered 2018-12-28: 25 ug/min via INTRAVENOUS

## 2018-12-28 MED ORDER — MIDAZOLAM HCL 5 MG/5ML IJ SOLN
INTRAMUSCULAR | Status: DC | PRN
  Administered 2018-12-28: 2 mg via INTRAVENOUS

## 2018-12-28 MED ORDER — BUPIVACAINE HCL (PF) 0.25 % IJ SOLN
INTRAMUSCULAR | Status: DC | PRN
  Administered 2018-12-28: 20 mL

## 2018-12-28 MED ORDER — ONDANSETRON HCL 4 MG/2ML IJ SOLN
INTRAMUSCULAR | Status: DC | PRN
  Administered 2018-12-28: 4 mg via INTRAVENOUS

## 2018-12-28 MED ORDER — EPHEDRINE SULFATE 50 MG/ML IJ SOLN
INTRAMUSCULAR | Status: DC | PRN
  Administered 2018-12-28: 5 mg via INTRAVENOUS

## 2018-12-28 MED ORDER — ALBUTEROL SULFATE HFA 108 (90 BASE) MCG/ACT IN AERS
INHALATION_SPRAY | RESPIRATORY_TRACT | Status: AC
  Filled 2018-12-28: qty 6.7

## 2018-12-28 MED ORDER — BISACODYL 10 MG RE SUPP: 10.0000 mg | Freq: Every day | RECTAL | Status: DC | PRN

## 2018-12-28 MED ORDER — 0.9 % SODIUM CHLORIDE (POUR BTL) OPTIME
TOPICAL | Status: DC | PRN
  Administered 2018-12-28: 1000 mL

## 2018-12-28 MED ORDER — VITAMIN D 25 MCG (1000 UNIT) PO TABS
1000.0000 [IU] | ORAL_TABLET | Freq: Every day | ORAL | Status: DC
  Administered 2018-12-28 – 2018-12-29 (×2): 1000 [IU] via ORAL
  Filled 2018-12-28 (×2): qty 1

## 2018-12-28 MED ORDER — MENTHOL 3 MG MT LOZG: 1.0000 | LOZENGE | OROMUCOSAL | Status: DC | PRN

## 2018-12-28 MED ORDER — FENTANYL CITRATE (PF) 250 MCG/5ML IJ SOLN
INTRAMUSCULAR | Status: AC
  Filled 2018-12-28: qty 5

## 2018-12-28 MED ORDER — PROPOFOL 10 MG/ML IV BOLUS
INTRAVENOUS | Status: AC
  Filled 2018-12-28: qty 20

## 2018-12-28 MED ORDER — ACETAMINOPHEN 325 MG PO TABS
650.0000 mg | ORAL_TABLET | ORAL | Status: DC | PRN
  Administered 2018-12-28: 650 mg via ORAL
  Filled 2018-12-28: qty 2

## 2018-12-28 MED ORDER — PHENYLEPHRINE 40 MCG/ML (10ML) SYRINGE FOR IV PUSH (FOR BLOOD PRESSURE SUPPORT)
PREFILLED_SYRINGE | INTRAVENOUS | Status: DC | PRN
  Administered 2018-12-28: 80 ug via INTRAVENOUS
  Administered 2018-12-28 (×2): 40 ug via INTRAVENOUS
  Administered 2018-12-28: 80 ug via INTRAVENOUS

## 2018-12-28 MED ORDER — HYDROMORPHONE HCL 1 MG/ML IJ SOLN
INTRAMUSCULAR | Status: AC
  Filled 2018-12-28: qty 1

## 2018-12-28 MED ORDER — ACETAMINOPHEN 325 MG PO TABS: 325.0000 mg | ORAL_TABLET | Freq: Once | ORAL | Status: DC | PRN

## 2018-12-28 MED ORDER — DOCUSATE SODIUM 100 MG PO CAPS
200.0000 mg | ORAL_CAPSULE | Freq: Every day | ORAL | Status: DC
  Administered 2018-12-28 – 2018-12-29 (×2): 200 mg via ORAL
  Filled 2018-12-28 (×2): qty 2

## 2018-12-28 MED ORDER — DIAZEPAM 5 MG PO TABS
5.0000 mg | ORAL_TABLET | Freq: Four times a day (QID) | ORAL | Status: DC | PRN
  Administered 2018-12-28 – 2018-12-29 (×2): 5 mg via ORAL
  Filled 2018-12-28 (×2): qty 1

## 2018-12-28 MED ORDER — PHENOL 1.4 % MT LIQD: 1.0000 | OROMUCOSAL | Status: DC | PRN

## 2018-12-28 MED ORDER — SODIUM CHLORIDE 0.9% FLUSH: 3.0000 mL | Freq: Two times a day (BID) | INTRAVENOUS | Status: DC

## 2018-12-28 MED ORDER — SUGAMMADEX SODIUM 200 MG/2ML IV SOLN
INTRAVENOUS | Status: DC | PRN
  Administered 2018-12-28: 300 mg via INTRAVENOUS

## 2018-12-28 MED ORDER — PROPOFOL 10 MG/ML IV BOLUS
INTRAVENOUS | Status: DC | PRN
  Administered 2018-12-28: 140 mg via INTRAVENOUS

## 2018-12-28 MED ORDER — SODIUM CHLORIDE 0.9% FLUSH: 3.0000 mL | INTRAVENOUS | Status: DC | PRN

## 2018-12-28 MED ORDER — ACETAMINOPHEN 10 MG/ML IV SOLN: 1000.0000 mg | Freq: Once | INTRAVENOUS | Status: DC | PRN

## 2018-12-28 MED ORDER — ACETAMINOPHEN 650 MG RE SUPP: 650.0000 mg | RECTAL | Status: DC | PRN

## 2018-12-28 MED ORDER — OXYCODONE HCL 5 MG PO TABS
10.0000 mg | ORAL_TABLET | ORAL | Status: DC | PRN
  Administered 2018-12-28: 5 mg via ORAL
  Filled 2018-12-28 (×2): qty 2

## 2018-12-28 MED ORDER — ONDANSETRON HCL 4 MG/2ML IJ SOLN: 4.0000 mg | Freq: Four times a day (QID) | INTRAMUSCULAR | Status: DC | PRN

## 2018-12-28 MED ORDER — PROMETHAZINE HCL 25 MG/ML IJ SOLN: 6.2500 mg | INTRAMUSCULAR | Status: DC | PRN

## 2018-12-28 MED ORDER — THROMBIN 20000 UNITS EX SOLR
CUTANEOUS | Status: DC | PRN
  Administered 2018-12-28 (×2): 20 mL via TOPICAL

## 2018-12-28 MED ORDER — FLEET ENEMA 7-19 GM/118ML RE ENEM: 1.0000 | ENEMA | Freq: Once | RECTAL | Status: DC | PRN

## 2018-12-28 MED ORDER — MEPERIDINE HCL 25 MG/ML IJ SOLN: 6.2500 mg | INTRAMUSCULAR | Status: DC | PRN

## 2018-12-28 MED ORDER — ROCURONIUM BROMIDE 10 MG/ML (PF) SYRINGE
PREFILLED_SYRINGE | INTRAVENOUS | Status: AC
  Filled 2018-12-28: qty 10

## 2018-12-28 MED ORDER — GLUCOSAMINE CHOND MSM FORMULA PO TABS: ORAL_TABLET | Freq: Every day | ORAL | Status: DC

## 2018-12-28 MED ORDER — HYDROCODONE-ACETAMINOPHEN 10-325 MG PO TABS
1.0000 | ORAL_TABLET | ORAL | Status: DC | PRN
  Administered 2018-12-28 – 2018-12-29 (×4): 1 via ORAL
  Filled 2018-12-28 (×4): qty 1

## 2018-12-28 MED ORDER — ONDANSETRON HCL 4 MG PO TABS: 4.0000 mg | ORAL_TABLET | Freq: Four times a day (QID) | ORAL | Status: DC | PRN

## 2018-12-28 MED ORDER — CEFAZOLIN SODIUM-DEXTROSE 2-4 GM/100ML-% IV SOLN
2.0000 g | INTRAVENOUS | Status: AC
  Administered 2018-12-28 (×2): 2 g via INTRAVENOUS
  Filled 2018-12-28: qty 100

## 2018-12-28 SURGICAL SUPPLY — 63 items
BAG DECANTER FOR FLEXI CONT (MISCELLANEOUS) ×3 IMPLANT
BENZOIN TINCTURE PRP APPL 2/3 (GAUZE/BANDAGES/DRESSINGS) ×3 IMPLANT
BLADE CLIPPER SURG (BLADE) IMPLANT
BUR CUTTER 7.0 ROUND (BURR) IMPLANT
BUR MATCHSTICK NEURO 3.0 LAGG (BURR) ×3 IMPLANT
CANISTER SUCT 3000ML PPV (MISCELLANEOUS) ×3 IMPLANT
CAP LCK SPNE (Orthopedic Implant) ×6 IMPLANT
CAP LOCK SPINE RADIUS (Orthopedic Implant) IMPLANT
CAP LOCKING (Orthopedic Implant) ×12 IMPLANT
CARTRIDGE OIL MAESTRO DRILL (MISCELLANEOUS) ×1 IMPLANT
CLOSURE WOUND 1/2 X4 (GAUZE/BANDAGES/DRESSINGS) ×1
CONT SPEC 4OZ CLIKSEAL STRL BL (MISCELLANEOUS) ×3 IMPLANT
COVER BACK TABLE 60X90IN (DRAPES) ×3 IMPLANT
COVER WAND RF STERILE (DRAPES) ×1 IMPLANT
DECANTER SPIKE VIAL GLASS SM (MISCELLANEOUS) ×3 IMPLANT
DERMABOND ADVANCED (GAUZE/BANDAGES/DRESSINGS) ×2
DERMABOND ADVANCED .7 DNX12 (GAUZE/BANDAGES/DRESSINGS) ×1 IMPLANT
DEVICE INTERBODY ELEVATE 23X10 (Cage) ×4 IMPLANT
DEVICE INTERBODY ELEVATE 23X8 (Cage) ×4 IMPLANT
DIFFUSER DRILL AIR PNEUMATIC (MISCELLANEOUS) ×3 IMPLANT
DRAPE C-ARM 42X72 X-RAY (DRAPES) ×8 IMPLANT
DRAPE HALF SHEET 40X57 (DRAPES) IMPLANT
DRAPE LAPAROTOMY 100X72X124 (DRAPES) ×3 IMPLANT
DRAPE SURG 17X23 STRL (DRAPES) ×12 IMPLANT
DRSG OPSITE POSTOP 4X6 (GAUZE/BANDAGES/DRESSINGS) ×3 IMPLANT
DURAPREP 26ML APPLICATOR (WOUND CARE) ×3 IMPLANT
ELECT REM PT RETURN 9FT ADLT (ELECTROSURGICAL) ×3
ELECTRODE REM PT RTRN 9FT ADLT (ELECTROSURGICAL) ×1 IMPLANT
EVACUATOR 1/8 PVC DRAIN (DRAIN) IMPLANT
GAUZE 4X4 16PLY RFD (DISPOSABLE) IMPLANT
GAUZE SPONGE 4X4 12PLY STRL (GAUZE/BANDAGES/DRESSINGS) IMPLANT
GLOVE ECLIPSE 9.0 STRL (GLOVE) ×6 IMPLANT
GLOVE EXAM NITRILE XL STR (GLOVE) IMPLANT
GOWN STRL REUS W/ TWL LRG LVL3 (GOWN DISPOSABLE) IMPLANT
GOWN STRL REUS W/ TWL XL LVL3 (GOWN DISPOSABLE) ×2 IMPLANT
GOWN STRL REUS W/TWL 2XL LVL3 (GOWN DISPOSABLE) IMPLANT
GOWN STRL REUS W/TWL LRG LVL3 (GOWN DISPOSABLE)
GOWN STRL REUS W/TWL XL LVL3 (GOWN DISPOSABLE) ×4
KIT BASIN OR (CUSTOM PROCEDURE TRAY) ×3 IMPLANT
KIT TURNOVER KIT B (KITS) ×3 IMPLANT
MILL MEDIUM DISP (BLADE) ×3 IMPLANT
NEEDLE HYPO 22GX1.5 SAFETY (NEEDLE) ×3 IMPLANT
NS IRRIG 1000ML POUR BTL (IV SOLUTION) ×3 IMPLANT
OIL CARTRIDGE MAESTRO DRILL (MISCELLANEOUS) ×3
PACK LAMINECTOMY NEURO (CUSTOM PROCEDURE TRAY) ×3 IMPLANT
ROD MAX 50MM (Rod) ×4 IMPLANT
SCREW 5.75 X 635 (Screw) ×2 IMPLANT
SCREW 5.75X40M (Screw) ×6 IMPLANT
SCREW 5.75X45MM (Screw) ×4 IMPLANT
SCREW 6.75X45MM (Screw) ×2 IMPLANT
SPACER SPNL XLORDOTIC 23X8X (Cage) IMPLANT
SPCR SPNL XLORDOTIC 23X8X (Cage) ×2 IMPLANT
SPONGE LAP 4X18 RFD (DISPOSABLE) ×6 IMPLANT
SPONGE SURGIFOAM ABS GEL 100 (HEMOSTASIS) ×5 IMPLANT
STRIP CLOSURE SKIN 1/2X4 (GAUZE/BANDAGES/DRESSINGS) ×3 IMPLANT
SUT VIC AB 0 CT1 18XCR BRD8 (SUTURE) ×2 IMPLANT
SUT VIC AB 0 CT1 8-18 (SUTURE) ×2
SUT VIC AB 2-0 CT1 18 (SUTURE) ×3 IMPLANT
SUT VIC AB 3-0 SH 8-18 (SUTURE) ×6 IMPLANT
TOWEL GREEN STERILE (TOWEL DISPOSABLE) ×3 IMPLANT
TOWEL GREEN STERILE FF (TOWEL DISPOSABLE) ×3 IMPLANT
TRAY FOLEY MTR SLVR 16FR STAT (SET/KITS/TRAYS/PACK) ×3 IMPLANT
WATER STERILE IRR 1000ML POUR (IV SOLUTION) ×3 IMPLANT

## 2018-12-28 NOTE — H&P (Signed)
Chase Gonzalez is an 58 y.o. male.   Chief Complaint: Back pain HPI: 58 year old male with severe chronic and progressive back pain with radiation to both lower extremities.  Symptoms aggravated by standing or walking.  Symptoms of failed conservative management including therapy and injections.  He is status post decompressive surgery at L4-5.  The patient states that his back pain is on level.  He has no progressive weakness.  He does have numbness and tingling into both distal lower extremities.  Work-up demonstrates evidence of severe facet arthropathy with facet joint diastases and some instability at L45.  At L5-S1 the patient has a grade 2 lytic spondylolisthesis with marked disc space collapse and severe neural foraminal stenosis.  He also has significant disc degeneration at L1-2, L2-3 and L3-4 but no evidence of severe stenosis of these levels.  Past Medical History:  Diagnosis Date  . Arthritis     Past Surgical History:  Procedure Laterality Date  . ANTERIOR CERVICAL DECOMP/DISCECTOMY FUSION  10/15/2017   Dr. Annette Stable  . JOINT REPLACEMENT    . left TKR Left 06/2016   Dr. Alvan Dame  . posterior cervical disc fusion  02/26/2017  . THORACIC [place] SURGERY     2017, 2 ruptured discs, had emergency surgery    History reviewed. No pertinent family history. Social History:  reports that he has never smoked. His smokeless tobacco use includes chew. He reports previous alcohol use. He reports previous drug use.  Allergies: No Known Allergies  Medications Prior to Admission  Medication Sig Dispense Refill  . cholecalciferol (VITAMIN D3) 25 MCG (1000 UT) tablet Take 1,000 Units by mouth daily.    Marland Kitchen docusate sodium (COLACE) 100 MG capsule Take 200 mg by mouth daily.    Marland Kitchen HYDROcodone-acetaminophen (NORCO/VICODIN) 5-325 MG tablet Take 1 tablet by mouth every 6 (six) hours as needed for moderate pain.    . magnesium oxide (MAG-OX) 400 MG tablet Take 400 mg by mouth daily.    . meloxicam (MOBIC) 15  MG tablet Take 15 mg by mouth daily.    . Misc Natural Products (GLUCOSAMINE CHOND MSM FORMULA PO) Take 1 tablet by mouth daily.    . Multiple Vitamins-Minerals (MULTIVITAMIN WITH MINERALS) tablet Take 1 tablet by mouth daily.    . Omega-3 Fatty Acids (FISH OIL) 1000 MG CAPS Take 1,000 mg by mouth daily.    Marland Kitchen oxymetazoline (AFRIN) 0.05 % nasal spray Place 1 spray into both nostrils 2 (two) times daily as needed for congestion.      No results found for this or any previous visit (from the past 48 hour(s)). No results found.  Pertinent items noted in HPI and remainder of comprehensive ROS otherwise negative.  Blood pressure (!) 139/94, pulse 66, temperature 98.8 F (37.1 C), temperature source Oral, resp. rate 20, height 5\' 11"  (1.803 m), weight 96.2 kg, SpO2 99 %.  Patient is awake and alert.  He is oriented and appropriate.  Speech is fluent.  Judgment insight are intact.  Cranial nerve function normal bilateral.  Motor examination extremities reveals some mild weakness of dorsiflexion bilaterally.  Remainder of his motor examination intact.  Sensory examination with decreased sensation pinprick and light touch in his L5 and S1 dermatomes bilateral.  Deep tendon axes are hypoactive in his patella and absent in both Achilles.  Examination head ears eyes nose throat is unremarked.  Chest and abdomen are benign.  Extremities are free from injury or deformity. Assessment/Plan L4-5 degenerative spondylolisthesis with marked facet arthropathy  and stenosis.  L5-S1 grade 2 lytic spondylolisthesis with severe foraminal stenosis and radiculopathy.  Plan bilateral L4-5 and L5-S1 decompressive laminotomies and foraminotomies followed by posterior lumbar interbody fusion utilizing interbody cages, local harvested autograft, and augmented with posterior lateral arthrodesis utilizing segmental pedicle screw fixation and local autografting.  Risks and benefits of been explained.  Patient wishes to  proceed.  Chase Gonzalez 12/28/2018, 7:15 AM

## 2018-12-28 NOTE — Transfer of Care (Signed)
Immediate Anesthesia Transfer of Care Note  Patient: Chase Gonzalez  Procedure(s) Performed: Posterior Lumbar Interbody Fusion - Lumbar Four-Lumbar Five, Lumbar Five-Sacral One (N/A Back)  Patient Location: PACU  Anesthesia Type:General  Level of Consciousness: awake and drowsy  Airway & Oxygen Therapy: Patient Spontanous Breathing and Patient connected to nasal cannula oxygen  Post-op Assessment: Report given to RN and Post -op Vital signs reviewed and stable  Post vital signs: Reviewed and stable  Last Vitals:  Vitals Value Taken Time  BP 131/69 12/28/18 1342  Temp 36.1 C 12/28/18 1340  Pulse 77 12/28/18 1344  Resp 12 12/28/18 1344  SpO2 99 % 12/28/18 1344  Vitals shown include unvalidated device data.  Last Pain:  Vitals:   12/28/18 1340  TempSrc:   PainSc: (P) Asleep      Patients Stated Pain Goal: 3 (60/60/04 5997)  Complications: No apparent anesthesia complications

## 2018-12-28 NOTE — Evaluation (Signed)
Physical Therapy Evaluation Patient Details Name: Chase Gonzalez MRN: [id number] DOB: 01-18-1961 Today's Date: 12/28/2018   History of Present Illness  Pt is a 58 y/o male s/p L4-S1 PLIF. PMH includes arthritis, L TKA, and s/p ACDF.   Clinical Impression  Patient is s/p above surgery resulting in the deficits listed below (see PT Problem List). Pt requiring min guard A for gait tasks using RW this session. Educated about back precautions and generalized walking program. Patient will benefit from skilled PT to increase their independence and safety with mobility (while adhering to their precautions) to allow discharge to the venue listed below.     Follow Up Recommendations No PT follow up;Supervision for mobility/OOB(would benefit from Outpatient PT once cleared by MD)    Equipment Recommendations  None recommended by PT    Recommendations for Other Services       Precautions / Restrictions Precautions Precautions: Back Precaution Booklet Issued: Yes (comment) Precaution Comments: Reviewed back precautions with pt.  Required Braces or Orthoses: Spinal Brace Spinal Brace: Lumbar corset;Applied in sitting position Restrictions Weight Bearing Restrictions: No      Mobility  Bed Mobility Overal bed mobility: Needs Assistance Bed Mobility: Rolling;Sidelying to Sit Rolling: Min assist Sidelying to sit: Min guard       General bed mobility comments: Min A for assist with rolling to the side. Min guard for safety to sit upright. Cues for use of log roll technique.   Transfers Overall transfer level: Needs assistance Equipment used: Straight cane;Rolling walker (2 wheeled) Transfers: Sit to/from Stand Sit to Stand: Min assist;Min guard         General transfer comment: Min A for lift assist and steadying to stand with use of cane. Pt reports feeling unsteady, so used RW on 2nd attempt to stand. Pt requiring min guard for safety with use of RW.    Ambulation/Gait Ambulation/Gait assistance: Min guard Gait Distance (Feet): 200 Feet Assistive device: Rolling walker (2 wheeled) Gait Pattern/deviations: Step-through pattern;Decreased stride length;Narrow base of support Gait velocity: Decreased    General Gait Details: Slow, guarded gait. Very narrow BOS and mild unsteadiness noted. Cues to widen BOS. Also required cues for upright posture and proximity to device. Educated about use of RW to improve stability at home.   Stairs            Wheelchair Mobility    Modified Rankin (Stroke Patients Only)       Balance Overall balance assessment: Needs assistance Sitting-balance support: No upper extremity supported;Feet supported Sitting balance-Leahy Scale: Good     Standing balance support: Bilateral upper extremity supported;During functional activity Standing balance-Leahy Scale: Poor Standing balance comment: Reliant on BUE support.                              Pertinent Vitals/Pain Pain Assessment: Faces Faces Pain Scale: Hurts even more Pain Location: back Pain Descriptors / Indicators: Aching;Operative site guarding Pain Intervention(s): Limited activity within patient's tolerance;Monitored during session;Repositioned    Home Living Family/patient expects to be discharged to:: Private residence Living Arrangements: Spouse/significant other Available Help at Discharge: Family;Available 24 hours/day Type of Home: House Home Access: Level entry     Home Layout: One level Home Equipment: Walker - 2 wheels;Cane - single point      Prior Function Level of Independence: Independent with assistive device(s)         Comments: Was using cane for ambulation  Hand Dominance        Extremity/Trunk Assessment   Upper Extremity Assessment Upper Extremity Assessment: Defer to OT evaluation    Lower Extremity Assessment Lower Extremity Assessment: Generalized weakness(reports pain in  BLE has improved)    Cervical / Trunk Assessment Cervical / Trunk Assessment: Other exceptions Cervical / Trunk Exceptions: s/p PLIF   Communication   Communication: No difficulties  Cognition Arousal/Alertness: Awake/alert Behavior During Therapy: WFL for tasks assessed/performed Overall Cognitive Status: Within Functional Limits for tasks assessed                                        General Comments General comments (skin integrity, edema, etc.): Educated about generalized walking program to perform at home.     Exercises     Assessment/Plan    PT Assessment Patient needs continued PT services  PT Problem List Decreased strength;Decreased balance;Decreased mobility;Decreased knowledge of use of DME;Decreased knowledge of precautions;Pain       PT Treatment Interventions DME instruction;Gait training;Functional mobility training;Therapeutic activities;Therapeutic exercise;Balance training;Patient/family education;Neuromuscular re-education    PT Goals (Current goals can be found in the Care Plan section)  Acute Rehab PT Goals Patient Stated Goal: to go home  PT Goal Formulation: With patient Time For Goal Achievement: 01/11/19 Potential to Achieve Goals: Good    Frequency Min 5X/week   Barriers to discharge        Co-evaluation               AM-PAC PT "6 Clicks" Mobility  Outcome Measure Help needed turning from your back to your side while in a flat bed without using bedrails?: A Little Help needed moving from lying on your back to sitting on the side of a flat bed without using bedrails?: A Little Help needed moving to and from a bed to a chair (including a wheelchair)?: A Little Help needed standing up from a chair using your arms (e.g., wheelchair or bedside chair)?: A Little Help needed to walk in hospital room?: A Little Help needed climbing 3-5 steps with a railing? : A Lot 6 Click Score: 17    End of Session Equipment Utilized  During Treatment: Gait belt;Back brace Activity Tolerance: Patient tolerated treatment well Patient left: in bed;with call bell/phone within reach(sitting EOB ) Nurse Communication: Mobility status PT Visit Diagnosis: Unsteadiness on feet (R26.81);Muscle weakness (generalized) (M62.81);Pain Pain - part of body: (back)    Time: [id number] PT Time Calculation (min) (ACUTE ONLY): 23 min   Charges:   PT Evaluation $PT Eval Low Complexity: 1 Low PT Treatments $Gait Training: 8-22 mins        Gladys DammeBrittany Dashawn Bartnick, PT, DPT  Acute Rehabilitation Services  Pager: [phone] Office: [phone]   Lehman PromBrittany S Frederik Standley 12/28/2018, 6:11 PM

## 2018-12-28 NOTE — Progress Notes (Signed)
Orthopedic Tech Progress Note Patient Details:  Chase Gonzalez 04-04-1961 [id number] Patient has brace. Patient ID: Chase Gonzalez, male   DOB: June 03, 1961, 58 y.o.   MRN: [id number]   Janit Pagan 12/28/2018, 2:40 PM

## 2018-12-28 NOTE — Anesthesia Procedure Notes (Signed)
Procedure Name: Intubation Date/Time: 12/28/2018 7:48 AM Performed by: Mariea Clonts, CRNA Pre-anesthesia Checklist: Patient identified, Emergency Drugs available, Suction available and Patient being monitored Patient Re-evaluated:Patient Re-evaluated prior to induction Oxygen Delivery Method: Circle System Utilized Preoxygenation: Pre-oxygenation with 100% oxygen Induction Type: IV induction Ventilation: Mask ventilation without difficulty Laryngoscope Size: Miller and 2 Grade View: Grade II Tube type: Oral Tube size: 8.0 mm Number of attempts: 1 Airway Equipment and Method: Stylet and Oral airway Placement Confirmation: ETT inserted through vocal cords under direct vision,  positive ETCO2 and breath sounds checked- equal and bilateral Secured at: 23 cm Tube secured with: Tape Dental Injury: Teeth and Oropharynx as per pre-operative assessment

## 2018-12-28 NOTE — Brief Op Note (Signed)
12/28/2018  1:32 PM  PATIENT:  Leland Her  58 y.o. male  PRE-OPERATIVE DIAGNOSIS:  Spondylolisthesis  POST-OPERATIVE DIAGNOSIS:  Spondylolisthesis  PROCEDURE:  Procedure(s) with comments: Posterior Lumbar Interbody Fusion - Lumbar Four-Lumbar Five, Lumbar Five-Sacral One (N/A) - Posterior Lumbar Interbody Fusion - Lumbar Four-Lumbar Five, Lumbar Five-Sacral One  SURGEON:  Surgeon(s) and Role:    * Earnie Larsson, MD - Primary  PHYSICIAN ASSISTANT:   ASSISTANTSReinaldo Meeker, NP   ANESTHESIA:   general  EBL:  900 mL   BLOOD ADMINISTERED:none  DRAINS: none   LOCAL MEDICATIONS USED:  MARCAINE     SPECIMEN:  No Specimen  DISPOSITION OF SPECIMEN:  N/A  COUNTS:  YES  TOURNIQUET:  * No tourniquets in log *  DICTATION: .Dragon Dictation  PLAN OF CARE: Admit to inpatient   PATIENT DISPOSITION:  PACU - hemodynamically stable.   Delay start of Pharmacological VTE agent (>24hrs) due to surgical blood loss or risk of bleeding: yes

## 2018-12-28 NOTE — Anesthesia Postprocedure Evaluation (Signed)
Anesthesia Post Note  Patient: Chase Gonzalez  Procedure(s) Performed: Posterior Lumbar Interbody Fusion - Lumbar Four-Lumbar Five, Lumbar Five-Sacral One (N/A Back)     Patient location during evaluation: PACU Anesthesia Type: General Level of consciousness: awake and alert Pain management: pain level controlled Vital Signs Assessment: post-procedure vital signs reviewed and stable Respiratory status: spontaneous breathing, nonlabored ventilation, respiratory function stable and patient connected to nasal cannula oxygen Cardiovascular status: blood pressure returned to baseline and stable Postop Assessment: no apparent nausea or vomiting Anesthetic complications: no    Last Vitals:  Vitals:   12/28/18 1425 12/28/18 1440  BP: (!) 147/91 137/79  Pulse: 86 85  Resp: 13 13  Temp:  36.8 C  SpO2: 96% 97%    Last Pain:  Vitals:   12/28/18 1440  TempSrc:   PainSc: 2                  Effie Berkshire

## 2018-12-28 NOTE — Progress Notes (Signed)
PHARMACIST - PHYSICIAN ORDER COMMUNICATION  CONCERNING: P&T Medication Policy on Herbal Medications  DESCRIPTION:  This patient's order for:  Glucosamine has been noted.  This product(s) is classified as an "herbal" or natural product. Due to a lack of definitive safety studies or FDA approval, nonstandard manufacturing practices, plus the potential risk of unknown drug-drug interactions while on inpatient medications, the Pharmacy and Therapeutics Committee does not permit the use of "herbal" or natural products of this type within [facility].   ACTION TAKEN: The pharmacy department is unable to verify this order at this time and your patient has been informed of this safety policy. Please reevaluate patient's clinical condition at discharge and address if the herbal or natural product(s) should be resumed at that time.   Alycia Rossetti, PharmD, BCPS  3:12 PM

## 2018-12-28 NOTE — Op Note (Signed)
Date of procedure: 12/28/2018  Date of dictation: Same  Service: Neurosurgery  Preoperative diagnosis: L4-5 unstable degenerative spondylolisthesis  L5-S1 grade 2 spondylolisthesis with severe foraminal stenosis  Postoperative diagnosis: Same  Procedure Name: Bilateral L4-5 redo decompressive laminotomies with redo decompressive foraminotomies, more than would be required for simple interbody fusion alone.  L5-S1 Gill procedure with complete L5 laminectomy and bilateral L5-S1 facetectomies  Ponte osteotomies for sagittal plane restoration L4-5, L5-S1  L4-5, L5-S1 posterior lumbar interbody fusion utilizing interbody cages and locally harvested autograft  L4-5 S1 posterior lateral arthrodesis utilizing segmental pedicle screw fixation and local autograft    Surgeon:Paw Karstens A.Monque Haggar, M.D.  Asst. Surgeon: Doran DurandBergman, NP  Anesthesia: General  Indication: 58 year old male with severe back and bilateral lower extremity sensory loss, weakness and extreme pain which is failed conservative management.  Work-up demonstrates evidence of prior decompressive surgery at L4-5 with marked facet diastases and facet arthropathy with evidence of dynamic anterior listhesis with flexion and extension.  Patient has a longstanding grade 2 L5S1 spondylolisthesis with severe foraminal stenosis.  He also has significant disc degeneration at L1-2, L2-3 and L3-4 but no areas of high-grade stenosis or structural malalignment at these areas.  Patient presents now for decompression and fusion at L4-5 and L5-S1.  Operative note: After induction anesthesia, patient positioned prone on the Wilson frame and appropriate padded.  Lumbar region prepped and draped sterilely.  Incision made overlying L4-5 and S1.  Dissection performed bilaterally.  Retractor placed.  Fluoroscopy used.  Levels confirmed.  Previous laminotomy sites at L4-5 were dissected free.  There were facet fractures bilaterally involving the inferior facets of  L4.  These were debrided and the facets were resected.  Superior facetectomies were also performed.  Ligament flavum and epidural scar were elevated and resected.  Wide decompressive foraminotomies were performed on the course exiting L4 and L5 nerve roots.  Facetectomies were extended further and ponte osteotomies were completed.  Bilateral epidural venous plexus coagulated and cut.  Discectomy was performed bilaterally.  Dissipates then prepared for interbody fusion.  The entire lamina and inferior facet complex of L5 was then resected using Leksell rongeurs Kerrison Rogers and high-speed drill consistent with a Gill type procedure.  Superior facetectomies of S1 were also performed.  Facetectomies were completed to complete ponte osteotomies bilaterally.  Ligament flavum elevated and resected.  Foraminotomies completed on the course exiting L5 and S1 nerve roots bilaterally.  Discectomy then performed bilaterally.  Dissipates then prepared for interbody fusion.  With a distractor placed in the patient's right side at L4-5 disc space was prepared for interbody fusion.  Soft tissue was removed.  A 10 mm extra lordotic expandable cage was then impacted into place and expanded to its full extent.  Distractor removed patient's right side.  The space prepared on the right side.  Morselized autograft packed in the interspace.  A second expandable cage was impacted in place and expanded to its full extent.  The procedure was then repeated at L5-S1 using 8 mm extra lordotic implants and locally harvested autograft.  Pedicles at L4-L5 and S1 were identified using surface landmarks and intraoperative fluoroscopy and superficial bone overlying the pedicle was then removed using high-speed drill.  Pedicle was then probed using a pedicle all each pedicle tract was then probed and found to be solid within bone.  Each pedicle tract was then tapped with a screw tapped.  Each screw temple was probed and found to be solidly for the  bone.  5.75 mm radius  brand screws from [employer] were placed bilaterally at L4-5 and S1.  Final images reveal good position of the cages and the hardware at the proper operative level with markedly improved alignment of the spine.  Wound is irrigated one final time.  Transverse processes and residual facet were decorticated as well as the sacral ala.  Morselized autograft was packed posterior laterally.  Short segment titanium rod placed over the screw heads at L4-5 and S1.  Locking caps placed over the screws.  Locking caps and engagedwith a construct under compression.  Vancomycin powder was left in deep wound space.  Gelfoam was placed over the laminotomy sites.  Wounds and closed in layers of Vicryl sutures.  Steri-Strips and sterile dressing were applied.  No apparent complications.  Patient tolerated the procedure well and he returns to the recovery room postop.

## 2018-12-29 DIAGNOSIS — Z515 Encounter for palliative care: Secondary | ICD-10-CM

## 2018-12-29 MED ORDER — DIAZEPAM 5 MG PO TABS: 5.0000 mg | ORAL_TABLET | Freq: Four times a day (QID) | ORAL | 0 refills | Status: DC | PRN

## 2018-12-29 MED ORDER — HYDROCODONE-ACETAMINOPHEN 10-325 MG PO TABS: 1.0000 | ORAL_TABLET | ORAL | 0 refills | Status: DC | PRN

## 2018-12-29 MED FILL — Sodium Chloride Irrigation Soln 0.9%: Qty: 3000 | Status: AC

## 2018-12-29 MED FILL — Heparin Sodium (Porcine) Inj 1000 Unit/ML: INTRAMUSCULAR | Qty: 30 | Status: AC

## 2018-12-29 MED FILL — Sodium Chloride IV Soln 0.9%: INTRAVENOUS | Qty: 1000 | Status: AC

## 2018-12-29 NOTE — Discharge Summary (Signed)
Physician Discharge Summary  Patient ID: Chase Gonzalez MRN: [id number] DOB/AGE: 09-05-1960 58 y.o.  Admit date: 12/28/2018 Discharge date: 12/29/2018  Admission Diagnoses:  Discharge Diagnoses:  Active Problems:   Degenerative spondylolisthesis   Hospice care patient   Palliative care patient   Discharged Condition: good  Hospital Course: Patient admitted to the hospital where underwent uncomplicated two-level lumbar decompression and fusion.  Postoperatively doing well.  Preoperative back and lower extremity pain much improved.  Standing and walking well.  Voiding without difficulty.  Ready for discharge home.  Consults:   Significant Diagnostic Studies:   Treatments:   Discharge Exam: Blood pressure 99/68, pulse (!) 102, temperature 98.2 F (36.8 C), temperature source Oral, resp. rate 18, height 5\' 11"  (1.803 m), weight 96.2 kg, SpO2 96 %. Awake and alert.  Oriented and appropriate.  Motor and sensory function extremities normal.  Wound clean and dry.  Chest and abdomen benign.  Disposition: Discharge disposition: 01-Home or Self Care        Allergies as of 12/29/2018   No Known Allergies     Medication List    STOP taking these medications   HYDROcodone-acetaminophen 5-325 MG tablet Commonly known as: NORCO/VICODIN Replaced by: HYDROcodone-acetaminophen 10-325 MG tablet     TAKE these medications   cholecalciferol 25 MCG (1000 UT) tablet Commonly known as: VITAMIN D3 Take 1,000 Units by mouth daily.   diazepam 5 MG tablet Commonly known as: VALIUM Take 1-2 tablets (5-10 mg total) by mouth every 6 (six) hours as needed for muscle spasms.   docusate sodium 100 MG capsule Commonly known as: COLACE Take 200 mg by mouth daily.   Fish Oil 1000 MG Caps Take 1,000 mg by mouth daily.   GLUCOSAMINE CHOND MSM FORMULA PO Take 1 tablet by mouth daily.   HYDROcodone-acetaminophen 10-325 MG tablet Commonly known as: NORCO Take 1 tablet by mouth every 4 (four)  hours as needed for moderate pain ((score 4 to 6)). Replaces: HYDROcodone-acetaminophen 5-325 MG tablet   magnesium oxide 400 MG tablet Commonly known as: MAG-OX Take 400 mg by mouth daily.   meloxicam 15 MG tablet Commonly known as: MOBIC Take 15 mg by mouth daily.   multivitamin with minerals tablet Take 1 tablet by mouth daily.   oxymetazoline 0.05 % nasal spray Commonly known as: AFRIN Place 1 spray into both nostrils 2 (two) times daily as needed for congestion.            Durable Medical Equipment  (From admission, onward)         Start     Ordered   12/28/18 1509  DME Walker rolling  Once    Question:  Patient needs a walker to treat with the following condition  Answer:  Degenerative spondylolisthesis   12/28/18 1508   12/28/18 1509  DME 3 n 1  Once     12/28/18 1508           Signed: Cooper Render Vickey Ewbank 12/29/2018, 9:47 AM

## 2018-12-29 NOTE — Discharge Instructions (Signed)

## 2018-12-29 NOTE — Plan of Care (Signed)
Pt doing well. Pt and wife given D/C instructions with verbal understanding. Rx's were sent to pharmacy by MD. Pt's dressing was changed prior to D/C with minimal amount of drainage. Pt's IV was removed prior to D/C. Pt D/C'd home via wheelchair per MD order. Pt is stable @ D/C and has no other needs at this time. Holli Humbles, RN

## 2018-12-29 NOTE — Progress Notes (Signed)
Physical Therapy Treatment Patient Details Name: Chase Gonzalez MRN: [id number] DOB: September 17, 1960 Today's Date: 12/29/2018    History of Present Illness Pt is a 58 y/o male s/p L4-S1 PLIF. PMH includes arthritis, L TKA, and s/p ACDF.     PT Comments    Pt progressing well with post-op mobility. He was educated on precautions, brace application/wearing schedule, car transfer, and activity progression. Will continue to follow and progress as able per POC. Pt anticipates d/c home today.     Follow Up Recommendations  No PT follow up;Supervision for mobility/OOB(would benefit from Outpatient PT once cleared by MD)     Equipment Recommendations  None recommended by PT    Recommendations for Other Services       Precautions / Restrictions Precautions Precautions: Back Precaution Booklet Issued: Yes (comment) Precaution Comments: reinforced back precautions and back brace wearing schedule Required Braces or Orthoses: Spinal Brace Spinal Brace: Lumbar corset;Applied in sitting position Restrictions Weight Bearing Restrictions: No    Mobility  Bed Mobility Overal bed mobility: Needs Assistance Bed Mobility: Rolling;Sidelying to Sit Rolling: Supervision Sidelying to sit: Min assist       General bed mobility comments: Very minimal assist required for transition to full sitting position at EOB.   Transfers Overall transfer level: Modified independent Equipment used: Rolling walker (2 wheeled) Transfers: Sit to/from Stand Sit to Stand: Min assist;Min guard         General transfer comment: Pt with initial attempt of quick anterior lean and called out in pain, unable to achieve full stand. Pt was educated on scooting out more towards edge of bed and pushing up from bed to stand. Min assist provided to assist with power-up.  Ambulation/Gait Ambulation/Gait assistance: Min guard Gait Distance (Feet): 400 Feet Assistive device: Rolling walker (2 wheeled) Gait  Pattern/deviations: Step-through pattern;Decreased stride length;Narrow base of support Gait velocity: Decreased  Gait velocity interpretation: <1.8 ft/sec, indicate of risk for recurrent falls General Gait Details: Slow, guarded gait. Very narrow BOS and mild unsteadiness noted. Cues to widen BOS. Also required cues for upright posture and proximity to device. Educated about use of RW to improve stability at home.    Stairs Stairs: Yes Stairs assistance: Min guard Stair Management: Step to pattern;Forwards Number of Stairs: 5 General stair comments: VC's for sequencing and general safety. No assist required but close guard provided for safety.    Wheelchair Mobility    Modified Rankin (Stroke Patients Only)       Balance Overall balance assessment: Needs assistance Sitting-balance support: No upper extremity supported;Feet supported Sitting balance-Leahy Scale: Good     Standing balance support: Bilateral upper extremity supported;During functional activity Standing balance-Leahy Scale: Fair Standing balance comment: can release walker in static standing                            Cognition Arousal/Alertness: Awake/alert Behavior During Therapy: WFL for tasks assessed/performed Overall Cognitive Status: Within Functional Limits for tasks assessed                                        Exercises      General Comments        Pertinent Vitals/Pain Pain Assessment: Faces Faces Pain Scale: Hurts even more Pain Location: back Pain Descriptors / Indicators: Aching;Operative site guarding Pain Intervention(s): Monitored during session    Home  Living Family/patient expects to be discharged to:: Private residence Living Arrangements: Spouse/significant other Available Help at Discharge: Family;Available 24 hours/day Type of Home: House Home Access: Stairs to enter   Home Layout: One level Home Equipment: Environmental consultantWalker - 2 wheels;Cane - single  point;Grab bars - tub/shower;Adaptive equipment      Prior Function Level of Independence: Independent with assistive device(s)      Comments: Was using cane for ambulation    PT Goals (current goals can now be found in the care plan section) Acute Rehab PT Goals Patient Stated Goal: to go home  PT Goal Formulation: With patient Time For Goal Achievement: 01/11/19 Potential to Achieve Goals: Good Progress towards PT goals: Progressing toward goals    Frequency    Min 5X/week      PT Plan Current plan remains appropriate    Co-evaluation              AM-PAC PT "6 Clicks" Mobility   Outcome Measure  Help needed turning from your back to your side while in a flat bed without using bedrails?: A Little Help needed moving from lying on your back to sitting on the side of a flat bed without using bedrails?: A Little Help needed moving to and from a bed to a chair (including a wheelchair)?: A Little Help needed standing up from a chair using your arms (e.g., wheelchair or bedside chair)?: A Little Help needed to walk in hospital room?: A Little Help needed climbing 3-5 steps with a railing? : A Lot 6 Click Score: 17    End of Session Equipment Utilized During Treatment: Gait belt;Back brace Activity Tolerance: Patient tolerated treatment well Patient left: in bed;with call bell/phone within reach(sitting EOB ) Nurse Communication: Mobility status PT Visit Diagnosis: Unsteadiness on feet (R26.81);Muscle weakness (generalized) (M62.81);Pain Pain - part of body: (back)     Time: [id number] PT Time Calculation (min) (ACUTE ONLY): 21 min  Charges:  $Gait Training: 8-22 mins                     Chase SlipperLaura [name], PT, DPT Acute Rehabilitation Services Pager: [phone] Office: [phone]    Chase PearsonLaura D Corie Gonzalez 12/29/2018, 10:13 AM

## 2018-12-29 NOTE — Evaluation (Signed)
Occupational Therapy Evaluation Patient Details Name: Chase Gonzalez MRN: [id number] DOB: [birth year] Today's Date: 12/29/2018    History of Present Illness Pt is a 58 y/o male s/p L4-S1 PLIF. PMH includes arthritis, L TKA, and s/p ACDF.    Clinical Impression   Pt with hx of multiple spine surgeries and is aware of back precautions. Demonstrated ability to don and doff back brace. Pt owns and is knowledgeable in use of AE for LB dressing, will benefit from long handled bath sponge and possibly tongs for pericare. Instructed to avoid prolonged sitting and back brace wearing schedule. Pt will have 24 hour care of his wife upon return home. No further OT needs.    Follow Up Recommendations  No OT follow up    Equipment Recommendations  None recommended by OT    Recommendations for Other Services       Precautions / Restrictions Precautions Precautions: Back Precaution Comments: reinforced back precautions and back brace wearing schedule Required Braces or Orthoses: Spinal Brace Spinal Brace: Lumbar corset;Applied in sitting position Restrictions Weight Bearing Restrictions: No      Mobility Bed Mobility               General bed mobility comments: pt received in chair  Transfers Overall transfer level: Modified independent Equipment used: Rolling walker (2 wheeled)             General transfer comment: slow to rise, but no physical assist needed    Balance Overall balance assessment: Needs assistance   Sitting balance-Leahy Scale: Good       Standing balance-Leahy Scale: Fair Standing balance comment: can release walker in static standing                           ADL either performed or assessed with clinical judgement   ADL Overall ADL's : Modified independent                                       General ADL Comments: pt is knowledgeable in use of AE for LB dressing, recommended long handled bath sponge and tongs for  pericare, instructed in 2 cup method for oral care and wash cloth for face washing, pt does not participate in housekeeping at baseline. Instructed in how to safely transport items with RW. Pt verbalizing understanding of all information.      Vision Patient Visual Report: No change from baseline       Perception     Praxis      Pertinent Vitals/Pain Pain Assessment: Faces Faces Pain Scale: Hurts even more Pain Location: back Pain Descriptors / Indicators: Aching;Operative site guarding Pain Intervention(s): Monitored during session;Repositioned     Hand Dominance Right   Extremity/Trunk Assessment Upper Extremity Assessment Upper Extremity Assessment: Overall WFL for tasks assessed   Lower Extremity Assessment Lower Extremity Assessment: Defer to PT evaluation       Communication Communication Communication: No difficulties   Cognition Arousal/Alertness: Awake/alert Behavior During Therapy: WFL for tasks assessed/performed Overall Cognitive Status: Within Functional Limits for tasks assessed                                     General Comments       Exercises     Shoulder Instructions  Home Living Family/patient expects to be discharged to:: Private residence Living Arrangements: Spouse/significant other Available Help at Discharge: Family;Available 24 hours/day Type of Home: House Home Access: Stairs to enter [employer] of Steps: 1   Home Layout: One level     Bathroom Shower/Tub: Occupational psychologist: Handicapped height     Home Equipment: Environmental consultant - 2 wheels;Cane - single point;Grab bars - tub/shower;Adaptive equipment Adaptive Equipment: Reacher;Sock aid        Prior Functioning/Environment Level of Independence: Independent with assistive device(s)        Comments: Was using cane for ambulation         OT Problem List:        OT Treatment/Interventions:      OT Goals(Current goals can be  found in the care plan section) Acute Rehab OT Goals Patient Stated Goal: to go home   OT Frequency:     Barriers to D/C:            Co-evaluation              AM-PAC OT "6 Clicks" Daily Activity     Outcome Measure Help from another person eating meals?: None Help from another person taking care of personal grooming?: None Help from another person toileting, which includes using toliet, bedpan, or urinal?: None Help from another person bathing (including washing, rinsing, drying)?: None Help from another person to put on and taking off regular upper body clothing?: None Help from another person to put on and taking off regular lower body clothing?: None 6 Click Score: 24   End of Session Equipment Utilized During Treatment: Rolling walker;Back brace  Activity Tolerance: Patient tolerated treatment well Patient left: in chair;with call bell/phone within reach;with family/visitor present  OT Visit Diagnosis: Pain;Other abnormalities of gait and mobility (R26.89)                Time: 8563-1497 OT Time Calculation (min): 15 min Charges:  OT General Charges $OT Visit: 1 Visit OT Evaluation $OT Eval Low Complexity: 1 Low  Nestor Lewandowsky, OTR/L Acute Rehabilitation Services Pager: [phone] Office: [phone]  Malka So 12/29/2018, 8:53 AM

## 2019-01-07 ENCOUNTER — Other Ambulatory Visit: Payer: Self-pay

## 2019-01-07 ENCOUNTER — Encounter (HOSPITAL_COMMUNITY): Payer: Self-pay | Admitting: Emergency Medicine

## 2019-01-07 ENCOUNTER — Inpatient Hospital Stay (HOSPITAL_COMMUNITY)
Admission: EM | Admit: 2019-01-07 | Discharge: 2019-01-12 | DRG: 948 | Disposition: A | Discharge status: Home / Self Care | Payer: BC Managed Care – PPO | Attending: Neurosurgery | Admitting: Neurosurgery

## 2019-01-07 ENCOUNTER — Emergency Department (HOSPITAL_COMMUNITY): Payer: BC Managed Care – PPO

## 2019-01-07 DIAGNOSIS — R509 Fever, unspecified: Secondary | ICD-10-CM | POA: Diagnosis not present

## 2019-01-07 DIAGNOSIS — M4310 Spondylolisthesis, site unspecified: Secondary | ICD-10-CM

## 2019-01-07 DIAGNOSIS — Z20828 Contact with and (suspected) exposure to other viral communicable diseases: Secondary | ICD-10-CM | POA: Diagnosis present

## 2019-01-07 DIAGNOSIS — M549 Dorsalgia, unspecified: Secondary | ICD-10-CM | POA: Diagnosis not present

## 2019-01-07 DIAGNOSIS — G8918 Other acute postprocedural pain: Principal | ICD-10-CM | POA: Diagnosis present

## 2019-01-07 DIAGNOSIS — Z96651 Presence of right artificial knee joint: Secondary | ICD-10-CM | POA: Diagnosis present

## 2019-01-07 DIAGNOSIS — Z981 Arthrodesis status: Secondary | ICD-10-CM | POA: Diagnosis not present

## 2019-01-07 DIAGNOSIS — Z720 Tobacco use: Secondary | ICD-10-CM | POA: Diagnosis not present

## 2019-01-07 DIAGNOSIS — M48061 Spinal stenosis, lumbar region without neurogenic claudication: Secondary | ICD-10-CM | POA: Diagnosis not present

## 2019-01-07 DIAGNOSIS — M1990 Unspecified osteoarthritis, unspecified site: Secondary | ICD-10-CM | POA: Diagnosis present

## 2019-01-07 DIAGNOSIS — M545 Low back pain: Secondary | ICD-10-CM | POA: Diagnosis not present

## 2019-01-07 LAB — CBC WITH DIFFERENTIAL/PLATELET
Abs Immature Granulocytes: 0.12 10*3/uL — ABNORMAL HIGH (ref 0.00–0.07)
Basophils Absolute: 0 10*3/uL (ref 0.0–0.1)
Basophils Relative: 0 %
Eosinophils Absolute: 0 10*3/uL (ref 0.0–0.5)
Eosinophils Relative: 0 %
HCT: 37.8 % — ABNORMAL LOW (ref 39.0–52.0)
Hemoglobin: 12.2 g/dL — ABNORMAL LOW (ref 13.0–17.0)
Immature Granulocytes: 1 %
Lymphocytes Relative: 8 %
Lymphs Abs: 0.8 10*3/uL (ref 0.7–4.0)
MCH: 28.9 pg (ref 26.0–34.0)
MCHC: 32.3 g/dL (ref 30.0–36.0)
MCV: 89.6 fL (ref 80.0–100.0)
Monocytes Absolute: 1 10*3/uL (ref 0.1–1.0)
Monocytes Relative: 10 %
Neutro Abs: 7.5 10*3/uL (ref 1.7–7.7)
Neutrophils Relative %: 81 %
Platelets: 250 10*3/uL (ref 150–400)
RBC: 4.22 MIL/uL (ref 4.22–5.81)
RDW: 13.2 % (ref 11.5–15.5)
WBC: 9.4 10*3/uL (ref 4.0–10.5)
nRBC: 0.4 % — ABNORMAL HIGH (ref 0.0–0.2)

## 2019-01-07 LAB — PROTIME-INR
INR: 1.2 (ref 0.8–1.2)
Prothrombin Time: 15 seconds (ref 11.4–15.2)

## 2019-01-07 LAB — COMPREHENSIVE METABOLIC PANEL
ALT: 41 U/L (ref 0–44)
AST: 39 U/L (ref 15–41)
Albumin: 3 g/dL — ABNORMAL LOW (ref 3.5–5.0)
Alkaline Phosphatase: 141 U/L — ABNORMAL HIGH (ref 38–126)
Anion gap: 14 (ref 5–15)
BUN: 35 mg/dL — ABNORMAL HIGH (ref 6–20)
CO2: 21 mmol/L — ABNORMAL LOW (ref 22–32)
Calcium: 8.5 mg/dL — ABNORMAL LOW (ref 8.9–10.3)
Chloride: 96 mmol/L — ABNORMAL LOW (ref 98–111)
Creatinine, Ser: 0.98 mg/dL (ref 0.61–1.24)
GFR calc Af Amer: >60 mL/min (ref >60)
GFR calc non Af Amer: >60 mL/min (ref >60)
Glucose, Bld: 121 mg/dL — ABNORMAL HIGH (ref 70–99)
Potassium: 4.6 mmol/L (ref 3.5–5.1)
Sodium: 131 mmol/L — ABNORMAL LOW (ref 135–145)
Total Bilirubin: 1.3 mg/dL — ABNORMAL HIGH (ref 0.3–1.2)
Total Protein: 7.1 g/dL (ref 6.5–8.1)

## 2019-01-07 LAB — LACTIC ACID, PLASMA: Lactic Acid, Venous: 1.5 mmol/L (ref 0.5–1.9)

## 2019-01-07 MED ORDER — HYDROMORPHONE HCL 1 MG/ML IJ SOLN
1.0000 mg | Freq: Once | INTRAMUSCULAR | Status: AC
  Administered 2019-01-07: 1 mg via INTRAVENOUS
  Filled 2019-01-07: qty 1

## 2019-01-07 NOTE — ED Triage Notes (Signed)
Patient with increased back pain for the last day.  He states that he had cervical neck and lumbar back fusions done by Dr Annette Stable on the 14th.  He is having increased pain today, now with fever.  Took Tylenol around 1500 today.

## 2019-01-07 NOTE — ED Provider Notes (Signed)
[facility]   CSN: [id number] Arrival date & time: 01/07/19  2047     History   Chief Complaint Chief Complaint  Patient presents with   Back Pain   Fever    HPI Chase Gonzalez is a 58 y.o. male.     The history is provided by the patient and medical records.  Back Pain Associated symptoms: fever   Fever    58 y.o. M with hx of degenerative spondylolisthesis s/p multi level lumbar decompression and fusions on 12/28/18 with Dr. Jordan LikesPool, presenting to the ED with fever and increased low back pain over the past 24 hours.  States initially post-op he was doing fine, walking around but sore as expected. Over the past 24 hours he has had acutely worsening pain, tingling in the legs (which he was experiencing prior to surgery), and fever.  Tmax at home 103F.  States wife has been caring for his incisions and expressing some clear fluid, today seemed to be a little more than normal and a slightly different color.    Past Medical History:  Diagnosis Date   Arthritis     Patient Active Problem List   Diagnosis Date Noted   Hospice care patient 12/29/2018   Palliative care patient 12/29/2018   Degenerative spondylolisthesis 12/28/2018   Trochanteric bursitis, left hip 01/15/2017    Past Surgical History:  Procedure Laterality Date   ANTERIOR CERVICAL DECOMP/DISCECTOMY FUSION  10/15/2017   Dr. Jordan LikesPool   JOINT REPLACEMENT     left TKR Left 06/2016   Dr. Charlann Boxerlin   posterior cervical disc fusion  02/26/2017   THORACIC DISC SURGERY     2017, 2 ruptured discs, had emergency surgery        Home Medications    Prior to Admission medications   Medication Sig Start Date End Date Taking? Authorizing Provider  cholecalciferol (VITAMIN D3) 25 MCG (1000 UT) tablet Take 1,000 Units by mouth daily.    [provider]  diazepam (VALIUM) 5 MG tablet Take 1-2 tablets (5-10 mg total) by mouth every 6 (six) hours as needed  for muscle spasms. 12/29/18   Julio SicksPool, Henry, MD  docusate sodium (COLACE) 100 MG capsule Take 200 mg by mouth daily.    [provider]  HYDROcodone-acetaminophen (NORCO) 10-325 MG tablet Take 1 tablet by mouth every 4 (four) hours as needed for moderate pain ((score 4 to 6)). 12/29/18   Julio SicksPool, Henry, MD  magnesium oxide (MAG-OX) 400 MG tablet Take 400 mg by mouth daily.    [provider]  meloxicam (MOBIC) 15 MG tablet Take 15 mg by mouth daily.    [provider]  Misc Natural Products (GLUCOSAMINE CHOND MSM FORMULA PO) Take 1 tablet by mouth daily.    [provider]  Multiple Vitamins-Minerals (MULTIVITAMIN WITH MINERALS) tablet Take 1 tablet by mouth daily.    [provider]  Omega-3 Fatty Acids (FISH OIL) 1000 MG CAPS Take 1,000 mg by mouth daily.    [provider]  oxymetazoline (AFRIN) 0.05 % nasal spray Place 1 spray into both nostrils 2 (two) times daily as needed for congestion.    [provider]    Family History History reviewed. No pertinent family history.  Social History Social History   Tobacco Use   Smoking status: Never Smoker   Smokeless tobacco: Current User    Types: Chew  Substance Use Topics   Alcohol use: Not Currently    Comment:  occasionally   Drug use: Not Currently     Allergies   Patient has no known allergies.   Review of Systems Review of Systems  Constitutional: Positive for fever.  Musculoskeletal: Positive for back pain.  All other systems reviewed and are negative.    Physical Exam Updated Vital Signs BP 112/81 (BP Location: Right Arm)    Pulse 90    Temp 100.1 F (37.8 C) (Oral)    Resp (!) 27    SpO2 96%   Physical Exam Vitals signs and nursing note reviewed.  Constitutional:      Appearance: He is well-developed.  HENT:     Head: Normocephalic and atraumatic.  Eyes:     Conjunctiva/sclera: Conjunctivae normal.     Pupils: Pupils are equal, round, and reactive  to light.  Neck:     Musculoskeletal: Normal range of motion.  Cardiovascular:     Rate and Rhythm: Normal rate and regular rhythm.     Heart sounds: Normal heart sounds.  Pulmonary:     Effort: Pulmonary effort is normal. No respiratory distress.     Breath sounds: Normal breath sounds. No stridor.  Abdominal:     General: Bowel sounds are normal.     Palpations: Abdomen is soft.     Tenderness: There is no abdominal tenderness.     Hernia: No hernia is present.  Musculoskeletal: Normal range of motion.     Comments: Lumbar incisions overall clear, steri strips remain in place, there is some serosanguinous drainage present Patient is able to [facility] around in bed but with great deal of observed pain, reports tingling down the legs to the toes, is able to move legs on commands, normal distal sensation and perfusion  Skin:    General: Skin is warm and dry.  Neurological:     Mental Status: He is alert and oriented to person, place, and time.      ED Treatments / Results  Labs (all labs ordered are listed, but only abnormal results are displayed) Labs Reviewed  COMPREHENSIVE METABOLIC PANEL - Abnormal; Notable for the following components:      Result Value   Sodium 131 (*)    Chloride 96 (*)    CO2 21 (*)    Glucose, Bld 121 (*)    BUN 35 (*)    Calcium 8.5 (*)    Albumin 3.0 (*)    Alkaline Phosphatase 141 (*)    Total Bilirubin 1.3 (*)    All other components within normal limits  CBC WITH DIFFERENTIAL/PLATELET - Abnormal; Notable for the following components:   Hemoglobin 12.2 (*)    HCT 37.8 (*)    nRBC 0.4 (*)    Abs Immature Granulocytes 0.12 (*)    All other components within normal limits  URINALYSIS, ROUTINE W REFLEX MICROSCOPIC - Abnormal; Notable for the following components:   Color, Urine AMBER (*)    Hgb urine dipstick MODERATE (*)    Protein, ur 100 (*)    Bacteria, UA RARE (*)    All other components within normal limits  CULTURE, BLOOD (ROUTINE X  2)  CULTURE, BLOOD (ROUTINE X 2)  SARS CORONAVIRUS 2 (HOSPITAL ORDER, PERFORMED IN [facility])  LACTIC ACID, PLASMA  PROTIME-INR    EKG None  Radiology Mr Lumbar Spine Wo Contrast  Result Date: 01/08/2019 CLINICAL DATA:  Recent lumbar spine surgery.  Fever and back pain. EXAM: MRI LUMBAR SPINE WITHOUT CONTRAST TECHNIQUE: Multiplanar, multisequence MR imaging of the  lumbar spine was performed. No intravenous contrast was administered. COMPARISON:  Lumbar spine MRI 11/30/2018 FINDINGS: Segmentation:  Normal Alignment: Grade 1 anterolisthesis at L5-S1 grade 1 retrolisthesis at L3-4. Vertebrae: Status post L4-S1 PLIF. Normal T1-weighted signal in bone marrow. There is a hemangioma at T11. Conus medullaris and cauda equina: Conus extends to the L1-2 level. Conus and cauda equina appear normal. Paraspinal and other soft tissues: Postoperative changes within the soft tissues at the L4-S1 levels. Multifocal punctate signal loss likely indicates small amount residual postoperative gas. Small dorsal midline fluid collection within the subcutaneous tissues. Disc levels: T12-L1: Normal. L1-2: Right eccentric disc bulge. No spinal canal stenosis. Mild right foraminal stenosis. L2-3: Disc space narrowing with small bulge. Moderate bilateral neural foraminal stenosis. L3-4: Moderate bilateral neural foraminal stenosis secondary to disc bulge, unchanged L4-5: Postoperative changes. Thecal sac is widely patent. Neural foramina are patent. L5-S1: Postsurgical changes. Widely patent thecal sac. Limited visualization of the neural foramina. Mild edema of the disc space. Visualization of the neural foramina is limited by the susceptibility effects from the spinal hardware. IMPRESSION: 1. Postsurgical changes of L4-5 PLIF with small subcutaneous dorsal fluid collection without specific features of infection. 2. Moderate foraminal stenosis at L2-3 and L3-4 unchanged. Electronically Signed   By: Deatra RobinsonKevin  Herman  M.D.   On: 01/08/2019 05:05   Dg Chest Portable 1 View  Result Date: 01/07/2019 CLINICAL DATA:  58 year old male with back pain. EXAM: PORTABLE CHEST 1 VIEW COMPARISON:  None. FINDINGS: The lungs are clear. There is no pleural effusion or pneumothorax. Top-normal cardiac silhouette. No acute osseous pathology. Lower cervical fixation hardware. Degenerative changes of the shoulders and widening of the right AC joint. IMPRESSION: No active disease. Electronically Signed   By: Elgie CollardArash  Radparvar M.D.   On: 01/07/2019 22:26    Procedures Procedures (including critical care time)  Medications Ordered in ED Medications  HYDROmorphone (DILAUDID) injection 1 mg (has no administration in time range)  HYDROmorphone (DILAUDID) injection 1 mg (1 mg Intravenous Given 01/07/19 2255)  HYDROmorphone (DILAUDID) injection 1 mg (1 mg Intravenous Given 01/08/19 0028)  diazepam (VALIUM) injection 5 mg (5 mg Intravenous Given 01/08/19 0028)  LORazepam (ATIVAN) injection 1 mg (1 mg Intravenous Given 01/08/19 0335)  HYDROmorphone (DILAUDID) injection 1 mg (1 mg Intravenous Given 01/08/19 0405)     Initial Impression / Assessment and Plan / ED Course  I have reviewed the triage vital signs and the nursing notes.  Pertinent labs & imaging results that were available during my care of the patient were reviewed by me and considered in my medical decision making (see chart for details).  58 year old male here with postop pain and fever.  He underwent extensive of multilevel, lumbar surgery on 12/28/18 with Dr. Jordan LikesPool.  States initially he was doing well, just sore but over the past few days pain has worsened and has started running fever.  T-max 103F.  He has low-grade fever here but is overall nontoxic.  His incision does have some serosanguineous drainage but no cellulitis or tissue crepitus noted.  Labs, blood and urine cultures have been sent.  We will plan for MRI with and without contrast.  Pain medications have been  ordered.  Initial lactate and white blood cell count are normal.  No significant electrolyte derangement.  11:23 PM Spoke with PA Megan with neurosurgery--- assisted on case in OR and familiar with patient.  Apparently, he has been calling all week due to increased pain and medications have been adjusted but never  mentioned fever.  Agreeable with MRI, will touch base afterwards with results and determine plan of care.  Patient refused to do contrasted MRI even after I had upfront discussion with this during initial assessment.  MRI tech has discussed this with radiology and they feel images are adequate and contrast is not necessarily needed.  He does have findings of small subcutaneous dorsal fluid collection without other findings concerning for infection.  Patient's pain has been extremely difficult to manage here, he remains significantly uncomfortable even after several doses of IV narcotics and valium.  I have touched base with neurosurgery again, they will admit for observation and pain control.  Patient was updated.  I called his wife, Anne Ng, to update her however no answer.  COVID screen pending.  Final Clinical Impressions(s) / ED Diagnoses   Final diagnoses:  Post-op pain    ED Discharge Orders    None       Larene Pickett, PA-C 01/08/19 [id number]    Noemi Chapel, MD 01/08/19 1351

## 2019-01-07 NOTE — ED Provider Notes (Signed)
The patient is a 58 year old male who presents after having back surgery recently.  He has had multiple surgical procedures on his back but with this when he is having increasing pain, has had fevers at home and is now having difficulty moving his legs.  On exam he is hyperreflexic at the knees, he is having difficulty lifting his legs, sensation is normal.  He will need imaging of the lower back as well as likely admission, consultation with neurosurgery.  I suspect a postsurgical infection or complication.  PA B[facility] has called NS.  Medical screening examination/treatment/procedure(s) were conducted as a shared visit with non-physician practitioner(s) and myself.  I personally evaluated the patient during the encounter.  Clinical Impression:   Final diagnoses:  Post-op pain         Chase Chapel, MD 01/08/19 1351

## 2019-01-07 NOTE — ED Notes (Signed)
[phone] wife please call. Veda Canning

## 2019-01-08 ENCOUNTER — Emergency Department (HOSPITAL_COMMUNITY): Payer: BC Managed Care – PPO

## 2019-01-08 DIAGNOSIS — G8918 Other acute postprocedural pain: Secondary | ICD-10-CM | POA: Diagnosis present

## 2019-01-08 LAB — BLOOD CULTURE ID PANEL (REFLEXED)

## 2019-01-08 LAB — URINALYSIS, ROUTINE W REFLEX MICROSCOPIC
Bilirubin Urine: NEGATIVE
Glucose, UA: NEGATIVE mg/dL
Ketones, ur: NEGATIVE mg/dL
Leukocytes,Ua: NEGATIVE
Nitrite: NEGATIVE
Protein, ur: 100 mg/dL — AB
Specific Gravity, Urine: 1.026 (ref 1.005–1.030)
pH: 5 (ref 5.0–8.0)

## 2019-01-08 LAB — SARS CORONAVIRUS 2 BY RT PCR (HOSPITAL ORDER, PERFORMED IN ~~LOC~~ HOSPITAL LAB): SARS Coronavirus 2: NEGATIVE

## 2019-01-08 LAB — SEDIMENTATION RATE: Sed Rate: 55 mm/hr — ABNORMAL HIGH (ref 0–16)

## 2019-01-08 LAB — C-REACTIVE PROTEIN: CRP: 26 mg/dL — ABNORMAL HIGH (ref <1.0)

## 2019-01-08 MED ORDER — ADULT MULTIVITAMIN W/MINERALS CH
1.0000 | ORAL_TABLET | Freq: Every day | ORAL | Status: DC
  Administered 2019-01-08 – 2019-01-12 (×5): 1 via ORAL
  Filled 2019-01-08 (×5): qty 1

## 2019-01-08 MED ORDER — MENTHOL 3 MG MT LOZG: 1.0000 | LOZENGE | OROMUCOSAL | Status: DC | PRN

## 2019-01-08 MED ORDER — PHENOL 1.4 % MT LIQD: 1.0000 | OROMUCOSAL | Status: DC | PRN

## 2019-01-08 MED ORDER — LORAZEPAM 2 MG/ML IJ SOLN
1.0000 mg | Freq: Once | INTRAMUSCULAR | Status: AC
  Administered 2019-01-08: 1 mg via INTRAVENOUS
  Filled 2019-01-08: qty 1

## 2019-01-08 MED ORDER — SODIUM CHLORIDE 0.9 % IV SOLN
2.0000 g | INTRAVENOUS | Status: DC
  Administered 2019-01-08 – 2019-01-11 (×4): 2 g via INTRAVENOUS
  Filled 2019-01-08 (×4): qty 20

## 2019-01-08 MED ORDER — ACETAMINOPHEN 325 MG PO TABS
650.0000 mg | ORAL_TABLET | ORAL | Status: DC | PRN
  Administered 2019-01-09 – 2019-01-10 (×2): 650 mg via ORAL
  Filled 2019-01-08 (×2): qty 2

## 2019-01-08 MED ORDER — ONDANSETRON HCL 4 MG PO TABS: 4.0000 mg | ORAL_TABLET | Freq: Four times a day (QID) | ORAL | Status: DC | PRN

## 2019-01-08 MED ORDER — SODIUM CHLORIDE 0.9% FLUSH: 3.0000 mL | INTRAVENOUS | Status: DC | PRN

## 2019-01-08 MED ORDER — OXYMETAZOLINE HCL 0.05 % NA SOLN
1.0000 | Freq: Two times a day (BID) | NASAL | Status: DC | PRN
  Filled 2019-01-08: qty 30

## 2019-01-08 MED ORDER — DIAZEPAM 5 MG PO TABS
5.0000 mg | ORAL_TABLET | Freq: Four times a day (QID) | ORAL | Status: DC | PRN
  Administered 2019-01-10: 5 mg via ORAL
  Filled 2019-01-08: qty 1

## 2019-01-08 MED ORDER — FLEET ENEMA 7-19 GM/118ML RE ENEM: 1.0000 | ENEMA | Freq: Once | RECTAL | Status: DC | PRN

## 2019-01-08 MED ORDER — MAGNESIUM OXIDE 400 (241.3 MG) MG PO TABS
400.0000 mg | ORAL_TABLET | Freq: Every day | ORAL | Status: DC
  Administered 2019-01-08 – 2019-01-12 (×5): 400 mg via ORAL
  Filled 2019-01-08 (×5): qty 1

## 2019-01-08 MED ORDER — ACETAMINOPHEN 650 MG RE SUPP: 650.0000 mg | RECTAL | Status: DC | PRN

## 2019-01-08 MED ORDER — HYDROCODONE-ACETAMINOPHEN 10-325 MG PO TABS
1.0000 | ORAL_TABLET | ORAL | Status: DC | PRN
  Administered 2019-01-11 – 2019-01-12 (×5): 1 via ORAL
  Filled 2019-01-08 (×5): qty 1

## 2019-01-08 MED ORDER — VITAMIN D 25 MCG (1000 UNIT) PO TABS
1000.0000 [IU] | ORAL_TABLET | Freq: Every day | ORAL | Status: DC
  Administered 2019-01-08 – 2019-01-12 (×5): 1000 [IU] via ORAL
  Filled 2019-01-08 (×5): qty 1

## 2019-01-08 MED ORDER — CEFAZOLIN SODIUM-DEXTROSE 2-4 GM/100ML-% IV SOLN
2.0000 g | Freq: Three times a day (TID) | INTRAVENOUS | Status: DC
  Administered 2019-01-08: 2 g via INTRAVENOUS
  Filled 2019-01-08: qty 100

## 2019-01-08 MED ORDER — POLYETHYLENE GLYCOL 3350 17 G PO PACK: 17.0000 g | PACK | Freq: Every day | ORAL | Status: DC | PRN

## 2019-01-08 MED ORDER — OXYCODONE HCL 5 MG PO TABS
10.0000 mg | ORAL_TABLET | ORAL | Status: DC | PRN
  Administered 2019-01-10: 10 mg via ORAL
  Filled 2019-01-08: qty 2

## 2019-01-08 MED ORDER — ONDANSETRON HCL 4 MG/2ML IJ SOLN: 4.0000 mg | Freq: Four times a day (QID) | INTRAMUSCULAR | Status: DC | PRN

## 2019-01-08 MED ORDER — SODIUM CHLORIDE 0.9 % IV SOLN
250.0000 mL | INTRAVENOUS | Status: DC
  Administered 2019-01-08 – 2019-01-10 (×2): 250 mL via INTRAVENOUS

## 2019-01-08 MED ORDER — HYDROMORPHONE HCL 1 MG/ML IJ SOLN
1.0000 mg | Freq: Once | INTRAMUSCULAR | Status: AC
  Administered 2019-01-08: 1 mg via INTRAVENOUS
  Filled 2019-01-08: qty 1

## 2019-01-08 MED ORDER — KETOROLAC TROMETHAMINE 30 MG/ML IJ SOLN
30.0000 mg | Freq: Four times a day (QID) | INTRAMUSCULAR | Status: DC
  Administered 2019-01-08 – 2019-01-12 (×17): 30 mg via INTRAVENOUS
  Filled 2019-01-08 (×17): qty 1

## 2019-01-08 MED ORDER — HYDROMORPHONE HCL 1 MG/ML IJ SOLN: 1.0000 mg | INTRAMUSCULAR | Status: AC | PRN

## 2019-01-08 MED ORDER — BISACODYL 10 MG RE SUPP: 10.0000 mg | Freq: Every day | RECTAL | Status: DC | PRN

## 2019-01-08 MED ORDER — SODIUM CHLORIDE 0.9% FLUSH
3.0000 mL | Freq: Two times a day (BID) | INTRAVENOUS | Status: DC
  Administered 2019-01-08 – 2019-01-12 (×8): 3 mL via INTRAVENOUS

## 2019-01-08 MED ORDER — DIAZEPAM 5 MG/ML IJ SOLN
5.0000 mg | Freq: Once | INTRAMUSCULAR | Status: AC
  Administered 2019-01-08: 5 mg via INTRAVENOUS
  Filled 2019-01-08: qty 2

## 2019-01-08 NOTE — Evaluation (Addendum)
Occupational Therapy Evaluation Patient Details Name: Chase Gonzalez MRN: [id number] DOB: [birth year] Today's Date: 01/08/2019    History of Present Illness Pt is a 58 y/o male s/p L4-5 L5-S1 PLIF on 7/14 returing with increased back pain and fever 7/25.   PMH includes arthritis, L TKA, and s/p ACDF.    Clinical Impression   Patient re-admitted after increased pain from back surgery (7/14), reports decreased functional mobility and ADL participation due to pain.  Patient limited by pain, decreased activity tolerance, weakness, and B LE edema.  Found supine in bed today after spilling urinal, and he currently requires max assist for bed mobility, min guard for UB ADLs, max-total assist for LB ADLs, total assist for toileting.  Unable to complete transfer today, attempting but pt unable to clear bottom from EOB and limited by burning pain. Requires increased time for all mobility today.  Reeducated throughout session on back precautions, brace mgmt and mobility. Believe he will progress well once pain is controlled, but believe intensive CIR level rehab maximize his return to independence with ADLs and mobility.  Will follow acutely.     Follow Up Recommendations  CIR;Supervision/Assistance - 24 hour    Equipment Recommendations  Other (comment)(TBD at next venue of care)    Recommendations for Other Services Rehab consult;PT consult     Precautions / Restrictions Precautions Precautions: Back Precaution Comments: verbally reviewed precautions with pt, recalled 1/3 without cueing Required Braces or Orthoses: Spinal Brace Spinal Brace: Lumbar corset;Applied in sitting position Restrictions Weight Bearing Restrictions: No      Mobility Bed Mobility Overal bed mobility: Needs Assistance Bed Mobility: Rolling;Sidelying to Sit;Sit to Sidelying Rolling: Min assist Sidelying to sit: Max assist;HOB elevated     Sit to sidelying: Max assist General bed mobility comments: increased time and  effort for all mobility, trunk and LB support to transition to/from EOB and min assist to guide LB when rolling   Transfers Overall transfer level: Needs assistance               General transfer comment: attempted to stand at EOB (elevated bed) but patient reports burning pain and unable to clear hips from EOB    Balance Overall balance assessment: Needs assistance Sitting-balance support: Bilateral upper extremity supported;Feet supported Sitting balance-Leahy Scale: Fair Sitting balance - Comments: relaint on B UE support today        Standing balance comment: unable                           ADL either performed or assessed with clinical judgement   ADL Overall ADL's : Needs assistance/impaired     Grooming: Min guard;Sitting   Upper Body Bathing: Min guard;Sitting   Lower Body Bathing: Moderate assistance;Sitting/lateral leans;Bed level   Upper Body Dressing : Min guard;Sitting   Lower Body Dressing: Total assistance;Sitting/lateral leans;Bed level     Toilet Transfer Details (indicate cue type and reason): unable Toileting- Clothing Manipulation and Hygiene: Total assistance;Sitting/lateral lean;Bed level Toileting - Clothing Manipulation Details (indicate cue type and reason): soiled after spilling urinal in bed, total assist for hygiene      Functional mobility during ADLs: Moderate assistance;Maximal assistance(limited to EOB) General ADL Comments: highly limited by pain, B LE edema      Vision         Perception     Praxis      Pertinent Vitals/Pain Pain Assessment: Faces Faces Pain Scale: Hurts worst Pain Location:  back Pain Descriptors / Indicators: Operative site guarding;Burning Pain Intervention(s): Monitored during session;Repositioned;Limited activity within patient's tolerance     Hand Dominance Right   Extremity/Trunk Assessment Upper Extremity Assessment Upper Extremity Assessment: Generalized weakness   Lower  Extremity Assessment Lower Extremity Assessment: Defer to PT evaluation   Cervical / Trunk Assessment Cervical / Trunk Assessment: Other exceptions Cervical / Trunk Exceptions: s/p PLIF    Communication Communication Communication: No difficulties   Cognition Arousal/Alertness: Awake/alert Behavior During Therapy: Anxious Overall Cognitive Status: Within Functional Limits for tasks assessed                                 General Comments: internally distracted by pain   General Comments  patient with B LE edema, appears to have open blister on top of R foot and blister on top of L foot --RN aware, encouraged foot pumps    Exercises     Shoulder Instructions      Home Living Family/patient expects to be discharged to:: Private residence Living Arrangements: Spouse/significant other Available Help at Discharge: Family;Available 24 hours/day Type of Home: House Home Access: Stairs to enter Entergy CorporationEntrance Stairs-Number of Steps: 1   Home Layout: One level     Bathroom Shower/Tub: Producer, television/film/videoWalk-in shower   Bathroom Toilet: Handicapped height     Home Equipment: Environmental consultantWalker - 2 wheels;Cane - single point;Grab bars - tub/shower;Adaptive equipment Adaptive Equipment: Reacher;Sock aid        Prior Functioning/Environment Level of Independence: Independent with assistive device(s)        Comments: Was using cane for ambulation prior to back surgery, since surgery progressively worsening pain and unable to transfer         OT Problem List: Decreased strength;Decreased activity tolerance;Impaired balance (sitting and/or standing);Decreased knowledge of use of DME or AE;Decreased knowledge of precautions;Pain;Increased edema      OT Treatment/Interventions: Self-care/ADL training;DME and/or AE instruction;Therapeutic activities;Patient/family education;Balance training    OT Goals(Current goals can be found in the care plan section) Acute Rehab OT Goals Patient Stated Goal:  to have less pain  OT Goal Formulation: With patient Time For Goal Achievement: 01/22/19 Potential to Achieve Goals: Good  OT Frequency: Min 2X/week   Barriers to D/C:            Co-evaluation              AM-PAC OT "6 Clicks" Daily Activity     Outcome Measure Help from another person eating meals?: None Help from another person taking care of personal grooming?: A Little Help from another person toileting, which includes using toliet, bedpan, or urinal?: Total Help from another person bathing (including washing, rinsing, drying)?: A Lot Help from another person to put on and taking off regular upper body clothing?: A Little Help from another person to put on and taking off regular lower body clothing?: Total 6 Click Score: 14   End of Session Equipment Utilized During Treatment: Back brace Nurse Communication: Mobility status;Other (comment)(pain and edema )  Activity Tolerance: Patient limited by pain Patient left: in bed;with call bell/phone within reach;with bed alarm set  OT Visit Diagnosis: Other abnormalities of gait and mobility (R26.89);Pain Pain - part of body: (back)                Time: 1240-1314 OT Time Calculation (min): 34 min Charges:  OT General Charges $OT Visit: 1 Visit OT Evaluation $OT Eval Moderate Complexity: 1  Mod OT Treatments $Self Care/Home Management : 8-22 mins  Delight Stare, OT Acute Rehabilitation Services Pager [phone] Office [phone]   Delight Stare 01/08/2019, 2:21 PM

## 2019-01-08 NOTE — Progress Notes (Signed)
Spoke with patient's wife and updated on POC.

## 2019-01-08 NOTE — ED Notes (Signed)
ED TO INPATIENT HANDOFF REPORT  ED Nurse Name and Phone #:  Sheela Stackesbeyde osorio RN [phone]  S Name/Age/Gender Chase Gonzalez 58 y.o. male Room/Bed: 018C/018C  Code Status   Code Status: Prior  Home/SNF/Other Home Patient oriented to: self, place, time and situation Is this baseline? Yes   Triage Complete: Triage complete  Chief Complaint Back Pain/Unable to sign  Triage Note Patient with increased back pain for the last day.  He states that he had cervical neck and lumbar back fusions done by Dr Jordan LikesPool on the 14th.  He is having increased pain today, now with fever.  Took Tylenol around 1500 today.     Allergies No Known Allergies  Level of Care/Admitting Diagnosis ED Disposition    ED Disposition Condition Comment   Admit  [facility] [[id number]]  Level of Care: Med-Surg [16]  Covid Evaluation: Asymptomatic Screening Protocol (No Symptoms)  Diagnosis: Post-operative pain [098119][309034]  Admitting Physician: Julio SicksPOOL, HENRY [phone][[id number]  Attending Physician: Julio SicksPOOL, HENRY [phone]]  PT Class (Do Not Modify): Observation [104]  PT Acc Code (Do Not Modify): Observation [10022]       B Medical/Surgery History Past Medical History:  Diagnosis Date  . Arthritis    Past Surgical History:  Procedure Laterality Date  . ANTERIOR CERVICAL DECOMP/DISCECTOMY FUSION  10/15/2017   Dr. Jordan LikesPool  . JOINT REPLACEMENT    . left TKR Left 06/2016   Dr. Charlann Boxerlin  . posterior cervical disc fusion  02/26/2017  . THORACIC DISC SURGERY     2017, 2 ruptured discs, had emergency surgery     A IV Location/Drains/Wounds Patient Lines/Drains/Airways Status   Active Line/Drains/Airways    Name:   Placement date:   Placement time:   Site:   Days:   Peripheral IV 01/07/19 Right;Upper Forearm   01/07/19    2300    Forearm   1   Incision (Closed) 12/28/18 Back   12/28/18    1317     11          Intake/Output Last 24 hours No intake or output data in the 24 hours ending 01/08/19  0600  Labs/Imaging Results for orders placed or performed during the hospital encounter of 01/07/19 (from the past 48 hour(s))  Comprehensive metabolic panel     Status: Abnormal   Collection Time: 01/07/19 10:07 PM  Result Value Ref Range   Sodium 131 (L) 135 - 145 mmol/L   Potassium 4.6 3.5 - 5.1 mmol/L   Chloride 96 (L) 98 - 111 mmol/L   CO2 21 (L) 22 - 32 mmol/L   Glucose, Bld 121 (H) 70 - 99 mg/dL   BUN 35 (H) 6 - 20 mg/dL   Creatinine, Ser 2.130.98 0.61 - 1.24 mg/dL   Calcium 8.5 (L) 8.9 - 10.3 mg/dL   Total Protein 7.1 6.5 - 8.1 g/dL   Albumin 3.0 (L) 3.5 - 5.0 g/dL   AST 39 15 - 41 U/L   ALT 41 0 - 44 U/L   Alkaline Phosphatase 141 (H) 38 - 126 U/L   Total Bilirubin 1.3 (H) 0.3 - 1.2 mg/dL   GFR calc non Af Amer >60 >60 mL/min   GFR calc Af Amer >60 >60 mL/min   Anion gap 14 5 - 15    Comment: Performed at [facility], [address] [place], [place] [address]  CBC with Differential     Status: Abnormal   Collection Time: 01/07/19 10:07 PM  Result Value  Ref Range   WBC 9.4 4.0 - 10.5 K/uL   RBC 4.22 4.22 - 5.81 MIL/uL   Hemoglobin 12.2 (L) 13.0 - 17.0 g/dL   HCT 16.137.8 (L) 09.639.0 - 04.552.0 %   MCV 89.6 80.0 - 100.0 fL   MCH 28.9 26.0 - 34.0 pg   MCHC 32.3 30.0 - 36.0 g/dL   RDW 40.913.2 81.111.5 - 91.415.5 %   Platelets 250 150 - 400 K/uL   nRBC 0.4 (H) 0.0 - 0.2 %   Neutrophils Relative % 81 %   Neutro Abs 7.5 1.7 - 7.7 K/uL   Lymphocytes Relative 8 %   Lymphs Abs 0.8 0.7 - 4.0 K/uL   Monocytes Relative 10 %   Monocytes Absolute 1.0 0.1 - 1.0 K/uL   Eosinophils Relative 0 %   Eosinophils Absolute 0.0 0.0 - 0.5 K/uL   Basophils Relative 0 %   Basophils Absolute 0.0 0.0 - 0.1 K/uL   Immature Granulocytes 1 %   Abs Immature Granulocytes 0.12 (H) 0.00 - 0.07 K/uL    Comment: Performed at [facility], [address] [place], [place] [address]  Protime-INR     Status: None   Collection Time: 01/07/19 10:07 PM  Result Value Ref Range   Prothrombin Time 15.0 11.4 -  15.2 seconds   INR 1.2 0.8 - 1.2    Comment: (NOTE) INR goal varies based on device and disease states. Performed at [facility], [address] [place], [place] [address]   Lactic acid, plasma     Status: None   Collection Time: 01/07/19 10:08 PM  Result Value Ref Range   Lactic Acid, Venous 1.5 0.5 - 1.9 mmol/L    Comment: Performed at [facility], [address] [place], [place] [address]  Urinalysis, Routine w reflex microscopic     Status: Abnormal   Collection Time: 01/07/19 11:56 PM  Result Value Ref Range   Color, Urine AMBER (A) YELLOW    Comment: BIOCHEMICALS MAY BE AFFECTED BY COLOR   APPearance CLEAR CLEAR   Specific Gravity, Urine 1.026 1.005 - 1.030   pH 5.0 5.0 - 8.0   Glucose, UA NEGATIVE NEGATIVE mg/dL   Hgb urine dipstick MODERATE (A) NEGATIVE   Bilirubin Urine NEGATIVE NEGATIVE   Ketones, ur NEGATIVE NEGATIVE mg/dL   Protein, ur 469100 (A) NEGATIVE mg/dL   Nitrite NEGATIVE NEGATIVE   Leukocytes,Ua NEGATIVE NEGATIVE   RBC / HPF 0-5 0 - 5 RBC/hpf   WBC, UA 0-5 0 - 5 WBC/hpf   Bacteria, UA RARE (A) NONE SEEN   Squamous Epithelial / LPF 0-5 0 - 5   Mucus PRESENT     Comment: Performed at [facility], [address] [place], [place] [address]   Mr Lumbar Spine Wo Contrast  Result Date: 01/08/2019 CLINICAL DATA:  Recent lumbar spine surgery.  Fever and back pain. EXAM: MRI LUMBAR SPINE WITHOUT CONTRAST TECHNIQUE: Multiplanar, multisequence MR imaging of the lumbar spine was performed. No intravenous contrast was administered. COMPARISON:  Lumbar spine MRI 11/30/2018 FINDINGS: Segmentation:  Normal Alignment: Grade 1 anterolisthesis at L5-S1 grade 1 retrolisthesis at L3-4. Vertebrae: Status post L4-S1 PLIF. Normal T1-weighted signal in bone marrow. There is a hemangioma at T11. Conus medullaris and cauda equina: Conus extends to the L1-2 level. Conus and cauda equina appear normal. Paraspinal and other soft tissues: Postoperative changes within the  soft tissues at the L4-S1 levels. Multifocal punctate signal loss likely indicates small amount  residual postoperative gas. Small dorsal midline fluid collection within the subcutaneous tissues. Disc levels: T12-L1: Normal. L1-2: Right eccentric disc bulge. No spinal canal stenosis. Mild right foraminal stenosis. L2-3: Disc space narrowing with small bulge. Moderate bilateral neural foraminal stenosis. L3-4: Moderate bilateral neural foraminal stenosis secondary to disc bulge, unchanged L4-5: Postoperative changes. Thecal sac is widely patent. Neural foramina are patent. L5-S1: Postsurgical changes. Widely patent thecal sac. Limited visualization of the neural foramina. Mild edema of the disc space. Visualization of the neural foramina is limited by the susceptibility effects from the spinal hardware. IMPRESSION: 1. Postsurgical changes of L4-5 PLIF with small subcutaneous dorsal fluid collection without specific features of infection. 2. Moderate foraminal stenosis at L2-3 and L3-4 unchanged. Electronically Signed   By: Ulyses Jarred M.D.   On: 01/08/2019 05:05   Dg Chest Portable 1 View  Result Date: 01/07/2019 CLINICAL DATA:  58 year old male with back pain. EXAM: PORTABLE CHEST 1 VIEW COMPARISON:  None. FINDINGS: The lungs are clear. There is no pleural effusion or pneumothorax. Top-normal cardiac silhouette. No acute osseous pathology. Lower cervical fixation hardware. Degenerative changes of the shoulders and widening of the right AC joint. IMPRESSION: No active disease. Electronically Signed   By: Anner Crete M.D.   On: 01/07/2019 22:26    Pending Labs Unresulted Labs (From admission, onward)    Start     Ordered   01/08/19 0535  SARS Coronavirus 2 (CEPHEID - Performed in [place] hospital lab), Hosp Order  (Asymptomatic Patients Labs)  Once,   STAT    Question:  Rule Out  Answer:  Yes   01/08/19 0534   01/07/19 2125  Culture, blood (Routine x 2)  BLOOD CULTURE X 2,   STAT     01/07/19  2125          Vitals/Pain Today's Vitals   01/08/19 0339 01/08/19 0457 01/08/19 0503 01/08/19 0545  BP:  135/83  124/80  Pulse: 100 92    Resp: 15 15    Temp:      TempSrc:      SpO2: 92% 94%    Weight:      Height:      PainSc:   5      Isolation Precautions No active isolations  Medications Medications  HYDROmorphone (DILAUDID) injection 1 mg (has no administration in time range)  HYDROmorphone (DILAUDID) injection 1 mg (1 mg Intravenous Given 01/07/19 2255)  HYDROmorphone (DILAUDID) injection 1 mg (1 mg Intravenous Given 01/08/19 0028)  diazepam (VALIUM) injection 5 mg (5 mg Intravenous Given 01/08/19 0028)  LORazepam (ATIVAN) injection 1 mg (1 mg Intravenous Given 01/08/19 0335)  HYDROmorphone (DILAUDID) injection 1 mg (1 mg Intravenous Given 01/08/19 0405)    Mobility walks Low fall risk   Focused Assessments Post Surgical    R Recommendations: See Admitting Provider Note  Report given to:   Additional Notes:

## 2019-01-08 NOTE — ED Notes (Signed)
Pt wife Annette [phone]

## 2019-01-08 NOTE — Progress Notes (Signed)
PHARMACY - PHYSICIAN COMMUNICATION CRITICAL VALUE ALERT - BLOOD CULTURE IDENTIFICATION (BCID)  Chase Gonzalez is an 58 y.o. male who presented to [facility] on 01/07/2019 with a chief complaint of back pain and fever.  Assessment: 57 YOM s/p recent lumbar decompression and fusion on 7/14 with increasing pain and weakness, fever and incisional drainage. Neurosurgery started on Cefazolin empirically. Now with 1 of 2 blood cultures growing GNR with BCID detecting E.coli.  Name of physician (or Provider) Contacted: Neurosurgery - Reinaldo Meeker, NP  Current antibiotics: Cefazolin 2g IV every 8 hours  Changes to prescribed antibiotics recommended:  D/c Cefazolin and transition to Rocephin 2g IV every 24 hours  Recommendations accepted by provider  Results for orders placed or performed during the hospital encounter of 01/07/19  Blood Culture ID Panel (Reflexed) (Collected: 01/07/2019 10:08 PM)  Result Value Ref Range   Enterococcus species NOT DETECTED NOT DETECTED   Listeria monocytogenes NOT DETECTED NOT DETECTED   Staphylococcus species NOT DETECTED NOT DETECTED   Staphylococcus aureus (BCID) NOT DETECTED NOT DETECTED   Streptococcus species NOT DETECTED NOT DETECTED   Streptococcus agalactiae NOT DETECTED NOT DETECTED   Streptococcus pneumoniae NOT DETECTED NOT DETECTED   Streptococcus pyogenes NOT DETECTED NOT DETECTED   Acinetobacter baumannii NOT DETECTED NOT DETECTED   Enterobacteriaceae species DETECTED (A) NOT DETECTED   Enterobacter cloacae complex NOT DETECTED NOT DETECTED   Escherichia coli DETECTED (A) NOT DETECTED   Klebsiella oxytoca NOT DETECTED NOT DETECTED   Klebsiella pneumoniae NOT DETECTED NOT DETECTED   Proteus species NOT DETECTED NOT DETECTED   Serratia marcescens NOT DETECTED NOT DETECTED   Carbapenem resistance NOT DETECTED NOT DETECTED   Haemophilus influenzae NOT DETECTED NOT DETECTED   Neisseria meningitidis NOT DETECTED NOT DETECTED   Pseudomonas aeruginosa NOT  DETECTED NOT DETECTED   Candida albicans NOT DETECTED NOT DETECTED   Candida glabrata NOT DETECTED NOT DETECTED   Candida krusei NOT DETECTED NOT DETECTED   Candida parapsilosis NOT DETECTED NOT DETECTED   Candida tropicalis NOT DETECTED NOT DETECTED    Thank you for allowing pharmacy to be a part of this patient's care.  Alycia Rossetti, PharmD, BCPS Clinical Pharmacist Clinical phone for 01/08/2019: [phone] 01/08/2019 2:00 PM   **Pharmacist phone directory can now be found on [email or web address] ([email or web address]).  Listed under [place].

## 2019-01-08 NOTE — H&P (Signed)
Chase Gonzalez is an 58 y.o. male.   Chief Complaint: Back pain and fever HPI: Chase Gonzalez underwent a two level lumbar decompression and fusion on 12/28/2018 by Dr. Annette Stable. He did well post-operatively and was discharged on 12/29/2018. He developed increasing pain with associated weakness that significantly worsened on 01/06/2019. He developed a fever and incisional drainage on 01/07/2019 and presented to the ED. He describes the drainage as serosanguinous. He reports his t-max at home was 103 degrees F. He complains of significant incisional pain at his lower back. He denies bowel or bladder dysfunction.  Past Medical History:  Diagnosis Date  . Arthritis     Past Surgical History:  Procedure Laterality Date  . ANTERIOR CERVICAL DECOMP/DISCECTOMY FUSION  10/15/2017   Dr. Annette Stable  . JOINT REPLACEMENT    . left TKR Left 06/2016   Dr. Alvan Dame  . posterior cervical disc fusion  02/26/2017  . THORACIC [place] SURGERY     2017, 2 ruptured discs, had emergency surgery    History reviewed. No pertinent family history. Social History:  reports that he has never smoked. His smokeless tobacco use includes chew. He reports previous alcohol use. He reports previous drug use.  Allergies: No Known Allergies  Medications Prior to Admission  Medication Sig Dispense Refill  . cholecalciferol (VITAMIN D3) 25 MCG (1000 UT) tablet Take 1,000 Units by mouth daily.    . diazepam (VALIUM) 5 MG tablet Take 1-2 tablets (5-10 mg total) by mouth every 6 (six) hours as needed for muscle spasms. 30 tablet 0  . docusate sodium (COLACE) 100 MG capsule Take 200 mg by mouth daily.    Marland Kitchen HYDROcodone-acetaminophen (NORCO) 10-325 MG tablet Take 1 tablet by mouth every 4 (four) hours as needed for moderate pain ((score 4 to 6)). 50 tablet 0  . magnesium oxide (MAG-OX) 400 MG tablet Take 400 mg by mouth daily.    . meloxicam (MOBIC) 15 MG tablet Take 15 mg by mouth daily.    . Misc Natural Products (GLUCOSAMINE CHOND MSM FORMULA PO)  Take 1 tablet by mouth daily.    . Multiple Vitamins-Minerals (MULTIVITAMIN WITH MINERALS) tablet Take 1 tablet by mouth daily.    . Omega-3 Fatty Acids (FISH OIL) 1000 MG CAPS Take 1,000 mg by mouth daily.    Marland Kitchen oxymetazoline (AFRIN) 0.05 % nasal spray Place 1 spray into both nostrils 2 (two) times daily as needed for congestion.      Results for orders placed or performed during the hospital encounter of 01/07/19 (from the past 48 hour(s))  Comprehensive metabolic panel     Status: Abnormal   Collection Time: 01/07/19 10:07 PM  Result Value Ref Range   Sodium 131 (L) 135 - 145 mmol/L   Potassium 4.6 3.5 - 5.1 mmol/L   Chloride 96 (L) 98 - 111 mmol/L   CO2 21 (L) 22 - 32 mmol/L   Glucose, Bld 121 (H) 70 - 99 mg/dL   BUN 35 (H) 6 - 20 mg/dL   Creatinine, Ser 0.98 0.61 - 1.24 mg/dL   Calcium 8.5 (L) 8.9 - 10.3 mg/dL   Total Protein 7.1 6.5 - 8.1 g/dL   Albumin 3.0 (L) 3.5 - 5.0 g/dL   AST 39 15 - 41 U/L   ALT 41 0 - 44 U/L   Alkaline Phosphatase 141 (H) 38 - 126 U/L   Total Bilirubin 1.3 (H) 0.3 - 1.2 mg/dL   GFR calc non Af Amer >60 >60 mL/min   GFR calc  Af Amer >60 >60 mL/min   Anion gap 14 5 - 15    Comment: Performed at [facility] [address], [place], [place] [address]  CBC with Differential     Status: Abnormal   Collection Time: 01/07/19 10:07 PM  Result Value Ref Range   WBC 9.4 4.0 - 10.5 K/uL   RBC 4.22 4.22 - 5.81 MIL/uL   Hemoglobin 12.2 (L) 13.0 - 17.0 g/dL   HCT 37.8 (L) 39.0 - 52.0 %   MCV 89.6 80.0 - 100.0 fL   MCH 28.9 26.0 - 34.0 pg   MCHC 32.3 30.0 - 36.0 g/dL   RDW 13.2 11.5 - 15.5 %   Platelets 250 150 - 400 K/uL   nRBC 0.4 (H) 0.0 - 0.2 %   Neutrophils Relative % 81 %   Neutro Abs 7.5 1.7 - 7.7 K/uL   Lymphocytes Relative 8 %   Lymphs Abs 0.8 0.7 - 4.0 K/uL   Monocytes Relative 10 %   Monocytes Absolute 1.0 0.1 - 1.0 K/uL   Eosinophils Relative 0 %   Eosinophils Absolute 0.0 0.0 - 0.5 K/uL   Basophils Relative 0 %   Basophils Absolute  0.0 0.0 - 0.1 K/uL   Immature Granulocytes 1 %   Abs Immature Granulocytes 0.12 (H) 0.00 - 0.07 K/uL    Comment: Performed at [facility] [address], [place], [place] [address]  Protime-INR     Status: None   Collection Time: 01/07/19 10:07 PM  Result Value Ref Range   Prothrombin Time 15.0 11.4 - 15.2 seconds   INR 1.2 0.8 - 1.2    Comment: (NOTE) INR goal varies based on device and disease states. Performed at [facility], [place] [address], [place], [place] [address]   Lactic acid, plasma     Status: None   Collection Time: 01/07/19 10:08 PM  Result Value Ref Range   Lactic Acid, Venous 1.5 0.5 - 1.9 mmol/L    Comment: Performed at [facility] [address], [place], [place] [address]  Culture, blood (Routine x 2)     Status: None (Preliminary result)   Collection Time: 01/07/19 10:08 PM   Specimen: BLOOD  Result Value Ref Range   Specimen Description BLOOD LEFT ARM    Special Requests      BOTTLES DRAWN AEROBIC AND ANAEROBIC Blood Culture results may not be optimal due to an excessive volume of blood received in culture bottles   Culture      NO GROWTH < 12 HOURS Performed at [facility] [address], [place], [place] [address]    Report Status PENDING   Culture, blood (Routine x 2)     Status: None (Preliminary result)   Collection Time: 01/07/19 10:08 PM   Specimen: BLOOD  Result Value Ref Range   Specimen Description BLOOD LEFT ARM    Special Requests      BOTTLES DRAWN AEROBIC ONLY Blood Culture adequate volume   Culture      NO GROWTH < 12 HOURS Performed at [facility], [place] [address], [place], [place] [address]    Report Status PENDING   Urinalysis, Routine w reflex microscopic     Status: Abnormal   Collection Time: 01/07/19 11:56 PM  Result Value Ref Range   Color, Urine AMBER (A) YELLOW    Comment: BIOCHEMICALS MAY BE AFFECTED BY COLOR   APPearance CLEAR CLEAR   Specific Gravity,  Urine 1.026 1.005 - 1.030   pH  5.0 5.0 - 8.0   Glucose, UA NEGATIVE NEGATIVE mg/dL   Hgb urine dipstick MODERATE (A) NEGATIVE   Bilirubin Urine NEGATIVE NEGATIVE   Ketones, ur NEGATIVE NEGATIVE mg/dL   Protein, ur 100 (A) NEGATIVE mg/dL   Nitrite NEGATIVE NEGATIVE   Leukocytes,Ua NEGATIVE NEGATIVE   RBC / HPF 0-5 0 - 5 RBC/hpf   WBC, UA 0-5 0 - 5 WBC/hpf   Bacteria, UA RARE (A) NONE SEEN   Squamous Epithelial / LPF 0-5 0 - 5   Mucus PRESENT     Comment: Performed at [facility], [place] [address] [place], [place] [address]  SARS Coronavirus 2 (CEPHEID - Performed in [facility] hospital lab), Hosp Order     Status: None   Collection Time: 01/08/19  5:40 AM   Specimen: Nasopharyngeal Swab  Result Value Ref Range   SARS Coronavirus 2 NEGATIVE NEGATIVE    Comment: (NOTE) If result is NEGATIVE SARS-CoV-2 target nucleic acids are NOT DETECTED. The SARS-CoV-2 RNA is generally detectable in upper and lower  respiratory specimens during the acute phase of infection. The lowest  concentration of SARS-CoV-2 viral copies this assay can detect is 250  copies / mL. A negative result does not preclude SARS-CoV-2 infection  and should not be used as the sole basis for treatment or other  patient management decisions.  A negative result may occur with  improper specimen collection / handling, submission of specimen other  than nasopharyngeal swab, presence of viral mutation(s) within the  areas targeted by this assay, and inadequate number of viral copies  (<250 copies / mL). A negative result must be combined with clinical  observations, patient history, and epidemiological information. If result is POSITIVE SARS-CoV-2 target nucleic acids are DETECTED. The SARS-CoV-2 RNA is generally detectable in upper and lower  respiratory specimens dur ing the acute phase of infection.  Positive  results are indicative of active infection with SARS-CoV-2.  Clinical  correlation with patient history and other diagnostic  information is  necessary to determine patient infection status.  Positive results do  not rule out bacterial infection or co-infection with other viruses. If result is PRESUMPTIVE POSTIVE SARS-CoV-2 nucleic acids MAY BE PRESENT.   A presumptive positive result was obtained on the submitted specimen  and confirmed on repeat testing.  While 2019 novel coronavirus  (SARS-CoV-2) nucleic acids may be present in the submitted sample  additional confirmatory testing may be necessary for epidemiological  and / or clinical management purposes  to differentiate between  SARS-CoV-2 and other Sarbecovirus currently known to infect humans.  If clinically indicated additional testing with an alternate test  methodology [id number]) is advised. The SARS-CoV-2 RNA is generally  detectable in upper and lower respiratory sp ecimens during the acute  phase of infection. The expected result is Negative. Fact Sheet for Patients:  StrictlyIdeas.no Fact Sheet for Healthcare Providers: BankingDealers.co.za This test is not yet approved or cleared by the [place] FDA and has been authorized for detection and/or diagnosis of SARS-CoV-2 by FDA under an Emergency Use Authorization (EUA).  This EUA will remain in effect (meaning this test can be used) for the duration of the COVID-19 declaration under Section 564(b)(1) of the Act, 21 U.S.C. section 360bbb-3(b)(1), unless the authorization is terminated or revoked sooner. Performed at [facility], [place] [address] [place], [place] [address]    Mr Lumbar Spine Wo Contrast  Result Date: 01/08/2019 CLINICAL  DATA:  Recent lumbar spine surgery.  Fever and back pain. EXAM: MRI LUMBAR SPINE WITHOUT CONTRAST TECHNIQUE: Multiplanar, multisequence MR imaging of the lumbar spine was performed. No intravenous contrast was administered. COMPARISON:  Lumbar spine MRI 11/30/2018 FINDINGS: Segmentation:  Normal Alignment:  Grade 1 anterolisthesis at L5-S1 grade 1 retrolisthesis at L3-4. Vertebrae: Status post L4-S1 PLIF. Normal T1-weighted signal in bone marrow. There is a hemangioma at T11. Conus medullaris and cauda equina: Conus extends to the L1-2 level. Conus and cauda equina appear normal. Paraspinal and other soft tissues: Postoperative changes within the soft tissues at the L4-S1 levels. Multifocal punctate signal loss likely indicates small amount residual postoperative gas. Small dorsal midline fluid collection within the subcutaneous tissues. Disc levels: T12-L1: Normal. L1-2: Right eccentric disc bulge. No spinal canal stenosis. Mild right foraminal stenosis. L2-3: Disc space narrowing with small bulge. Moderate bilateral neural foraminal stenosis. L3-4: Moderate bilateral neural foraminal stenosis secondary to disc bulge, unchanged L4-5: Postoperative changes. Thecal sac is widely patent. Neural foramina are patent. L5-S1: Postsurgical changes. Widely patent thecal sac. Limited visualization of the neural foramina. Mild edema of the disc space. Visualization of the neural foramina is limited by the susceptibility effects from the spinal hardware. IMPRESSION: 1. Postsurgical changes of L4-5 PLIF with small subcutaneous dorsal fluid collection without specific features of infection. 2. Moderate foraminal stenosis at L2-3 and L3-4 unchanged. Electronically Signed   By: Ulyses Jarred M.D.   On: 01/08/2019 05:05   Dg Chest Portable 1 View  Result Date: 01/07/2019 CLINICAL DATA:  58 year old male with back pain. EXAM: PORTABLE CHEST 1 VIEW COMPARISON:  None. FINDINGS: The lungs are clear. There is no pleural effusion or pneumothorax. Top-normal cardiac silhouette. No acute osseous pathology. Lower cervical fixation hardware. Degenerative changes of the shoulders and widening of the right AC joint. IMPRESSION: No active disease. Electronically Signed   By: Anner Crete M.D.   On: 01/07/2019 22:26    Review of Systems   Constitutional: Positive for fever and malaise/fatigue. Negative for chills, diaphoresis and weight loss.  HENT: Negative.   Eyes: Negative.   Respiratory: Negative.   Cardiovascular: Negative.   Gastrointestinal: Negative.   Genitourinary: Negative for dysuria, flank pain, frequency, hematuria and urgency.  Musculoskeletal: Positive for back pain and myalgias. Negative for falls, joint pain and neck pain.  Skin: Negative for itching and rash.  Neurological: Positive for tingling and weakness. Negative for tremors, sensory change, seizures and loss of consciousness.  Endo/Heme/Allergies: Negative.   Psychiatric/Behavioral: Negative.     Blood pressure 112/74, pulse (!) 103, temperature 99.9 F (37.7 C), temperature source Oral, resp. rate 15, height 5' 11" (1.803 m), weight 96.2 kg, SpO2 92 %. Physical Exam  Constitutional: He is oriented to person, place, and time. He appears well-developed and well-nourished. He appears distressed.  HENT:  Head: Normocephalic and atraumatic.  Eyes: Pupils are equal, round, and reactive to light. Conjunctivae and EOM are normal.  Neck: Normal range of motion. Neck supple.  Cardiovascular: Regular rhythm. Tachycardia present.  Respiratory: Effort normal and breath sounds normal.  GI: Soft. He exhibits no distension.  Genitourinary:    Penis normal.   Musculoskeletal: Normal range of motion.     Lumbar back: He exhibits pain.       Back:  Neurological: He is alert and oriented to person, place, and time.  Skin: Skin is warm and dry.  Psychiatric: He has a normal mood and affect.     Assessment/Plan Mr. Bushnell is 73  days s/p 2 level lumbar decompression and fusion. His incision has serosanguinous drainage, without purulence or odor. His WBC is 9.4 and blood cultures have not grown anything so far. Ancef 2g q8 hours IV ordered prophylactically. Toradol has been ordered in addition to his prn pain medications. Hopefully, this will bring his pain  down to more tolerable level. ESR and CRP ordered, awaiting results. Will continue to monitor Mr. Ager condition over the next 24 hours.  [name], NP 01/08/2019, 11:38 AM

## 2019-01-08 NOTE — ED Notes (Signed)
Patient transported to MRI 

## 2019-01-08 NOTE — Progress Notes (Signed)
Spoke with patient's wife, Anne Ng. Explained plan of care. She has concerns regarding her ability to assist him at home. Explained that PT and OT will work with patient and make recommendations regarding further therapy. I answered all of her questions.

## 2019-01-09 DIAGNOSIS — Z20828 Contact with and (suspected) exposure to other viral communicable diseases: Secondary | ICD-10-CM | POA: Diagnosis present

## 2019-01-09 DIAGNOSIS — G8918 Other acute postprocedural pain: Secondary | ICD-10-CM | POA: Diagnosis present

## 2019-01-09 DIAGNOSIS — Z981 Arthrodesis status: Secondary | ICD-10-CM | POA: Diagnosis not present

## 2019-01-09 DIAGNOSIS — M1990 Unspecified osteoarthritis, unspecified site: Secondary | ICD-10-CM | POA: Diagnosis present

## 2019-01-09 DIAGNOSIS — M549 Dorsalgia, unspecified: Secondary | ICD-10-CM | POA: Diagnosis present

## 2019-01-09 DIAGNOSIS — R509 Fever, unspecified: Secondary | ICD-10-CM | POA: Diagnosis present

## 2019-01-09 DIAGNOSIS — Z96651 Presence of right artificial knee joint: Secondary | ICD-10-CM | POA: Diagnosis present

## 2019-01-09 DIAGNOSIS — Z720 Tobacco use: Secondary | ICD-10-CM | POA: Diagnosis not present

## 2019-01-09 LAB — CBC
HCT: 34.3 % — ABNORMAL LOW (ref 39.0–52.0)
Hemoglobin: 10.9 g/dL — ABNORMAL LOW (ref 13.0–17.0)
MCH: 28.5 pg (ref 26.0–34.0)
MCHC: 31.8 g/dL (ref 30.0–36.0)
MCV: 89.6 fL (ref 80.0–100.0)
Platelets: 213 10*3/uL (ref 150–400)
RBC: 3.83 MIL/uL — ABNORMAL LOW (ref 4.22–5.81)
RDW: 13.3 % (ref 11.5–15.5)
WBC: 10.2 10*3/uL (ref 4.0–10.5)
nRBC: 0 % (ref 0.0–0.2)

## 2019-01-09 NOTE — Plan of Care (Signed)

## 2019-01-09 NOTE — Evaluation (Signed)
Physical Therapy Evaluation Patient Details Name: Chase Gonzalez MRN: [id number] DOB: Dec 20, 1960 Today's Date: 01/09/2019   History of Present Illness  Pt is a 58 y/o male s/p L4-5 L5-S1 PLIF on 7/14 returing with increased back pain and fever 7/25.   PMH includes arthritis, L TKA, and s/p ACDF.   Clinical Impression  Pt admitted with above diagnosis. Pt currently with functional limitations due to the deficits listed below (see PT Problem List). Pt with decreased pain today on eval compared to yesterday with OT. He presents with BLE weakness that is significantly affecting gait and balance. Mild knee buckling with gait and heavy reliance on RW as well as narrow BOS with near scissoring.  Pt will benefit from skilled PT to increase their independence and safety with mobility to allow discharge to the venue listed below.       Follow Up Recommendations CIR    Equipment Recommendations  None recommended by PT    Recommendations for Other Services Rehab consult     Precautions / Restrictions Precautions Precautions: Back Precaution Booklet Issued: No Precaution Comments: verbally reviewed precautions with pt, needed reminder not to bend in sitting Required Braces or Orthoses: Spinal Brace Spinal Brace: Lumbar corset;Applied in sitting position Restrictions Weight Bearing Restrictions: No      Mobility  Bed Mobility Overal bed mobility: Needs Assistance Bed Mobility: Rolling;Sidelying to Sit Rolling: Min assist Sidelying to sit: Mod assist       General bed mobility comments: pt able to bridge knees with increased time but lacks sufficient hip strength to hold them in that position, especially when he begins to roll. Min A to maintain position and cue log rolling. Mod A for trunk elevation into sitting. Pt able to reposition self EOB with increased time and effort  Transfers Overall transfer level: Needs assistance Equipment used: Rolling walker (2 wheeled) Transfers: Sit  to/from Stand Sit to Stand: Min assist;+2 safety/equipment         General transfer comment: pt placed both hands on RW for pain control, RW stabilized by therapist. Min A for power up, +2 for safety  Ambulation/Gait Ambulation/Gait assistance: Min assist;+2 safety/equipment Gait Distance (Feet): 45 Feet Assistive device: Rolling walker (2 wheeled) Gait Pattern/deviations: Step-through pattern;Narrow base of support;Trunk flexed Gait velocity: decreased Gait velocity interpretation: <1.31 ft/sec, indicative of household ambulator General Gait Details: pt nearly scissoring, feet often touching each other. Able to correct somewhat with vc's. Min A for balance and vc's for erect posture but pt with heavy reliance on UE with fwd flexed trunk. Mild B knee instability noted, worsened with distance  Stairs            Wheelchair Mobility    Modified Rankin (Stroke Patients Only)       Balance Overall balance assessment: Needs assistance Sitting-balance support: No upper extremity supported;Feet supported Sitting balance-Leahy Scale: Fair     Standing balance support: Bilateral upper extremity supported;During functional activity Standing balance-Leahy Scale: Poor Standing balance comment: reliant on UE support                             Pertinent Vitals/Pain Pain Assessment: 0-10 Pain Score: 3  Pain Location: back Pain Descriptors / Indicators: Operative site guarding;Burning Pain Intervention(s): Limited activity within patient's tolerance;Monitored during session    Home Living Family/patient expects to be discharged to:: Private residence Living Arrangements: Spouse/significant other Available Help at Discharge: Family;Available 24 hours/day Type of Home: House  Home Access: Stairs to enter   Entrance Stairs-Number of Steps: 1 Home Layout: One level Home Equipment: Walker - 2 wheels;Cane - single point;Grab bars - tub/shower;Adaptive equipment       Prior Function Level of Independence: Independent with assistive device(s)         Comments: Was using cane for ambulation prior to back surgery, since surgery progressively worsening pain and unable to transfer or ambulate      Hand Dominance   Dominant Hand: Right    Extremity/Trunk Assessment   Upper Extremity Assessment Upper Extremity Assessment: Defer to OT evaluation    Lower Extremity Assessment Lower Extremity Assessment: Generalized weakness;RLE deficits/detail;LLE deficits/detail RLE Deficits / Details: proximal weakness noted B with poor hip and knee control. Pt does not have foot drop but does have low foot clearance with ambulation RLE Sensation: decreased proprioception RLE Coordination: decreased fine motor;decreased gross motor LLE Deficits / Details: see RLE note LLE Sensation: decreased proprioception LLE Coordination: decreased fine motor;decreased gross motor    Cervical / Trunk Assessment Cervical / Trunk Assessment: Other exceptions Cervical / Trunk Exceptions: s/p PLIF   Communication   Communication: No difficulties  Cognition Arousal/Alertness: Awake/alert Behavior During Therapy: WFL for tasks assessed/performed Overall Cognitive Status: Within Functional Limits for tasks assessed                                        General Comments      Exercises     Assessment/Plan    PT Assessment Patient needs continued PT services  PT Problem List Decreased strength;Decreased balance;Decreased mobility;Decreased knowledge of use of DME;Decreased knowledge of precautions;Pain       PT Treatment Interventions DME instruction;Gait training;Functional mobility training;Therapeutic activities;Therapeutic exercise;Balance training;Patient/family education;Neuromuscular re-education    PT Goals (Current goals can be found in the Care Plan section)  Acute Rehab PT Goals Patient Stated Goal: to have less pain  PT Goal Formulation:  With patient Time For Goal Achievement: 01/23/19 Potential to Achieve Goals: Good    Frequency Min 5X/week   Barriers to discharge        Co-evaluation               AM-PAC PT "6 Clicks" Mobility  Outcome Measure Help needed turning from your back to your side while in a flat bed without using bedrails?: A Little Help needed moving from lying on your back to sitting on the side of a flat bed without using bedrails?: A Little Help needed moving to and from a bed to a chair (including a wheelchair)?: A Lot Help needed standing up from a chair using your arms (e.g., wheelchair or bedside chair)?: A Lot Help needed to walk in hospital room?: A Lot Help needed climbing 3-5 steps with a railing? : A Lot 6 Click Score: 14    End of Session Equipment Utilized During Treatment: Gait belt;Back brace Activity Tolerance: Patient tolerated treatment well Patient left: in chair;with call bell/phone within reach;with chair alarm set Nurse Communication: Mobility status PT Visit Diagnosis: Unsteadiness on feet (R26.81);Muscle weakness (generalized) (M62.81);Pain Pain - part of body: (back)    Time: [id number] PT Time Calculation (min) (ACUTE ONLY): 23 min   Charges:   PT Evaluation $PT Eval Moderate Complexity: 1 Mod PT Treatments $Gait Training: 8-22 mins        Lyanne CoVictoria Sherion Dooly, PT  Acute Rehab Services  Pager [phone] Office [phone]  Chase Gonzalez 01/09/2019, 12:19 PM

## 2019-01-09 NOTE — Plan of Care (Signed)

## 2019-01-09 NOTE — Progress Notes (Signed)
Patient looks better today.  Pain much better controlled.  No lower extremity symptoms.  He has been afebrile.  He is awake and alert.  He is oriented and appropriate.  Motor and sensory function are intact.  His wound still has some serosanguineous drainage.  There is no erythema.  There is no evidence of deep wound collection or severe tenderness.  Patient yesterday had 1 blood culture come back with E. coli.  Sensitivities are pending.  Antibiotics have been changed to Rocephin.  Currently I do not see clear indications of wound infection although it certainly is a concern.  Patient did have bacteria in his urine but culture was not obtained in the emergency department.  At this point we will continue with Rocephin and observation.  Continue efforts at mobilization with therapy.  Change status to inpatient.

## 2019-01-10 LAB — CULTURE, BLOOD (ROUTINE X 2)

## 2019-01-10 NOTE — Progress Notes (Signed)
Rehab Admissions Coordinator Note:  Patient was screened by Cleatrice Burke for appropriateness for an Inpatient Acute Rehab Consult per PT and recs..  At this time, we are recommending Inpatient Rehab consult. Please place order for consult if pt would like to be considered for admit,  Cleatrice Burke RN MSN 01/10/2019, 7:59 AM  I can be reached at [phone].

## 2019-01-10 NOTE — Plan of Care (Signed)
  Problem: Education: Goal: Knowledge of General Education information will improve Description Including pain rating scale, medication(s)/side effects and non-pharmacologic comfort measures Outcome: Progressing   Problem: Health Behavior/Discharge Planning: Goal: Ability to manage health-related needs will improve Outcome: Progressing   

## 2019-01-10 NOTE — Progress Notes (Signed)
Overall patient feeling better.  Mobility slowly improving.  States leg pain has essentially completely resolved.  Continues with mild temperature elevation.  Vital signs are stable.  E. coli from blood culture is pansensitive.  He is awake and alert.  Oriented and appropriate.  Motor and sensory function are intact.  His wound continues to have some serosanguineous discharge from the inferior aspect.  There continues to be no evidence of erythema or severe tenderness.  Patient with likely E. coli sepsis of unclear etiology.  Patient did have bacteria in his urine but culture has been negative so far.  His wound is certainly a concern for source of infection but I would expect him to be sicker with worse symptoms than what he has right now.  Continue IV antibiotics and observation for now.  Deciding regarding further home discharge versus inpatient rehab.

## 2019-01-10 NOTE — Progress Notes (Signed)
Physical Therapy Treatment Patient Details Name: Chase Gonzalez MRN: [id number] DOB: 14-Mar-1961 Today's Date: 01/10/2019    History of Present Illness Pt is a 58 y/o male s/p L4-5 L5-S1 PLIF on 7/14 returing with increased back pain and fever 7/25.   PMH includes arthritis, L TKA, and s/p ACDF.     PT Comments    Pt continues with profound LE weakness R>L and poor neuromuscular control during functional activity. Ambulated 20' followed by seated rest break and then 40' with min A and RW and chair brought behind due to risk of knees buckling. Heavy reliance on UE support of RW to be able to ambulate. Pt agreeable to CIR if this is a possibility. PT will continue to follow.    Follow Up Recommendations  CIR     Equipment Recommendations  None recommended by PT    Recommendations for Other Services Rehab consult     Precautions / Restrictions Precautions Precautions: Back Precaution Booklet Issued: No Precaution Comments: pt did well keeping precautions with bed mobility but needed reminders about twisting when getting up from chair Required Braces or Orthoses: Spinal Brace Spinal Brace: Lumbar corset;Applied in sitting position Restrictions Weight Bearing Restrictions: No    Mobility  Bed Mobility Overal bed mobility: Needs Assistance Bed Mobility: Rolling;Sidelying to Sit Rolling: Min assist Sidelying to sit: Min assist       General bed mobility comments: min A for positioning to initiate rolling and maintain during roll and SL to sit  Transfers Overall transfer level: Needs assistance Equipment used: Rolling walker (2 wheeled) Transfers: Sit to/from Stand Sit to Stand: Min assist;+2 safety/equipment         General transfer comment: worked on pushing from bed and recliner with sit<>stand but pt required mod A from bed and needed min A from recliner during transition of hands from arm rest to RW due to LE weakness  Ambulation/Gait Ambulation/Gait assistance: Min  assist;+2 safety/equipment Gait Distance (Feet): 60 Feet(20', 40') Assistive device: Rolling walker (2 wheeled) Gait Pattern/deviations: Step-through pattern;Narrow base of support;Trunk flexed Gait velocity: decreased Gait velocity interpretation: <1.31 ft/sec, indicative of household ambulator General Gait Details: continued to have flexed trunk, can correct in static standing but not during gait due to weakness and need for heavy UE support. Narrow base but no scissoring today.    Stairs Stairs: (unable)           Wheelchair Mobility    Modified Rankin (Stroke Patients Only)       Balance Overall balance assessment: Needs assistance Sitting-balance support: No upper extremity supported;Feet supported Sitting balance-Leahy Scale: Good     Standing balance support: Bilateral upper extremity supported;During functional activity Standing balance-Leahy Scale: Poor Standing balance comment: reliant on UE support                            Cognition Arousal/Alertness: Awake/alert Behavior During Therapy: WFL for tasks assessed/performed Overall Cognitive Status: Within Functional Limits for tasks assessed                                        Exercises Total Joint Exercises Bridges: 5 reps;AAROM;Supine;Limitations Bridges Limitations: pt cannot initiate bridge and keep LE's in alignment, needed mod A to compete task of pelvic tilt and intiation of bridging General Exercises - Lower Extremity Ankle Circles/Pumps: AROM;Both;10 reps;Seated Long Arc Quad: AROM;Both;5  reps;Limitations;Seated Long [employer] Limitations: unable to fully extend knees due to weakness R>L Heel Raises: AROM;Both;10 reps;Seated    General Comments General comments (skin integrity, edema, etc.): BLE edema noted, L>R      Pertinent Vitals/Pain Pain Assessment: 0-10 Pain Score: 5  Pain Location: back Pain Descriptors / Indicators: Operative site  guarding;Burning;Sharp Pain Intervention(s): Limited activity within patient's tolerance;Monitored during session    Home Living                      Prior Function            PT Goals (current goals can now be found in the care plan section) Acute Rehab PT Goals Patient Stated Goal: regain strength PT Goal Formulation: With patient Time For Goal Achievement: 01/23/19 Potential to Achieve Goals: Good Progress towards PT goals: Progressing toward goals    Frequency    Min 5X/week      PT Plan Current plan remains appropriate    Co-evaluation              AM-PAC PT "6 Clicks" Mobility   Outcome Measure  Help needed turning from your back to your side while in a flat bed without using bedrails?: A Little Help needed moving from lying on your back to sitting on the side of a flat bed without using bedrails?: A Little Help needed moving to and from a bed to a chair (including a wheelchair)?: A Lot Help needed standing up from a chair using your arms (e.g., wheelchair or bedside chair)?: A Lot Help needed to walk in hospital room?: A Lot Help needed climbing 3-5 steps with a railing? : A Lot 6 Click Score: 14    End of Session Equipment Utilized During Treatment: Gait belt;Back brace Activity Tolerance: Patient tolerated treatment well Patient left: in chair;with call bell/phone within reach;with chair alarm set Nurse Communication: Mobility status PT Visit Diagnosis: Unsteadiness on feet (R26.81);Muscle weakness (generalized) (M62.81);Pain Pain - part of body: (back)     Time: 7408-1448 PT Time Calculation (min) (ACUTE ONLY): 28 min  Charges:  $Gait Training: 8-22 mins $Therapeutic Exercise: 8-22 mins                     Leighton Roach, [place]  Pager [phone] Office [place] 01/10/2019, 1:00 PM

## 2019-01-10 NOTE — Progress Notes (Signed)
Inpatient Rehabilitation Admissions Coordinator  Inpatient rehab consult received. I met with patient at bedside for assessment. He prefers D/C home with HH. I have contacted SW, Erin of his preference and we will sign off at this time.  Barbara Boyette, RN, MSN Rehab Admissions Coordinator [phone] 01/10/2019 4:17 PM  

## 2019-01-11 MED ORDER — ENSURE ENLIVE PO LIQD
237.0000 mL | Freq: Two times a day (BID) | ORAL | Status: DC
  Administered 2019-01-11 – 2019-01-12 (×4): 237 mL via ORAL

## 2019-01-11 NOTE — Progress Notes (Signed)
Inpatient Rehabilitation Admissions Coordinator  Patient has again changed his mind. Would prefer an inpt rehab admit if insurance approves. I will begin insurance authorization and follow up tomorrow with their decision.  Danne Baxter, RN, MSN Rehab Admissions Coordinator [phone] 01/11/2019 12:49 PM

## 2019-01-11 NOTE — Progress Notes (Signed)
Patient had moderate drainage, dressing reinforced with ABG pads and paper tape.

## 2019-01-11 NOTE — Progress Notes (Signed)
Patient overall stable.  Still has not been spiking any temperature elevations.  Pain overall improved but mobility still marginal for discharge home.  Wound still with some drainage with some evidence of purulence.  Still no erythema.  Still noted wound tenderness.  Motor and sensory function intact.  Chest and abdomen benign.  Patient with likely E. coli sepsis of unclear etiology urinary versus wound infection.  Continue IV antibiotics and observation.  Continue efforts at mobilization.  Possible discharge home tomorrow.

## 2019-01-11 NOTE — Progress Notes (Signed)
Occupational Therapy Treatment Patient Details Name: Chase Gonzalez MRN: [id number] DOB: 1960/11/16 Today's Date: 01/11/2019    History of present illness Pt is a 58 y/o male s/p L4-5 L5-S1 PLIF on 7/14 returing with increased back pain and fever 7/25.   PMH includes arthritis, L TKA, and s/p ACDF.    OT comments  Pt progressing toward stated goals from evaluation, continues to have post surgical pain, decreased strength and activity tolerance limiting ability to engage in BADL at desired level of ind. At start of session, pt able to recall precautions 3/3. Focused session on bathroom mobility and transfers. Pt completed toilet t/f with BSC over top at min A, cues needed for hand placement. Pt then stood at sink to complete grooming, needing to rest LUE on sink to bear weight. During standing, pt knee buckling and starting to bend at waist, needing consistent cues. He states he is experiencing pain and fatigue with this standing task. He was able to complete standing grooming task entirely with min A. Per pt- miscommunication happened surrounding CIR and he is agreeable. Continue to recommend CIR for intensive rehab for pt to return to previous functional baseline. He also voices concerns of his wife helping care for him at home in current condition. Will continue to follow per POC listed below.    Follow Up Recommendations  CIR;Supervision/Assistance - 24 hour    Equipment Recommendations  Other (comment)(defer to next venue)    Recommendations for Other Services      Precautions / Restrictions Precautions Precautions: Back Precaution Booklet Issued: No Precaution Comments: pt recalled 3/3 Required Braces or Orthoses: Spinal Brace Spinal Brace: Lumbar corset;Applied in sitting position Restrictions Weight Bearing Restrictions: No       Mobility Bed Mobility               General bed mobility comments: up in chair  Transfers Overall transfer level: Needs  assistance Equipment used: Rolling walker (2 wheeled) Transfers: Sit to/from Stand Sit to Stand: Min assist         General transfer comment: cues for hand placement, then with heavy use of arm rests to power to standing    Balance Overall balance assessment: Needs assistance Sitting-balance support: No upper extremity supported;Feet supported Sitting balance-Leahy Scale: Good     Standing balance support: Bilateral upper extremity supported;During functional activity Standing balance-Leahy Scale: Poor Standing balance comment: heavily reliant on UE support                           ADL either performed or assessed with clinical judgement   ADL Overall ADL's : Needs assistance/impaired Eating/Feeding: Set up;Sitting   Grooming: Minimal assistance;Standing Grooming Details (indicate cue type and reason): cues to not lean into sink; some knee buckling noted             Lower Body Dressing: Total assistance;Sitting/lateral leans;Sit to/from stand Lower Body Dressing Details (indicate cue type and reason): total A for socks, usually uses sock aide Toilet Transfer: Minimal assistance;BSC;RW Toilet Transfer Details (indicate cue type and reason): BSC over toilet, min A to power up; cues for hand placement Toileting- Clothing Manipulation and Hygiene: Minimal assistance;Sitting/lateral lean;Sit to/from stand;Adhering to back precautions       Functional mobility during ADLs: Minimal assistance;Rolling walker General ADL Comments: pt improving with ADL engagement this date from eval. Able to state precautions, but having issues not bending with knee buckling with standing BADLs  Vision Patient Visual Report: No change from baseline     Perception     Praxis      Cognition Arousal/Alertness: Awake/alert Behavior During Therapy: WFL for tasks assessed/performed Overall Cognitive Status: Within Functional Limits for tasks assessed                                           Exercises Exercises: General Lower Extremity General Exercises - Lower Extremity Ankle Circles/Pumps: AROM;Both;10 reps;Seated Long Arc Quad: AROM;Both;Limitations;Seated;10 reps Long [employer] Quad Limitations: unable to fully extend knees due to weakness R>L   Shoulder Instructions       General Comments ambulated in socks today instead of shoes due to edema which helped foot discomfort but did not help his already narrow stance    Pertinent Vitals/ Pain       Pain Assessment: Faces Faces Pain Scale: Hurts whole lot Pain Location: back Pain Descriptors / Indicators: Operative site guarding;Sharp;Aching Pain Intervention(s): Limited activity within patient's tolerance;Monitored during session;Repositioned  Home Living                                          Prior Functioning/Environment              Frequency  Min 3X/week        Progress Toward Goals  OT Goals(current goals can now be found in the care plan section)  Progress towards OT goals: Progressing toward goals  Acute Rehab OT Goals Patient Stated Goal: regain strength OT Goal Formulation: With patient Time For Goal Achievement: 01/22/19 Potential to Achieve Goals: Good  Plan Discharge plan remains appropriate;Frequency remains appropriate    Co-evaluation                 AM-PAC OT "6 Clicks" Daily Activity     Outcome Measure   Help from another person eating meals?: None Help from another person taking care of personal grooming?: A Little Help from another person toileting, which includes using toliet, bedpan, or urinal?: A Little Help from another person bathing (including washing, rinsing, drying)?: A Lot Help from another person to put on and taking off regular upper body clothing?: A Little Help from another person to put on and taking off regular lower body clothing?: Total 6 Click Score: 16    End of Session Equipment Utilized During  Treatment: Back brace  OT Visit Diagnosis: Other abnormalities of gait and mobility (R26.89);Pain;Muscle weakness (generalized) (M62.81) Pain - part of body: (back)   Activity Tolerance Patient tolerated treatment well   Patient Left with call bell/phone within reach;in chair;with chair alarm set   Nurse Communication          Time: [id number] OT Time Calculation (min): 26 min  Charges: OT General Charges $OT Visit: 1 Visit OT Treatments $Self Care/Home Management : 23-37 mins  Dalphine HandingKaylee Fusaye Wachtel, MSOT, OTR/L Behavioral Health OT/ Acute Relief OT [facility] Office: [phone]    Dalphine HandingKaylee Hameed Kolar 01/11/2019, 1:31 PM

## 2019-01-11 NOTE — Plan of Care (Signed)
  Problem: Education: Goal: Knowledge of General Education information will improve Description: Including pain rating scale, medication(s)/side effects and non-pharmacologic comfort measures Outcome: Progressing   Problem: Clinical Measurements: Goal: Will remain free from infection Outcome: Progressing Goal: Diagnostic test results will improve Outcome: Progressing Goal: Respiratory complications will improve Outcome: Progressing   Problem: Activity: Goal: Risk for activity intolerance will decrease Outcome: Progressing   Problem: Pain Managment: Goal: General experience of comfort will improve Outcome: Progressing   Problem: Safety: Goal: Ability to remain free from injury will improve Outcome: Progressing   Problem: Skin Integrity: Goal: Risk for impaired skin integrity will decrease Outcome: Progressing   

## 2019-01-11 NOTE — Plan of Care (Signed)

## 2019-01-11 NOTE — Progress Notes (Signed)
Physical Therapy Treatment Patient Details Name: Chase Gonzalez Lehtinen MRN: [id number] DOB: 1960-07-25 Today's Date: 01/11/2019    History of Present Illness Pt is a 58 y/o male s/p L4-5 L5-S1 PLIF on 7/14 returing with increased back pain and fever 7/25.   PMH includes arthritis, L TKA, and s/p ACDF.     PT Comments    Pt progressing slowly but continues to be unsafe for direct return home, discussed CIR with him and he admits today that his wife is nervous about being able to care for him at his current level. Required consistent min A for transfers and occasionally mod as well as fatiguing very quickly with gait and knees beginning to buckle. Ambulated 8480' with RW and min A but last 20' tenuous. PT will continue to follow.    Follow Up Recommendations  CIR     Equipment Recommendations  None recommended by PT    Recommendations for Other Services Rehab consult     Precautions / Restrictions Precautions Precautions: Back Precaution Booklet Issued: No Precaution Comments: kept precautions with mobility today Required Braces or Orthoses: Spinal Brace Spinal Brace: Lumbar corset;Applied in sitting position Restrictions Weight Bearing Restrictions: No    Mobility  Bed Mobility               General bed mobility comments: received in chair  Transfers Overall transfer level: Needs assistance Equipment used: Rolling walker (2 wheeled) Transfers: Sit to/from Stand Sit to Stand: Min assist         General transfer comment: pt needed multiple attempts to stand from recliner, stood with heavy use of arm rests and min A  Ambulation/Gait Ambulation/Gait assistance: Min assist Gait Distance (Feet): 80 Feet Assistive device: Rolling walker (2 wheeled) Gait Pattern/deviations: Step-through pattern;Narrow base of support;Trunk flexed Gait velocity: decreased Gait velocity interpretation: <1.31 ft/sec, indicative of household ambulator General Gait Details: worked on self  monitoring so pt had to decide when to turn around to be able to get back to room but knees beginning to buckle by the time he returned to chair, needs more practice with this. Can extend knees and hips in static standing but with ambulation demonstrates outer hip and quad weakness with narrow base, knees flexed and trunk flexed   Stairs         General stair comments: lacking sufficient strength   Wheelchair Mobility    Modified Rankin (Stroke Patients Only)       Balance Overall balance assessment: Needs assistance Sitting-balance support: No upper extremity supported;Feet supported Sitting balance-Leahy Scale: Good     Standing balance support: Bilateral upper extremity supported;During functional activity Standing balance-Leahy Scale: Poor Standing balance comment: heavily reliant on UE support                            Cognition Arousal/Alertness: Awake/alert Behavior During Therapy: WFL for tasks assessed/performed Overall Cognitive Status: Within Functional Limits for tasks assessed                                        Exercises General Exercises - Lower Extremity Ankle Circles/Pumps: AROM;Both;10 reps;Seated Long Arc Quad: AROM;Both;Limitations;Seated;10 reps Long [employer] Quad Limitations: unable to fully extend knees due to weakness R>L    General Comments General comments (skin integrity, edema, etc.): ambulated in socks today instead of shoes due to edema which helped foot  discomfort but did not help his already narrow stance      Pertinent Vitals/Pain Pain Assessment: Faces Faces Pain Scale: Hurts whole lot Pain Location: back Pain Descriptors / Indicators: Operative site guarding;Sharp Pain Intervention(s): Monitored during session;Limited activity within patient's tolerance    Home Living                      Prior Function            PT Goals (current goals can now be found in the care plan section) Acute  Rehab PT Goals Patient Stated Goal: regain strength PT Goal Formulation: With patient Time For Goal Achievement: 01/23/19 Potential to Achieve Goals: Good Progress towards PT goals: Progressing toward goals    Frequency    Min 5X/week      PT Plan Current plan remains appropriate    Co-evaluation              AM-PAC PT "6 Clicks" Mobility   Outcome Measure  Help needed turning from your back to your side while in a flat bed without using bedrails?: A Little Help needed moving from lying on your back to sitting on the side of a flat bed without using bedrails?: A Little Help needed moving to and from a bed to a chair (including a wheelchair)?: A Lot Help needed standing up from a chair using your arms (e.g., wheelchair or bedside chair)?: A Lot Help needed to walk in hospital room?: A Little Help needed climbing 3-5 steps with a railing? : A Lot 6 Click Score: 15    End of Session Equipment Utilized During Treatment: Gait belt;Back brace Activity Tolerance: Patient tolerated treatment well Patient left: in chair;with call bell/phone within reach;with chair alarm set Nurse Communication: Mobility status PT Visit Diagnosis: Unsteadiness on feet (R26.81);Muscle weakness (generalized) (M62.81);Pain Pain - part of body: (back)     Time: 5329-9242 PT Time Calculation (min) (ACUTE ONLY): 21 min  Charges:  $Gait Training: 8-22 mins                     Leighton Roach, [place]  Pager [phone] Office [place] 01/11/2019, 11:39 AM

## 2019-01-12 LAB — CULTURE, BLOOD (ROUTINE X 2)
Culture: NO GROWTH
Special Requests: ADEQUATE

## 2019-01-12 MED ORDER — CIPROFLOXACIN HCL 750 MG PO TABS: 750.0000 mg | ORAL_TABLET | Freq: Two times a day (BID) | ORAL | 0 refills | Status: AC

## 2019-01-12 MED ORDER — CIPROFLOXACIN HCL 500 MG PO TABS
750.0000 mg | ORAL_TABLET | Freq: Two times a day (BID) | ORAL | Status: DC
  Administered 2019-01-12: 750 mg via ORAL
  Filled 2019-01-12: qty 2

## 2019-01-12 MED ORDER — HYDROCODONE-ACETAMINOPHEN 10-325 MG PO TABS: 1.0000 | ORAL_TABLET | ORAL | 0 refills | Status: DC | PRN

## 2019-01-12 MED ORDER — OXYCODONE HCL 10 MG PO TABS: 10.0000 mg | ORAL_TABLET | Freq: Four times a day (QID) | ORAL | 0 refills | Status: DC | PRN

## 2019-01-12 NOTE — TOC Progression Note (Signed)
Transition of Care [facility]) - Progression Note    Patient Details  Name: Chase Gonzalez MRN: [id number] Date of Birth: 19-Sep-1960  Transition of Care [facility]) CM/SW Contact  Bartholomew Crews, RN Phone Number: [phone] 01/12/2019, 4:25 PM  Clinical Narrative:    Advised by social worker - Caryl Pina - that patient was declining opportunity for [facility]. Outpatient PT desired. Referral placed in Epic.       Expected Discharge Plan and Services           Expected Discharge Date: 01/12/19                                     Social Determinants of Health (SDOH) Interventions    Readmission Risk Interventions No flowsheet data found.

## 2019-01-12 NOTE — Progress Notes (Signed)
Physical Therapy Treatment Patient Details Name: Chase Gonzalez MRN: [id number] DOB: 1960/12/08 Today's Date: 01/12/2019    History of Present Illness Pt is a 58 y/o male s/p L4-5 L5-S1 PLIF on 7/14 returing with increased back pain and fever 7/25.   PMH includes arthritis, L TKA, and s/p ACDF.     PT Comments    Pt a bit more limited this session as compared to yesterday secondary to worsening pain. Pt reported that he sat up in the chair for 8 hours yesterday. He continues to require min A overall for mobility with use of RW to ambulate. Pt would continue to benefit from skilled physical therapy services at this time while admitted and after d/c to address the below listed limitations in order to improve overall safety and independence with functional mobility.   Follow Up Recommendations  CIR     Equipment Recommendations  None recommended by PT    Recommendations for Other Services       Precautions / Restrictions Precautions Precautions: Back Precaution Booklet Issued: No Precaution Comments: pt recalled 3/3 Required Braces or Orthoses: Spinal Brace Spinal Brace: Lumbar corset;Applied in sitting position Restrictions Weight Bearing Restrictions: No    Mobility  Bed Mobility Overal bed mobility: Needs Assistance Bed Mobility: Rolling;Sidelying to Sit Rolling: Min guard Sidelying to sit: Min assist       General bed mobility comments: increased time and effort, good log roll technique, assistance needed for trunk elevation to sit EOB  Transfers Overall transfer level: Needs assistance Equipment used: Rolling walker (2 wheeled) Transfers: Sit to/from Stand Sit to Stand: Min assist         General transfer comment: assistance needed to power into standing from EOB, cueing for safe hand placement  Ambulation/Gait Ambulation/Gait assistance: Min assist Gait Distance (Feet): 40 Feet Assistive device: Rolling walker (2 wheeled) Gait Pattern/deviations:  Step-through pattern;Narrow base of support;Trunk flexed Gait velocity: decreased   General Gait Details: pt with very slow, antalgic gait; increasing R sided back pain; requiring frequent standing rest breaks   Stairs             Wheelchair Mobility    Modified Rankin (Stroke Patients Only)       Balance Overall balance assessment: Needs assistance Sitting-balance support: No upper extremity supported;Feet supported Sitting balance-Leahy Scale: Good     Standing balance support: Bilateral upper extremity supported Standing balance-Leahy Scale: Poor Standing balance comment: heavily reliant on UE support                            Cognition Arousal/Alertness: Awake/alert Behavior During Therapy: WFL for tasks assessed/performed Overall Cognitive Status: Within Functional Limits for tasks assessed                                        Exercises      General Comments        Pertinent Vitals/Pain Pain Assessment: 0-10 Pain Score: 6  Pain Location: R side of back Pain Descriptors / Indicators: Burning Pain Intervention(s): Monitored during session;Repositioned    Home Living                      Prior Function            PT Goals (current goals can now be found in the care plan section) Acute  Rehab PT Goals PT Goal Formulation: With patient Time For Goal Achievement: 01/23/19 Potential to Achieve Goals: Good Progress towards PT goals: Progressing toward goals    Frequency    Min 5X/week      PT Plan Current plan remains appropriate    Co-evaluation              AM-PAC PT "6 Clicks" Mobility   Outcome Measure  Help needed turning from your back to your side while in a flat bed without using bedrails?: A Little Help needed moving from lying on your back to sitting on the side of a flat bed without using bedrails?: A Little Help needed moving to and from a bed to a chair (including a wheelchair)?: A  Lot Help needed standing up from a chair using your arms (e.g., wheelchair or bedside chair)?: A Little Help needed to walk in hospital room?: A Little Help needed climbing 3-5 steps with a railing? : A Lot 6 Click Score: 16    End of Session Equipment Utilized During Treatment: Gait belt;Back brace Activity Tolerance: Patient limited by pain Patient left: in chair;with call bell/phone within reach Nurse Communication: Mobility status PT Visit Diagnosis: Unsteadiness on feet (R26.81);Muscle weakness (generalized) (M62.81);Pain Pain - Right/Left: Right Pain - part of body: (back)     Time: 7169-6789 PT Time Calculation (min) (ACUTE ONLY): 22 min  Charges:  $Therapeutic Activity: 8-22 mins                     Sherie Don, PT, DPT  Acute Rehabilitation Services Pager [phone] Office [place] 01/12/2019, 11:57 AM

## 2019-01-12 NOTE — Plan of Care (Signed)

## 2019-01-12 NOTE — Progress Notes (Signed)
Patient discharging home. Discharge instruction explained to patient including wound care and out patient therapy. Patient verbalized understanding. Took all personal belongings. Dressing changed to lower back. No further questions or concerns voiced.

## 2019-01-12 NOTE — Plan of Care (Signed)

## 2019-01-12 NOTE — Progress Notes (Signed)
Inpatient Rehabilitation Admissions Coordinator  I have insurance approval for CIR admit, BUT no bed available today. I spoke with pt's wife by phone and met with patient at bedside. Pt does not want to remain in house to await for a possible bed availability. He wishes to pursue d/c home. I notified Meghan, NP, as well as SW. Caryl Pina of plans for d/c today and need for [facility]. We will sign off at this time.  Danne Baxter, RN, MSN Rehab Admissions Coordinator [phone] 01/12/2019 11:05 AM

## 2019-01-12 NOTE — Discharge Instructions (Signed)
Pain Medicine Instructions You may need pain medicine after an injury or illness. Two common types of pain medicine are:  Opioid pain medicine. These may be called opioids.  Non-opioid pain medicine. This includes NSAIDs. It is important to follow your doctor's instructions when you are taking pain medicine. Doing this can keep yourself and others safe. How can pain medicine affect me? Pain medicine may not make all of your pain go away. It should make you comfortable enough to:  Move.  Breathe.  Do normal activities. Opioids can cause side effects, such as:  Trouble pooping (constipation).  Feeling sick to your stomach (nausea).  Throwing up (vomiting).  Feeling very sleepy.  Confusion.  Taking the medicine for nonmedical reasons even though taking it hurts your health and well-being (opioid use disorder).  Trouble breathing (respiratory depression). Taking opioids for longer than 3 days raises your risk of these side effects. Taking opioids for a long time can affect how well you can do daily tasks. Taking them for a long time also puts you at risk for:  Car crashes.  Depression.  Suicide.  Heart attack.  Taking too much of the medicine (overdose). This can lead to death. What should I do to stay safe while taking pain medicine? Take your medicine as told  Take pain medicine exactly as told by your doctor. Take it only when you need it.  Write down the times when you take your pain medicine. Look at the times before you take your next dose.  Take other over-the-counter or prescription medicines only as told by your doctor. ? If your pain medicine has acetaminophen in it, do not take any other acetaminophen while you are taking this medicine. Too much can damage the liver.  Get pain medicine prescriptions from only one doctor. Avoid certain activities While you are taking prescription pain medicine, and for 8 hours after your last dose:  Do not drive.  Do  not use machinery.  Do not use power tools.  Do not sign legal documents.  Do not drink alcohol.  Do not take sleeping pills.  Do not take care of children by yourself.  Do not do any activities that involve climbing or being in high places.  Do not go into any body of water unless there is an adult nearby who can watch you and help you if needed. This includes: ? Lakes. ? Rivers. ? Oceans. ? Spas. ? Swimming pools.  Keep others safe  Store your medicine as told by your doctor. Keep it where children and pets cannot reach it.  Do not share your pain medicine with anyone.  Do not save any leftover pills. If you have leftover pills, you can: ? Bring them to a take-back program. ? Bring them to a pharmacy that has a drug disposal container. ? Throw them in the trash. Check the medicine label or package insert to see if it is safe to throw it out. If it is safe, take the medicine out of the container. Mix it with something that makes it unusable, such as pet waste. Then put the medicine in the trash. General instructions  Talk with your doctor about other ways to manage your pain.  If you have trouble pooping: ? Drink enough fluid to keep your pee (urine) pale yellow. ? Use a poop (stool) softener as told by your doctor. ? Eat more fruits and vegetables.  Keep all follow-up visits as told by your doctor. This is important. Contact a   doctor if:  Your medicine is not helping with your pain.  You have a rash.  You feel depressed. Get help right away if: Seek medical care right away if you are taking pain medicines and you (or people close to you) notice any of the following:  Trouble breathing.  Breathing that is shorter than normal.  Breathing that is more shallow than normal.  Confusion.  Sleepiness.  Trouble staying awake.  Feeling sick to your stomach.  Throwing up.  Your skin or lips turning pale or bluish in color.  Tongue swelling. If you ever  feel like you may hurt yourself or others, or have thoughts about taking your own life, get help right away. Go to your nearest emergency department or call:  Your local emergency services (911 in the [place]  A suicide crisis helpline, such as the National Suicide Prevention Lifeline at 1-800-273-8255. This is open 24 hours a day. Summary  Take your pain medicine exactly as told by your doctor.  Pain medicine can help lower your pain. It may also cause side effects.  Talk with your doctor about other ways to manage your pain.  Follow your doctor's instructions about how to take your pain medicine and keep others safe. Ask what activities you should avoid while taking pain medicine. This information is not intended to replace advice given to you by your health care provider. Make sure you discuss any questions you have with your health care provider. Document Released: 11/19/2007 Document Revised: 05/15/2017 Document Reviewed: 01/12/2017 Elsevier Patient Education  2020 [employer]  

## 2019-01-12 NOTE — Discharge Summary (Signed)
Physician Discharge Summary    Providing Compassionate, Quality Care - Together  Patient ID: Chase Gonzalez MRN: [id number] DOB/AGE: Aug 05, 1960 58 y.o.  Admit date: 01/07/2019 Discharge date: 01/12/2019  Admission Diagnoses: Post operative pain and wound drainage  Discharge Diagnoses:  Active Problems:   Post-operative pain   Postoperative pain   Discharged Condition: fair  Hospital Course: Patient was admitted on 01/08/2019 due to post operative pain, incisional drainage, and elevated temperature. He underwent a 2 level lumbar decompression and fusion on 12/28/2018. Blood culture on 01/08/2019 grew E. Coli. Patient was without s/s of sepsis. Patient was treated with IV Rocephin and transitiomed to PO cipro on 01/12/2019. He has worked with PT and OT. His pain is well-controlled. He would like to discharge home with home health.  Consults: rehabilitation medicine  Treatments: antibiotics: ceftriaxone  Discharge Exam: Blood pressure 97/69, pulse 87, temperature 98.3 F (36.8 C), temperature source Oral, resp. rate 16, height 5\' 11"  (1.803 m), weight 96.2 kg, SpO2 94 %.  Alert and oriented x 4 PERRLA Speech fluent CN II-XII grossly intact MAE Strength 5/5 BUE, 3/5 BLE Incision with brownish drainage, dressing changed while at bedside  Disposition: Discharge disposition: 01-Home or Self Care       Discharge Instructions    Face-to-face encounter (required for Medicare/Medicaid patients)   Complete by: As directed    I [name] certify that this patient is under my care and that I, or a nurse practitioner or physician's assistant working with me, had a face-to-face encounter that meets the physician face-to-face encounter requirements with this patient on 01/12/2019. The encounter with the patient was in whole, or in part for the following medical condition(s) which is the primary reason for home health care (List medical condition): Status post posterior lumbar interbody  fusion   The encounter with the patient was in whole, or in part, for the following medical condition, which is the primary reason for home health care: Status post posterior lumbar interbody fusion   I certify that, based on my findings, the following services are medically necessary home health services: Physical therapy   Reason for Medically Necessary Home Health Services: Therapy- Therapeutic Exercises to Increase Strength and Endurance   My clinical findings support the need for the above services: Unable to leave home safely without assistance and/or assistive device   Further, I certify that my clinical findings support that this patient is homebound due to: Unable to leave home safely without assistance   Home Health   Complete by: As directed    To provide the following care/treatments:  PT OT       Allergies as of 01/12/2019   No Known Allergies     Medication List    STOP taking these medications   diphenhydramine-acetaminophen 25-500 MG Tabs tablet Commonly known as: TYLENOL PM   methylPREDNISolone 4 MG Tbpk tablet Commonly known as: MEDROL DOSEPAK   naproxen sodium 220 MG tablet Commonly known as: ALEVE   oxyCODONE-acetaminophen 10-325 MG tablet Commonly known as: PERCOCET   oxymetazoline 0.05 % nasal spray Commonly known as: AFRIN     TAKE these medications   cholecalciferol 25 MCG (1000 UT) tablet Commonly known as: VITAMIN D3 Take 1,000 Units by mouth daily.   ciprofloxacin 750 MG tablet Commonly known as: CIPRO Take 1 tablet (750 mg total) by mouth 2 (two) times daily.   diazepam 5 MG tablet Commonly known as: VALIUM Take 1-2 tablets (5-10 mg total) by mouth every 6 (six)  hours as needed for muscle spasms.   diphenhydrAMINE 25 MG tablet Commonly known as: BENADRYL Take 25 mg by mouth every 4 (four) hours as needed (reaction to oxycodone (palpitations/ sweat/clammy)).   docusate sodium 100 MG capsule Commonly known as: COLACE Take 200 mg by mouth  daily.   Fish Oil 1000 MG Caps Take 1,000 mg by mouth daily.   GLUCOSAMINE CHOND MSM FORMULA PO Take 1 tablet by mouth daily.   HYDROcodone-acetaminophen 10-325 MG tablet Commonly known as: NORCO Take 1 tablet by mouth every 4 (four) hours as needed for moderate pain ((score 4 to 6)).   magnesium oxide 400 MG tablet Commonly known as: MAG-OX Take 400 mg by mouth daily.   meloxicam 15 MG tablet Commonly known as: MOBIC Take 15 mg by mouth daily.   multivitamin with minerals tablet Take 1 tablet by mouth daily.   Oxycodone HCl 10 MG Tabs Take 1 tablet (10 mg total) by mouth every 6 (six) hours as needed for severe pain ((score 7 to 10)).        Signed: Floreen ComberMeghan D Jamya Starry 01/12/2019, 11:44 AM

## 2019-01-17 DIAGNOSIS — L89322 Pressure ulcer of left buttock, stage 2: Secondary | ICD-10-CM | POA: Diagnosis not present

## 2019-01-17 DIAGNOSIS — Z4789 Encounter for other orthopedic aftercare: Secondary | ICD-10-CM | POA: Diagnosis not present

## 2019-01-17 DIAGNOSIS — G8918 Other acute postprocedural pain: Secondary | ICD-10-CM | POA: Diagnosis not present

## 2019-01-20 DIAGNOSIS — L89322 Pressure ulcer of left buttock, stage 2: Secondary | ICD-10-CM | POA: Diagnosis not present

## 2019-01-20 DIAGNOSIS — G8918 Other acute postprocedural pain: Secondary | ICD-10-CM | POA: Diagnosis not present

## 2019-01-20 DIAGNOSIS — Z4789 Encounter for other orthopedic aftercare: Secondary | ICD-10-CM | POA: Diagnosis not present

## 2019-01-21 DIAGNOSIS — Z4789 Encounter for other orthopedic aftercare: Secondary | ICD-10-CM | POA: Diagnosis not present

## 2019-01-21 DIAGNOSIS — L89322 Pressure ulcer of left buttock, stage 2: Secondary | ICD-10-CM | POA: Diagnosis not present

## 2019-01-21 DIAGNOSIS — G8918 Other acute postprocedural pain: Secondary | ICD-10-CM | POA: Diagnosis not present

## 2019-01-24 DIAGNOSIS — Z4789 Encounter for other orthopedic aftercare: Secondary | ICD-10-CM | POA: Diagnosis not present

## 2019-01-24 DIAGNOSIS — L89322 Pressure ulcer of left buttock, stage 2: Secondary | ICD-10-CM | POA: Diagnosis not present

## 2019-01-24 DIAGNOSIS — G8918 Other acute postprocedural pain: Secondary | ICD-10-CM | POA: Diagnosis not present

## 2019-01-25 DIAGNOSIS — G8918 Other acute postprocedural pain: Secondary | ICD-10-CM | POA: Diagnosis not present

## 2019-01-25 DIAGNOSIS — L89322 Pressure ulcer of left buttock, stage 2: Secondary | ICD-10-CM | POA: Diagnosis not present

## 2019-01-25 DIAGNOSIS — Z4789 Encounter for other orthopedic aftercare: Secondary | ICD-10-CM | POA: Diagnosis not present

## 2019-01-26 DIAGNOSIS — L89322 Pressure ulcer of left buttock, stage 2: Secondary | ICD-10-CM | POA: Diagnosis not present

## 2019-01-26 DIAGNOSIS — G8918 Other acute postprocedural pain: Secondary | ICD-10-CM | POA: Diagnosis not present

## 2019-01-26 DIAGNOSIS — Z4789 Encounter for other orthopedic aftercare: Secondary | ICD-10-CM | POA: Diagnosis not present

## 2019-01-28 DIAGNOSIS — G8918 Other acute postprocedural pain: Secondary | ICD-10-CM | POA: Diagnosis not present

## 2019-01-28 DIAGNOSIS — L89322 Pressure ulcer of left buttock, stage 2: Secondary | ICD-10-CM | POA: Diagnosis not present

## 2019-01-28 DIAGNOSIS — Z4789 Encounter for other orthopedic aftercare: Secondary | ICD-10-CM | POA: Diagnosis not present

## 2019-01-31 DIAGNOSIS — G8918 Other acute postprocedural pain: Secondary | ICD-10-CM | POA: Diagnosis not present

## 2019-01-31 DIAGNOSIS — Z4789 Encounter for other orthopedic aftercare: Secondary | ICD-10-CM | POA: Diagnosis not present

## 2019-01-31 DIAGNOSIS — L89322 Pressure ulcer of left buttock, stage 2: Secondary | ICD-10-CM | POA: Diagnosis not present

## 2019-02-02 DIAGNOSIS — G8918 Other acute postprocedural pain: Secondary | ICD-10-CM | POA: Diagnosis not present

## 2019-02-02 DIAGNOSIS — L89322 Pressure ulcer of left buttock, stage 2: Secondary | ICD-10-CM | POA: Diagnosis not present

## 2019-02-02 DIAGNOSIS — Z4789 Encounter for other orthopedic aftercare: Secondary | ICD-10-CM | POA: Diagnosis not present

## 2019-02-03 DIAGNOSIS — Z4789 Encounter for other orthopedic aftercare: Secondary | ICD-10-CM | POA: Diagnosis not present

## 2019-02-03 DIAGNOSIS — L89322 Pressure ulcer of left buttock, stage 2: Secondary | ICD-10-CM | POA: Diagnosis not present

## 2019-02-03 DIAGNOSIS — G8918 Other acute postprocedural pain: Secondary | ICD-10-CM | POA: Diagnosis not present

## 2019-02-04 DIAGNOSIS — Z4789 Encounter for other orthopedic aftercare: Secondary | ICD-10-CM | POA: Diagnosis not present

## 2019-02-04 DIAGNOSIS — L89322 Pressure ulcer of left buttock, stage 2: Secondary | ICD-10-CM | POA: Diagnosis not present

## 2019-02-04 DIAGNOSIS — G8918 Other acute postprocedural pain: Secondary | ICD-10-CM | POA: Diagnosis not present

## 2019-02-07 DIAGNOSIS — L89322 Pressure ulcer of left buttock, stage 2: Secondary | ICD-10-CM | POA: Diagnosis not present

## 2019-02-07 DIAGNOSIS — Z4789 Encounter for other orthopedic aftercare: Secondary | ICD-10-CM | POA: Diagnosis not present

## 2019-02-07 DIAGNOSIS — G8918 Other acute postprocedural pain: Secondary | ICD-10-CM | POA: Diagnosis not present

## 2019-02-10 DIAGNOSIS — Z4789 Encounter for other orthopedic aftercare: Secondary | ICD-10-CM | POA: Diagnosis not present

## 2019-02-10 DIAGNOSIS — L89322 Pressure ulcer of left buttock, stage 2: Secondary | ICD-10-CM | POA: Diagnosis not present

## 2019-02-10 DIAGNOSIS — G8918 Other acute postprocedural pain: Secondary | ICD-10-CM | POA: Diagnosis not present

## 2019-02-14 DIAGNOSIS — G8918 Other acute postprocedural pain: Secondary | ICD-10-CM | POA: Diagnosis not present

## 2019-02-14 DIAGNOSIS — L89322 Pressure ulcer of left buttock, stage 2: Secondary | ICD-10-CM | POA: Diagnosis not present

## 2019-02-14 DIAGNOSIS — Z4789 Encounter for other orthopedic aftercare: Secondary | ICD-10-CM | POA: Diagnosis not present

## 2019-02-15 DIAGNOSIS — G8918 Other acute postprocedural pain: Secondary | ICD-10-CM | POA: Diagnosis not present

## 2019-02-15 DIAGNOSIS — L89322 Pressure ulcer of left buttock, stage 2: Secondary | ICD-10-CM | POA: Diagnosis not present

## 2019-02-15 DIAGNOSIS — Z4789 Encounter for other orthopedic aftercare: Secondary | ICD-10-CM | POA: Diagnosis not present

## 2019-02-17 DIAGNOSIS — G8918 Other acute postprocedural pain: Secondary | ICD-10-CM | POA: Diagnosis not present

## 2019-02-17 DIAGNOSIS — Z4789 Encounter for other orthopedic aftercare: Secondary | ICD-10-CM | POA: Diagnosis not present

## 2019-02-17 DIAGNOSIS — M4317 Spondylolisthesis, lumbosacral region: Secondary | ICD-10-CM | POA: Diagnosis not present

## 2019-02-17 DIAGNOSIS — L89322 Pressure ulcer of left buttock, stage 2: Secondary | ICD-10-CM | POA: Diagnosis not present

## 2019-02-18 DIAGNOSIS — L89322 Pressure ulcer of left buttock, stage 2: Secondary | ICD-10-CM | POA: Diagnosis not present

## 2019-02-18 DIAGNOSIS — Z4789 Encounter for other orthopedic aftercare: Secondary | ICD-10-CM | POA: Diagnosis not present

## 2019-02-18 DIAGNOSIS — G8918 Other acute postprocedural pain: Secondary | ICD-10-CM | POA: Diagnosis not present

## 2019-02-22 DIAGNOSIS — L89322 Pressure ulcer of left buttock, stage 2: Secondary | ICD-10-CM | POA: Diagnosis not present

## 2019-02-22 DIAGNOSIS — Z4789 Encounter for other orthopedic aftercare: Secondary | ICD-10-CM | POA: Diagnosis not present

## 2019-02-22 DIAGNOSIS — G8918 Other acute postprocedural pain: Secondary | ICD-10-CM | POA: Diagnosis not present

## 2019-02-23 DIAGNOSIS — Z4789 Encounter for other orthopedic aftercare: Secondary | ICD-10-CM | POA: Diagnosis not present

## 2019-02-23 DIAGNOSIS — G8918 Other acute postprocedural pain: Secondary | ICD-10-CM | POA: Diagnosis not present

## 2019-02-23 DIAGNOSIS — L89322 Pressure ulcer of left buttock, stage 2: Secondary | ICD-10-CM | POA: Diagnosis not present

## 2019-02-24 DIAGNOSIS — L89322 Pressure ulcer of left buttock, stage 2: Secondary | ICD-10-CM | POA: Diagnosis not present

## 2019-02-24 DIAGNOSIS — Z4789 Encounter for other orthopedic aftercare: Secondary | ICD-10-CM | POA: Diagnosis not present

## 2019-02-24 DIAGNOSIS — G8918 Other acute postprocedural pain: Secondary | ICD-10-CM | POA: Diagnosis not present

## 2019-02-25 DIAGNOSIS — L89322 Pressure ulcer of left buttock, stage 2: Secondary | ICD-10-CM | POA: Diagnosis not present

## 2019-02-25 DIAGNOSIS — Z4789 Encounter for other orthopedic aftercare: Secondary | ICD-10-CM | POA: Diagnosis not present

## 2019-02-25 DIAGNOSIS — G8918 Other acute postprocedural pain: Secondary | ICD-10-CM | POA: Diagnosis not present

## 2019-03-01 DIAGNOSIS — L89322 Pressure ulcer of left buttock, stage 2: Secondary | ICD-10-CM | POA: Diagnosis not present

## 2019-03-01 DIAGNOSIS — G8918 Other acute postprocedural pain: Secondary | ICD-10-CM | POA: Diagnosis not present

## 2019-03-01 DIAGNOSIS — Z4789 Encounter for other orthopedic aftercare: Secondary | ICD-10-CM | POA: Diagnosis not present

## 2019-03-02 DIAGNOSIS — L89322 Pressure ulcer of left buttock, stage 2: Secondary | ICD-10-CM | POA: Diagnosis not present

## 2019-03-02 DIAGNOSIS — G8918 Other acute postprocedural pain: Secondary | ICD-10-CM | POA: Diagnosis not present

## 2019-03-02 DIAGNOSIS — Z4789 Encounter for other orthopedic aftercare: Secondary | ICD-10-CM | POA: Diagnosis not present

## 2019-03-03 DIAGNOSIS — G8918 Other acute postprocedural pain: Secondary | ICD-10-CM | POA: Diagnosis not present

## 2019-03-03 DIAGNOSIS — Z4789 Encounter for other orthopedic aftercare: Secondary | ICD-10-CM | POA: Diagnosis not present

## 2019-03-03 DIAGNOSIS — L89322 Pressure ulcer of left buttock, stage 2: Secondary | ICD-10-CM | POA: Diagnosis not present

## 2019-03-08 DIAGNOSIS — Z4789 Encounter for other orthopedic aftercare: Secondary | ICD-10-CM | POA: Diagnosis not present

## 2019-03-08 DIAGNOSIS — L89322 Pressure ulcer of left buttock, stage 2: Secondary | ICD-10-CM | POA: Diagnosis not present

## 2019-03-08 DIAGNOSIS — G8918 Other acute postprocedural pain: Secondary | ICD-10-CM | POA: Diagnosis not present

## 2019-03-09 DIAGNOSIS — G8918 Other acute postprocedural pain: Secondary | ICD-10-CM | POA: Diagnosis not present

## 2019-03-09 DIAGNOSIS — L89322 Pressure ulcer of left buttock, stage 2: Secondary | ICD-10-CM | POA: Diagnosis not present

## 2019-03-09 DIAGNOSIS — Z4789 Encounter for other orthopedic aftercare: Secondary | ICD-10-CM | POA: Diagnosis not present

## 2019-03-10 DIAGNOSIS — L89322 Pressure ulcer of left buttock, stage 2: Secondary | ICD-10-CM | POA: Diagnosis not present

## 2019-03-10 DIAGNOSIS — G8918 Other acute postprocedural pain: Secondary | ICD-10-CM | POA: Diagnosis not present

## 2019-03-10 DIAGNOSIS — Z4789 Encounter for other orthopedic aftercare: Secondary | ICD-10-CM | POA: Diagnosis not present

## 2019-03-16 DIAGNOSIS — L89322 Pressure ulcer of left buttock, stage 2: Secondary | ICD-10-CM | POA: Diagnosis not present

## 2019-03-16 DIAGNOSIS — G8918 Other acute postprocedural pain: Secondary | ICD-10-CM | POA: Diagnosis not present

## 2019-03-16 DIAGNOSIS — Z4789 Encounter for other orthopedic aftercare: Secondary | ICD-10-CM | POA: Diagnosis not present

## 2019-03-17 DIAGNOSIS — M4317 Spondylolisthesis, lumbosacral region: Secondary | ICD-10-CM | POA: Diagnosis not present

## 2019-03-22 DIAGNOSIS — G8918 Other acute postprocedural pain: Secondary | ICD-10-CM | POA: Diagnosis not present

## 2019-03-22 DIAGNOSIS — Z4789 Encounter for other orthopedic aftercare: Secondary | ICD-10-CM | POA: Diagnosis not present

## 2019-04-21 DIAGNOSIS — M4317 Spondylolisthesis, lumbosacral region: Secondary | ICD-10-CM | POA: Diagnosis not present

## 2019-05-30 DIAGNOSIS — L570 Actinic keratosis: Secondary | ICD-10-CM | POA: Diagnosis not present

## 2019-05-30 DIAGNOSIS — L905 Scar conditions and fibrosis of skin: Secondary | ICD-10-CM | POA: Diagnosis not present

## 2019-05-30 DIAGNOSIS — L578 Other skin changes due to chronic exposure to nonionizing radiation: Secondary | ICD-10-CM | POA: Diagnosis not present

## 2019-05-30 DIAGNOSIS — L814 Other melanin hyperpigmentation: Secondary | ICD-10-CM | POA: Diagnosis not present

## 2019-05-30 DIAGNOSIS — D225 Melanocytic nevi of trunk: Secondary | ICD-10-CM | POA: Diagnosis not present

## 2019-05-31 DIAGNOSIS — Z6828 Body mass index (BMI) 28.0-28.9, adult: Secondary | ICD-10-CM | POA: Diagnosis not present

## 2019-05-31 DIAGNOSIS — M4317 Spondylolisthesis, lumbosacral region: Secondary | ICD-10-CM | POA: Diagnosis not present

## 2019-05-31 DIAGNOSIS — R030 Elevated blood-pressure reading, without diagnosis of hypertension: Secondary | ICD-10-CM | POA: Diagnosis not present

## 2019-11-24 ENCOUNTER — Other Ambulatory Visit: Payer: Self-pay | Admitting: Neurosurgery

## 2019-12-06 NOTE — Pre-Procedure Instructions (Signed)
[facility] - [place] - [address] [address]  [address] [place] [address] Phone: [phone] Fax: [phone]     Your procedure is scheduled on Friday, June 25 from 10:10 AM- 11:28 AM.  Report to [facility] Main Entrance "A" at 08:10 A.M., and check in at the Admitting office.  Call this number if you have problems the morning of surgery:  [phone]  Call [phone] if you have any questions prior to your surgery date Monday-Friday 8am-4pm.    Remember:  Do not eat or drink after midnight the night before your surgery.    Take these medicines the morning of surgery with A SIP OF WATER: omeprazole (PRILOSEC)  IF NEEDED: diazepam (VALIUM) oxyCODONE    As of today, STOP taking any Aspirin (unless otherwise instructed by your surgeon) and Aspirin containing products, meloxicam (MOBIC), Aleve, Naproxen, Ibuprofen, Motrin, Advil, Goody's, BC's, all herbal medications, fish oil, and all vitamins.          The Morning of Surgery:            Do not wear jewelry.            Do not wear lotions, powders, colognes, or deodorant.            Men may shave face and neck.            Do not bring valuables to the hospital.            [facility] is not responsible for any belongings or valuables.  Do NOT Smoke (Tobacco/Vapping) or drink Alcohol 24 hours prior to your procedure.  If you use a CPAP at night, you may bring all equipment for your overnight stay.   Contacts, glasses, dentures or bridgework may not be worn into surgery.      For patients admitted to the hospital, discharge time will be determined by your treatment team.   Patients discharged the day of surgery will not be allowed to drive home, and someone needs to stay with them for 24 hours.    Special instructions:   [facility]- Preparing For Surgery  Before surgery, you can play an important role. Because skin is not sterile, your skin needs to be as free of germs as possible. You can  reduce the number of germs on your skin by washing with CHG (chlorahexidine gluconate) Soap before surgery.  CHG is an antiseptic cleaner which kills germs and bonds with the skin to continue killing germs even after washing.    Oral Hygiene is also important to reduce your risk of infection.  Remember - BRUSH YOUR TEETH THE MORNING OF SURGERY WITH YOUR REGULAR TOOTHPASTE  Please do not use if you have an allergy to CHG or antibacterial soaps. If your skin becomes reddened/irritated stop using the CHG.  Do not shave (including legs and underarms) for at least 48 hours prior to first CHG shower. It is OK to shave your face.  Please follow these instructions carefully.   1. Shower the NIGHT BEFORE SURGERY and the MORNING OF SURGERY with CHG Soap.   2. If you chose to wash your hair, wash your hair first as usual with your normal shampoo.  3. After you shampoo, rinse your hair and body thoroughly to remove the shampoo.  4. Use CHG as you would any other liquid soap. You can apply CHG directly to the skin and wash gently with a scrungie or a clean  washcloth.   5. Apply the CHG Soap to your body ONLY FROM THE NECK DOWN.  Do not use on open wounds or open sores. Avoid contact with your eyes, ears, mouth and genitals (private parts). Wash Face and genitals (private parts)  with your normal soap.   6. Wash thoroughly, paying special attention to the area where your surgery will be performed.  7. Thoroughly rinse your body with warm water from the neck down.  8. DO NOT shower/wash with your normal soap after using and rinsing off the CHG Soap.  9. Pat yourself dry with a CLEAN TOWEL.  10. Wear CLEAN PAJAMAS to bed the night before surgery, wear comfortable clothes the morning of surgery  11. Place CLEAN SHEETS on your bed the night of your first shower and DO NOT SLEEP WITH PETS.   Day of Surgery: Shower with CHG Soap.  Do not apply any deodorants/lotions.  Please wear clean clothes to the  hospital/surgery center.   Remember to brush your teeth WITH YOUR REGULAR TOOTHPASTE.   Please read over the following fact sheets that you were given.

## 2019-12-07 ENCOUNTER — Encounter (HOSPITAL_COMMUNITY)
Admission: RE | Admit: 2019-12-07 | Discharge: 2019-12-07 | Disposition: A | Discharge status: Home / Self Care | Payer: BLUE CROSS/BLUE SHIELD | Source: Ambulatory Visit | Attending: Neurosurgery | Admitting: Neurosurgery

## 2019-12-07 ENCOUNTER — Other Ambulatory Visit: Payer: Self-pay

## 2019-12-07 ENCOUNTER — Other Ambulatory Visit (HOSPITAL_COMMUNITY)
Admission: RE | Admit: 2019-12-07 | Discharge: 2019-12-07 | Disposition: A | Discharge status: Home / Self Care | Payer: BLUE CROSS/BLUE SHIELD | Source: Ambulatory Visit | Attending: Neurosurgery | Admitting: Neurosurgery

## 2019-12-07 ENCOUNTER — Encounter (HOSPITAL_COMMUNITY): Payer: Self-pay

## 2019-12-07 DIAGNOSIS — Z791 Long term (current) use of non-steroidal anti-inflammatories (NSAID): Secondary | ICD-10-CM | POA: Insufficient documentation

## 2019-12-07 DIAGNOSIS — T8484XA Pain due to internal orthopedic prosthetic devices, implants and grafts, initial encounter: Secondary | ICD-10-CM | POA: Insufficient documentation

## 2019-12-07 DIAGNOSIS — Z20822 Contact with and (suspected) exposure to covid-19: Secondary | ICD-10-CM | POA: Insufficient documentation

## 2019-12-07 DIAGNOSIS — Z01812 Encounter for preprocedural laboratory examination: Secondary | ICD-10-CM | POA: Insufficient documentation

## 2019-12-07 DIAGNOSIS — Z79899 Other long term (current) drug therapy: Secondary | ICD-10-CM | POA: Insufficient documentation

## 2019-12-07 HISTORY — DX: Personal history of urinary calculi: Z87.442

## 2019-12-07 LAB — BASIC METABOLIC PANEL
Anion gap: 15 (ref 5–15)
BUN: 17 mg/dL (ref 6–20)
CO2: 19 mmol/L — ABNORMAL LOW (ref 22–32)
Calcium: 9.1 mg/dL (ref 8.9–10.3)
Chloride: 105 mmol/L (ref 98–111)
Creatinine, Ser: 0.78 mg/dL (ref 0.61–1.24)
GFR calc Af Amer: >60 mL/min (ref >60)
GFR calc non Af Amer: >60 mL/min (ref >60)
Glucose, Bld: 93 mg/dL (ref 70–99)
Potassium: 4 mmol/L (ref 3.5–5.1)
Sodium: 139 mmol/L (ref 135–145)

## 2019-12-07 LAB — CBC WITH DIFFERENTIAL/PLATELET
Abs Immature Granulocytes: 0.02 10*3/uL (ref 0.00–0.07)
Basophils Absolute: 0.1 10*3/uL (ref 0.0–0.1)
Basophils Relative: 1 %
Eosinophils Absolute: 0.2 10*3/uL (ref 0.0–0.5)
Eosinophils Relative: 3 %
HCT: 47.6 % (ref 39.0–52.0)
Hemoglobin: 16.2 g/dL (ref 13.0–17.0)
Immature Granulocytes: 0 %
Lymphocytes Relative: 29 %
Lymphs Abs: 1.9 10*3/uL (ref 0.7–4.0)
MCH: 30.2 pg (ref 26.0–34.0)
MCHC: 34 g/dL (ref 30.0–36.0)
MCV: 88.6 fL (ref 80.0–100.0)
Monocytes Absolute: 0.5 10*3/uL (ref 0.1–1.0)
Monocytes Relative: 8 %
Neutro Abs: 3.9 10*3/uL (ref 1.7–7.7)
Neutrophils Relative %: 59 %
Platelets: 230 10*3/uL (ref 150–400)
RBC: 5.37 MIL/uL (ref 4.22–5.81)
RDW: 12.4 % (ref 11.5–15.5)
WBC: 6.7 10*3/uL (ref 4.0–10.5)
nRBC: 0 % (ref 0.0–0.2)

## 2019-12-07 LAB — SARS CORONAVIRUS 2 (TAT 6-24 HRS): SARS Coronavirus 2: NEGATIVE

## 2019-12-07 LAB — SURGICAL PCR SCREEN
MRSA, PCR: NEGATIVE
Staphylococcus aureus: NEGATIVE

## 2019-12-07 NOTE — Progress Notes (Signed)
PCP Excell Seltzer, MD Cardiologist - pt denies   Chest x-ray - 01/07/19 EKG - n/a Stress Test - pt denies ECHO - 01/06/17 Cardiac Cath - pt denies   Blood Thinner Instructions: n/a Aspirin Instructions:n/a  ERAS Protcol - n/a PRE-SURGERY Ensure or G2- n/a  COVID TEST- 12/07/19  Coronavirus Screening  Have you experienced the following symptoms:  Cough yes/no: No Fever (>100.38F)  yes/no: No Runny nose yes/no: No Sore throat yes/no: No Difficulty breathing/shortness of breath  yes/no: No  Have you or a family member traveled in the last 14 days and where? yes/no: No   If the patient indicates "YES" to the above questions, their PAT will be rescheduled to limit the exposure to others and, the surgeon will be notified. THE PATIENT WILL NEED TO BE ASYMPTOMATIC FOR 14 DAYS.   If the patient is not experiencing any of these symptoms, the PAT nurse will instruct them to NOT bring anyone with them to their appointment since they may have these symptoms or traveled as well.   Please remind your patients and families that hospital visitation restrictions are in effect and the importance of the restrictions.     Anesthesia review: n/a  Patient denies shortness of breath, fever, cough and chest pain at PAT appointment   All instructions explained to the patient, with a verbal understanding of the material. Patient agrees to go over the instructions while at home for a better understanding. Patient also instructed to self quarantine after being tested for COVID-19. The opportunity to ask questions was provided.  \

## 2019-12-07 NOTE — Progress Notes (Signed)
Anesthesia Chart Review:  Case: [id number] Date/Time: 12/09/19 0955   Procedure: Removal of L4,L5 and S1 pedicle screws (N/A Back)   Anesthesia type: General   Pre-op diagnosis: Painful hardware   Location: [facility] ROOM 49 / [facility]   Surgeons: Earnie Larsson, MD      DISCUSSION: Patient is a 59 year old male scheduled for the above procedure.  History includes never smoker, arthritis, right TKA (06/2016), neck surgery (bilateral cervical laminectomy C3-4 02/26/17, ACDF 10/15/17), back surgery (bilateral thoracic laminectomy T10-11 with decompression and excision of ruptured disc 02/26/16; L4-5 PLIF/posterior lateral arthrodesis 12/28/18).  He denied chest pain, shortness of breath, fever, cough at PAT RN visit.  Presurgical COVID test from 12/07/19 is still in process.  Anesthesia team to evaluate on the day of surgery.    VS: BP 130/90   Pulse 81   Temp 37.2 C (Oral)   Resp 18   Ht 5\' 10"  (1.778 m)   Wt 91.4 kg   SpO2 97%   BMI 28.93 kg/m    PROVIDERS: Guadalupe Maple, MD is PCP - He is not routinely followed by cardiologist, but he was evaluated by Helene Kelp, MD with [facility] in 05/2016 for preoperative evaluation prior to knee surgery given some dyspnea with moderate activity, but no chest pain. EKG was normal.  Echo showed normal LVEF with no significant valvular abnormalities. He was felt low risk for his knee surgery.    LABS: Labs reviewed: Acceptable for surgery. (all labs ordered are listed, but only abnormal results are displayed)  Labs Reviewed  BASIC METABOLIC PANEL - Abnormal; Notable for the following components:      Result Value   CO2 19 (*)    All other components within normal limits  SURGICAL PCR SCREEN  CBC WITH DIFFERENTIAL/PLATELET     IMAGES: MRI Abd 11/14/19 ([facility]): IMPRESSION:   -MRI findings consistent with simple hepatic cyst and simple left renal cyst. No suspicious enhancement.  -Duodenal mucosal enhancement and mild surrounding  edema consistent with duodenitis. If symptoms do not improve, consider endoscopy to exclude duodenal ulcer.  - Seen at [facility] with [facility] on 11/21/19 and started on daily PPI with plans for future EGD.   1V CXR 01/07/19: FINDINGS: The lungs are clear. There is no pleural effusion or pneumothorax. Top-normal cardiac silhouette. No acute osseous pathology. Lower cervical fixation hardware. Degenerative changes of the shoulders and widening of the right AC joint. IMPRESSION: No active disease.   EKG: N/A (05/29/16 from [facility] showed NSR; scanned under Media tab, Correspondence, 12/24/18)   CV: Echo 06/05/16 [facility], scanned under Media tab, Correspondence, 12/24/18): Summary: The study was technically adequate. There is normal LV wall thickness.  The LV is normal in size.Left ventricular systolic function is normal.  Ejection fraction is > 55%.  The transmitral spectral Doppler flow pattern is suggestive of impaired LV relaxation.  No regional wall motion abnormalities noted.  The right ventricle is normal in size and function.  Interatrial septum is intact with no evidence of atrial septal defect.  There is trace mitral regurgitation.  There is trace tricuspid regurgitation.  Right ventricular systolic pressure is normal.  There is trace pulmonic regurgitation.  The aortic root is normal size.   Past Medical History:  Diagnosis Date  . Arthritis   . History of kidney stones    history of previous kidney stones    Past Surgical History:  Procedure Laterality Date  . ANTERIOR CERVICAL DECOMP/DISCECTOMY FUSION  10/15/2017   Dr. Jordan Likes  . JOINT REPLACEMENT    . left TKR Left 06/2016   Dr. Charlann Boxer  . posterior cervical disc fusion  02/26/2017  . THORACIC DISC SURGERY     2017, 2 ruptured discs, had emergency surgery    MEDICATIONS: . Calcium-Magnesium-Zinc (CAL-MAG-ZINC PO)  . cholecalciferol (VITAMIN D3) 25 MCG (1000 UT) tablet  .  diazepam (VALIUM) 5 MG tablet  . docusate sodium (COLACE) 100 MG capsule  . meloxicam (MOBIC) 15 MG tablet  . Misc Natural Products (GLUCOSAMINE CHOND MSM FORMULA PO)  . Multiple Vitamins-Minerals (MULTIVITAMIN WITH MINERALS) tablet  . Omega-3 Fatty Acids (FISH OIL) 1200 MG CAPS  . omeprazole (PRILOSEC) 40 MG capsule  . oxyCODONE 10 MG TABS   No current facility-administered medications for this encounter.    Shonna Chock, PA-C Surgical Short Stay/Anesthesiology [facility] Phone [phone] [facility] Phone [phone] 12/07/2019 6:51 PM

## 2019-12-09 ENCOUNTER — Other Ambulatory Visit: Payer: Self-pay

## 2019-12-09 ENCOUNTER — Encounter (HOSPITAL_COMMUNITY)
Admission: RE | Disposition: A | Discharge status: Home / Self Care | Payer: Self-pay | Source: Home / Self Care | Attending: Neurosurgery

## 2019-12-09 ENCOUNTER — Inpatient Hospital Stay (HOSPITAL_COMMUNITY)
Admission: RE | Admit: 2019-12-09 | Discharge: 2019-12-09 | DRG: 497 | Disposition: A | Discharge status: Home / Self Care | Payer: BLUE CROSS/BLUE SHIELD | Attending: Neurosurgery | Admitting: Neurosurgery

## 2019-12-09 ENCOUNTER — Inpatient Hospital Stay (HOSPITAL_COMMUNITY): Payer: BLUE CROSS/BLUE SHIELD | Admitting: Anesthesiology

## 2019-12-09 ENCOUNTER — Encounter (HOSPITAL_COMMUNITY): Payer: Self-pay | Admitting: Neurosurgery

## 2019-12-09 ENCOUNTER — Inpatient Hospital Stay (HOSPITAL_COMMUNITY): Payer: BLUE CROSS/BLUE SHIELD | Admitting: Vascular Surgery

## 2019-12-09 DIAGNOSIS — T8484XA Pain due to internal orthopedic prosthetic devices, implants and grafts, initial encounter: Secondary | ICD-10-CM | POA: Diagnosis present

## 2019-12-09 DIAGNOSIS — K219 Gastro-esophageal reflux disease without esophagitis: Secondary | ICD-10-CM | POA: Diagnosis present

## 2019-12-09 DIAGNOSIS — Z791 Long term (current) use of non-steroidal anti-inflammatories (NSAID): Secondary | ICD-10-CM

## 2019-12-09 DIAGNOSIS — Y831 Surgical operation with implant of artificial internal device as the cause of abnormal reaction of the patient, or of later complication, without mention of misadventure at the time of the procedure: Secondary | ICD-10-CM | POA: Diagnosis present

## 2019-12-09 DIAGNOSIS — Z87442 Personal history of urinary calculi: Secondary | ICD-10-CM | POA: Diagnosis not present

## 2019-12-09 DIAGNOSIS — Z79899 Other long term (current) drug therapy: Secondary | ICD-10-CM | POA: Diagnosis not present

## 2019-12-09 DIAGNOSIS — Z981 Arthrodesis status: Secondary | ICD-10-CM

## 2019-12-09 DIAGNOSIS — Z20822 Contact with and (suspected) exposure to covid-19: Secondary | ICD-10-CM | POA: Diagnosis present

## 2019-12-09 DIAGNOSIS — Z96652 Presence of left artificial knee joint: Secondary | ICD-10-CM | POA: Diagnosis present

## 2019-12-09 HISTORY — PX: HARDWARE REMOVAL: SHX979

## 2019-12-09 SURGERY — REMOVAL, HARDWARE
Anesthesia: General | Site: Back

## 2019-12-09 MED ORDER — OXYCODONE HCL 5 MG PO TABS
10.0000 mg | ORAL_TABLET | Freq: Every day | ORAL | Status: DC
  Filled 2019-12-09: qty 2

## 2019-12-09 MED ORDER — CHLORHEXIDINE GLUCONATE 0.12 % MT SOLN
OROMUCOSAL | Status: AC
  Administered 2019-12-09: 15 mL via OROMUCOSAL
  Filled 2019-12-09: qty 15

## 2019-12-09 MED ORDER — KETOROLAC TROMETHAMINE 15 MG/ML IJ SOLN
30.0000 mg | Freq: Four times a day (QID) | INTRAMUSCULAR | Status: DC
  Administered 2019-12-09 (×2): 30 mg via INTRAVENOUS
  Filled 2019-12-09: qty 2

## 2019-12-09 MED ORDER — ONDANSETRON HCL 4 MG/2ML IJ SOLN: 4.0000 mg | Freq: Four times a day (QID) | INTRAMUSCULAR | Status: DC | PRN

## 2019-12-09 MED ORDER — ACETAMINOPHEN 650 MG RE SUPP: 650.0000 mg | RECTAL | Status: DC | PRN

## 2019-12-09 MED ORDER — OXYCODONE HCL 5 MG PO TABS: 5.0000 mg | ORAL_TABLET | Freq: Once | ORAL | Status: DC | PRN

## 2019-12-09 MED ORDER — LACTATED RINGERS IV SOLN: INTRAVENOUS | Status: DC

## 2019-12-09 MED ORDER — ONDANSETRON HCL 4 MG/2ML IJ SOLN
INTRAMUSCULAR | Status: AC
  Filled 2019-12-09: qty 2

## 2019-12-09 MED ORDER — GLUCOSAMINE CHOND MSM FORMULA PO TABS: ORAL_TABLET | Freq: Every day | ORAL | Status: DC

## 2019-12-09 MED ORDER — ACETAMINOPHEN 160 MG/5ML PO SOLN: 325.0000 mg | Freq: Once | ORAL | Status: DC | PRN

## 2019-12-09 MED ORDER — ROCURONIUM BROMIDE 10 MG/ML (PF) SYRINGE
PREFILLED_SYRINGE | INTRAVENOUS | Status: AC
  Filled 2019-12-09: qty 10

## 2019-12-09 MED ORDER — HYDROMORPHONE HCL 1 MG/ML IJ SOLN
INTRAMUSCULAR | Status: AC
  Filled 2019-12-09: qty 1

## 2019-12-09 MED ORDER — DEXAMETHASONE SODIUM PHOSPHATE 10 MG/ML IJ SOLN
INTRAMUSCULAR | Status: AC
  Filled 2019-12-09: qty 1

## 2019-12-09 MED ORDER — EPHEDRINE SULFATE-NACL 50-0.9 MG/10ML-% IV SOSY
PREFILLED_SYRINGE | INTRAVENOUS | Status: DC | PRN
  Administered 2019-12-09: 10 mg via INTRAVENOUS

## 2019-12-09 MED ORDER — DEXAMETHASONE SODIUM PHOSPHATE 10 MG/ML IJ SOLN
10.0000 mg | Freq: Once | INTRAMUSCULAR | Status: AC
  Administered 2019-12-09: 10 mg via INTRAVENOUS

## 2019-12-09 MED ORDER — ACETAMINOPHEN 10 MG/ML IV SOLN
1000.0000 mg | Freq: Once | INTRAVENOUS | Status: DC | PRN
  Administered 2019-12-09: 1000 mg via INTRAVENOUS

## 2019-12-09 MED ORDER — CHLORHEXIDINE GLUCONATE 0.12 % MT SOLN: 15.0000 mL | Freq: Once | OROMUCOSAL | Status: AC

## 2019-12-09 MED ORDER — FENTANYL CITRATE (PF) 250 MCG/5ML IJ SOLN
INTRAMUSCULAR | Status: AC
  Filled 2019-12-09: qty 5

## 2019-12-09 MED ORDER — CYCLOBENZAPRINE HCL 10 MG PO TABS
10.0000 mg | ORAL_TABLET | Freq: Three times a day (TID) | ORAL | Status: DC | PRN
  Administered 2019-12-09: 10 mg via ORAL
  Filled 2019-12-09: qty 1

## 2019-12-09 MED ORDER — BUPIVACAINE HCL (PF) 0.25 % IJ SOLN
INTRAMUSCULAR | Status: AC
  Filled 2019-12-09: qty 30

## 2019-12-09 MED ORDER — PROPOFOL 10 MG/ML IV BOLUS
INTRAVENOUS | Status: AC
  Filled 2019-12-09: qty 20

## 2019-12-09 MED ORDER — PHENYLEPHRINE 40 MCG/ML (10ML) SYRINGE FOR IV PUSH (FOR BLOOD PRESSURE SUPPORT)
PREFILLED_SYRINGE | INTRAVENOUS | Status: DC | PRN
  Administered 2019-12-09: 120 ug via INTRAVENOUS

## 2019-12-09 MED ORDER — CHLORHEXIDINE GLUCONATE CLOTH 2 % EX PADS: 6.0000 | MEDICATED_PAD | Freq: Once | CUTANEOUS | Status: DC

## 2019-12-09 MED ORDER — MIDAZOLAM HCL 5 MG/5ML IJ SOLN
INTRAMUSCULAR | Status: DC | PRN
  Administered 2019-12-09: 2 mg via INTRAVENOUS

## 2019-12-09 MED ORDER — LIDOCAINE 2% (20 MG/ML) 5 ML SYRINGE
INTRAMUSCULAR | Status: AC
  Filled 2019-12-09: qty 5

## 2019-12-09 MED ORDER — SODIUM CHLORIDE 0.9 % IV SOLN: 250.0000 mL | INTRAVENOUS | Status: DC

## 2019-12-09 MED ORDER — HYDROCODONE-ACETAMINOPHEN 10-325 MG PO TABS: 1.0000 | ORAL_TABLET | ORAL | Status: DC | PRN

## 2019-12-09 MED ORDER — VITAMIN D 25 MCG (1000 UNIT) PO TABS
1000.0000 [IU] | ORAL_TABLET | Freq: Every day | ORAL | Status: DC
  Administered 2019-12-09: 1000 [IU] via ORAL
  Filled 2019-12-09: qty 1

## 2019-12-09 MED ORDER — MIDAZOLAM HCL 2 MG/2ML IJ SOLN
INTRAMUSCULAR | Status: AC
  Filled 2019-12-09: qty 2

## 2019-12-09 MED ORDER — CEFAZOLIN SODIUM-DEXTROSE 2-4 GM/100ML-% IV SOLN
2.0000 g | INTRAVENOUS | Status: AC
  Administered 2019-12-09: 2 g via INTRAVENOUS

## 2019-12-09 MED ORDER — HYDROMORPHONE HCL 1 MG/ML IJ SOLN
0.2500 mg | INTRAMUSCULAR | Status: DC | PRN
  Administered 2019-12-09 (×2): 0.5 mg via INTRAVENOUS

## 2019-12-09 MED ORDER — ALBUMIN HUMAN 5 % IV SOLN: INTRAVENOUS | Status: DC | PRN

## 2019-12-09 MED ORDER — THROMBIN 5000 UNITS EX SOLR
CUTANEOUS | Status: AC
  Filled 2019-12-09: qty 15000

## 2019-12-09 MED ORDER — PROPOFOL 10 MG/ML IV BOLUS
INTRAVENOUS | Status: DC | PRN
  Administered 2019-12-09: 150 mg via INTRAVENOUS

## 2019-12-09 MED ORDER — KETAMINE HCL 50 MG/5ML IJ SOSY
PREFILLED_SYRINGE | INTRAMUSCULAR | Status: AC
  Filled 2019-12-09: qty 5

## 2019-12-09 MED ORDER — SODIUM CHLORIDE 0.9% FLUSH: 3.0000 mL | INTRAVENOUS | Status: DC | PRN

## 2019-12-09 MED ORDER — DOCUSATE SODIUM 100 MG PO CAPS
100.0000 mg | ORAL_CAPSULE | Freq: Every day | ORAL | Status: DC
  Administered 2019-12-09: 100 mg via ORAL
  Filled 2019-12-09: qty 1

## 2019-12-09 MED ORDER — HYDROMORPHONE HCL 1 MG/ML IJ SOLN: 1.0000 mg | INTRAMUSCULAR | Status: DC | PRN

## 2019-12-09 MED ORDER — ADULT MULTIVITAMIN W/MINERALS CH
1.0000 | ORAL_TABLET | Freq: Every day | ORAL | Status: DC
  Administered 2019-12-09: 1 via ORAL
  Filled 2019-12-09 (×2): qty 1

## 2019-12-09 MED ORDER — PHENOL 1.4 % MT LIQD: 1.0000 | OROMUCOSAL | Status: DC | PRN

## 2019-12-09 MED ORDER — BUPIVACAINE HCL (PF) 0.25 % IJ SOLN
INTRAMUSCULAR | Status: DC | PRN
  Administered 2019-12-09: 20 mL

## 2019-12-09 MED ORDER — DIAZEPAM 5 MG PO TABS: 5.0000 mg | ORAL_TABLET | Freq: Four times a day (QID) | ORAL | Status: DC | PRN

## 2019-12-09 MED ORDER — PROMETHAZINE HCL 25 MG/ML IJ SOLN: 6.2500 mg | INTRAMUSCULAR | Status: DC | PRN

## 2019-12-09 MED ORDER — ROCURONIUM BROMIDE 10 MG/ML (PF) SYRINGE
PREFILLED_SYRINGE | INTRAVENOUS | Status: DC | PRN
  Administered 2019-12-09: 50 mg via INTRAVENOUS

## 2019-12-09 MED ORDER — CEFAZOLIN SODIUM-DEXTROSE 1-4 GM/50ML-% IV SOLN
1.0000 g | Freq: Three times a day (TID) | INTRAVENOUS | Status: DC
  Administered 2019-12-09: 1 g via INTRAVENOUS
  Filled 2019-12-09: qty 50

## 2019-12-09 MED ORDER — CEFAZOLIN SODIUM-DEXTROSE 2-4 GM/100ML-% IV SOLN
INTRAVENOUS | Status: AC
  Filled 2019-12-09: qty 100

## 2019-12-09 MED ORDER — SODIUM CHLORIDE 0.9 % IV SOLN
INTRAVENOUS | Status: DC | PRN
  Administered 2019-12-09: 500 mL

## 2019-12-09 MED ORDER — ACETAMINOPHEN 325 MG PO TABS: 650.0000 mg | ORAL_TABLET | ORAL | Status: DC | PRN

## 2019-12-09 MED ORDER — HEMOSTATIC AGENTS (NO CHARGE) OPTIME
TOPICAL | Status: DC | PRN
Start: 2019-12-09 — End: 2019-12-09
  Administered 2019-12-09: 1 via TOPICAL

## 2019-12-09 MED ORDER — SUGAMMADEX SODIUM 200 MG/2ML IV SOLN
INTRAVENOUS | Status: DC | PRN
  Administered 2019-12-09: 200 mg via INTRAVENOUS

## 2019-12-09 MED ORDER — KETAMINE HCL 10 MG/ML IJ SOLN
INTRAMUSCULAR | Status: DC | PRN
Start: 2019-12-09 — End: 2019-12-09
  Administered 2019-12-09: 40 mg via INTRAVENOUS

## 2019-12-09 MED ORDER — THROMBIN 5000 UNITS EX SOLR
CUTANEOUS | Status: DC | PRN
  Administered 2019-12-09: 10000 [IU] via TOPICAL

## 2019-12-09 MED ORDER — OMEGA-3-ACID ETHYL ESTERS 1 G PO CAPS
1.0000 g | ORAL_CAPSULE | Freq: Two times a day (BID) | ORAL | Status: DC
  Administered 2019-12-09: 1 g via ORAL
  Filled 2019-12-09 (×2): qty 1

## 2019-12-09 MED ORDER — OXYCODONE HCL 5 MG/5ML PO SOLN: 5.0000 mg | Freq: Once | ORAL | Status: DC | PRN

## 2019-12-09 MED ORDER — MELOXICAM 7.5 MG PO TABS: 15.0000 mg | ORAL_TABLET | Freq: Every day | ORAL | Status: DC

## 2019-12-09 MED ORDER — FENTANYL CITRATE (PF) 100 MCG/2ML IJ SOLN
INTRAMUSCULAR | Status: DC | PRN
  Administered 2019-12-09: 50 ug via INTRAVENOUS
  Administered 2019-12-09: 100 ug via INTRAVENOUS
  Administered 2019-12-09: 50 ug via INTRAVENOUS

## 2019-12-09 MED ORDER — THROMBIN 5000 UNITS EX SOLR
OROMUCOSAL | Status: DC | PRN
  Administered 2019-12-09: 5 mL via TOPICAL

## 2019-12-09 MED ORDER — PHENYLEPHRINE 40 MCG/ML (10ML) SYRINGE FOR IV PUSH (FOR BLOOD PRESSURE SUPPORT)
PREFILLED_SYRINGE | INTRAVENOUS | Status: AC
  Filled 2019-12-09: qty 10

## 2019-12-09 MED ORDER — OXYCODONE HCL 10 MG PO TABS: 10.0000 mg | ORAL_TABLET | Freq: Four times a day (QID) | ORAL | 0 refills | Status: DC | PRN

## 2019-12-09 MED ORDER — LIDOCAINE 2% (20 MG/ML) 5 ML SYRINGE
INTRAMUSCULAR | Status: DC | PRN
  Administered 2019-12-09: 50 mg via INTRAVENOUS

## 2019-12-09 MED ORDER — ONDANSETRON HCL 4 MG/2ML IJ SOLN
INTRAMUSCULAR | Status: DC | PRN
  Administered 2019-12-09: 4 mg via INTRAVENOUS

## 2019-12-09 MED ORDER — EPHEDRINE 5 MG/ML INJ
INTRAVENOUS | Status: AC
  Filled 2019-12-09: qty 10

## 2019-12-09 MED ORDER — 0.9 % SODIUM CHLORIDE (POUR BTL) OPTIME
TOPICAL | Status: DC | PRN
  Administered 2019-12-09: 1000 mL

## 2019-12-09 MED ORDER — MEPERIDINE HCL 25 MG/ML IJ SOLN: 6.2500 mg | INTRAMUSCULAR | Status: DC | PRN

## 2019-12-09 MED ORDER — MENTHOL 3 MG MT LOZG: 1.0000 | LOZENGE | OROMUCOSAL | Status: DC | PRN

## 2019-12-09 MED ORDER — ONDANSETRON HCL 4 MG PO TABS: 4.0000 mg | ORAL_TABLET | Freq: Four times a day (QID) | ORAL | Status: DC | PRN

## 2019-12-09 MED ORDER — ORAL CARE MOUTH RINSE: 15.0000 mL | Freq: Once | OROMUCOSAL | Status: AC

## 2019-12-09 MED ORDER — HYDROCODONE-ACETAMINOPHEN 5-325 MG PO TABS: 1.0000 | ORAL_TABLET | ORAL | Status: DC | PRN

## 2019-12-09 MED ORDER — SODIUM CHLORIDE 0.9% FLUSH: 3.0000 mL | Freq: Two times a day (BID) | INTRAVENOUS | Status: DC

## 2019-12-09 MED ORDER — ACETAMINOPHEN 10 MG/ML IV SOLN
INTRAVENOUS | Status: AC
  Filled 2019-12-09: qty 100

## 2019-12-09 MED ORDER — PANTOPRAZOLE SODIUM 40 MG PO TBEC
40.0000 mg | DELAYED_RELEASE_TABLET | Freq: Every day | ORAL | Status: DC
  Administered 2019-12-09: 40 mg via ORAL
  Filled 2019-12-09: qty 1

## 2019-12-09 MED ORDER — ACETAMINOPHEN 325 MG PO TABS: 325.0000 mg | ORAL_TABLET | Freq: Once | ORAL | Status: DC | PRN

## 2019-12-09 SURGICAL SUPPLY — 50 items
BAG DECANTER FOR FLEXI CONT (MISCELLANEOUS) ×3 IMPLANT
BAND RUBBER #18 3X1/16 STRL (MISCELLANEOUS) ×6 IMPLANT
BENZOIN TINCTURE PRP APPL 2/3 (GAUZE/BANDAGES/DRESSINGS) ×3 IMPLANT
BLADE CLIPPER SURG (BLADE) IMPLANT
BUR CUTTER 7.0 ROUND (BURR) ×3 IMPLANT
CANISTER SUCT 3000ML PPV (MISCELLANEOUS) ×3 IMPLANT
CARTRIDGE OIL MAESTRO DRILL (MISCELLANEOUS) ×1 IMPLANT
CLOSURE WOUND 1/2 X4 (GAUZE/BANDAGES/DRESSINGS) ×1
COVER WAND RF STERILE (DRAPES) IMPLANT
DECANTER SPIKE VIAL GLASS SM (MISCELLANEOUS) ×3 IMPLANT
DERMABOND ADVANCED (GAUZE/BANDAGES/DRESSINGS) ×2
DERMABOND ADVANCED .7 DNX12 (GAUZE/BANDAGES/DRESSINGS) ×1 IMPLANT
DIFFUSER DRILL AIR PNEUMATIC (MISCELLANEOUS) ×3 IMPLANT
DRAPE HALF SHEET 40X57 (DRAPES) IMPLANT
DRAPE LAPAROTOMY 100X72X124 (DRAPES) ×3 IMPLANT
DRAPE MICROSCOPE LEICA (MISCELLANEOUS) IMPLANT
DRAPE SURG 17X23 STRL (DRAPES) ×6 IMPLANT
DRSG OPSITE POSTOP 3X4 (GAUZE/BANDAGES/DRESSINGS) ×6 IMPLANT
ELECT REM PT RETURN 9FT ADLT (ELECTROSURGICAL) ×3
ELECTRODE REM PT RTRN 9FT ADLT (ELECTROSURGICAL) ×1 IMPLANT
GAUZE 4X4 16PLY RFD (DISPOSABLE) IMPLANT
GAUZE SPONGE 4X4 12PLY STRL (GAUZE/BANDAGES/DRESSINGS) ×3 IMPLANT
GLOVE BIO SURGEON STRL SZ 6.5 (GLOVE) ×2 IMPLANT
GLOVE BIO SURGEONS STRL SZ 6.5 (GLOVE) ×1
GLOVE BIOGEL PI IND STRL 6.5 (GLOVE) ×1 IMPLANT
GLOVE BIOGEL PI INDICATOR 6.5 (GLOVE) ×2
GLOVE ECLIPSE 9.0 STRL (GLOVE) ×3 IMPLANT
GLOVE EXAM NITRILE XL STR (GLOVE) IMPLANT
GOWN STRL REUS W/ TWL LRG LVL3 (GOWN DISPOSABLE) IMPLANT
GOWN STRL REUS W/ TWL XL LVL3 (GOWN DISPOSABLE) ×1 IMPLANT
GOWN STRL REUS W/TWL 2XL LVL3 (GOWN DISPOSABLE) IMPLANT
GOWN STRL REUS W/TWL LRG LVL3 (GOWN DISPOSABLE)
GOWN STRL REUS W/TWL XL LVL3 (GOWN DISPOSABLE) ×3
HEMOSTAT POWDER KIT SURGIFOAM (HEMOSTASIS) ×3 IMPLANT
KIT BASIN OR (CUSTOM PROCEDURE TRAY) ×3 IMPLANT
KIT TURNOVER KIT B (KITS) ×3 IMPLANT
NEEDLE HYPO 22GX1.5 SAFETY (NEEDLE) ×3 IMPLANT
NEEDLE SPNL 22GX3.5 QUINCKE BK (NEEDLE) ×3 IMPLANT
NS IRRIG 1000ML POUR BTL (IV SOLUTION) ×3 IMPLANT
OIL CARTRIDGE MAESTRO DRILL (MISCELLANEOUS) ×3
PACK LAMINECTOMY NEURO (CUSTOM PROCEDURE TRAY) ×3 IMPLANT
PAD ARMBOARD 7.5X6 YLW CONV (MISCELLANEOUS) ×9 IMPLANT
SPONGE SURGIFOAM ABS GEL 100 (HEMOSTASIS) IMPLANT
SPONGE SURGIFOAM ABS GEL SZ50 (HEMOSTASIS) ×3 IMPLANT
STRIP CLOSURE SKIN 1/2X4 (GAUZE/BANDAGES/DRESSINGS) ×2 IMPLANT
SUT VIC AB 2-0 CT1 18 (SUTURE) ×3 IMPLANT
SUT VIC AB 3-0 SH 8-18 (SUTURE) ×6 IMPLANT
TOWEL GREEN STERILE (TOWEL DISPOSABLE) ×3 IMPLANT
TOWEL GREEN STERILE FF (TOWEL DISPOSABLE) ×3 IMPLANT
WATER STERILE IRR 1000ML POUR (IV SOLUTION) ×3 IMPLANT

## 2019-12-09 NOTE — Anesthesia Preprocedure Evaluation (Addendum)
Anesthesia Evaluation  Patient identified by MRN, date of birth, ID band Patient awake    Reviewed: Allergy & Precautions, NPO status , Patient's Chart, lab work & pertinent test results  Airway Mallampati: II  TM Distance: >3 FB Neck ROM: Full    Dental  (+) Teeth Intact, Missing, Dental Advisory Given,    Pulmonary neg pulmonary ROS,    breath sounds clear to auscultation       Cardiovascular  Rhythm:Regular     Neuro/Psych negative neurological ROS  negative psych ROS   GI/Hepatic Neg liver ROS, GERD  Medicated,  Endo/Other  negative endocrine ROS  Renal/GU negative Renal ROS     Musculoskeletal  (+) Arthritis ,   Abdominal Normal abdominal exam  (+)   Peds  Hematology   Anesthesia Other Findings   Reproductive/Obstetrics                          Anesthesia Physical Anesthesia Plan  ASA: II  Anesthesia Plan: General   Post-op Pain Management:    Induction: Intravenous  PONV Risk Score and Plan: 3 and Ondansetron, Dexamethasone and Midazolam  Airway Management Planned: Oral ETT  Additional Equipment: None  Intra-op Plan:   Post-operative Plan: Extubation in OR  Informed Consent: I have reviewed the patients History and Physical, chart, labs and discussed the procedure including the risks, benefits and alternatives for the proposed anesthesia with the patient or authorized representative who has indicated his/her understanding and acceptance.     Dental advisory given  Plan Discussed with: CRNA  Anesthesia Plan Comments:         Anesthesia Quick Evaluation

## 2019-12-09 NOTE — Anesthesia Procedure Notes (Signed)
Procedure Name: Intubation Date/Time: 12/09/2019 10:29 AM Performed by: Lovie Chol, CRNA Pre-anesthesia Checklist: Patient identified, Emergency Drugs available, Suction available and Patient being monitored Patient Re-evaluated:Patient Re-evaluated prior to induction Oxygen Delivery Method: Circle System Utilized Preoxygenation: Pre-oxygenation with 100% oxygen Induction Type: IV induction Ventilation: Mask ventilation without difficulty Laryngoscope Size: Miller and 3 Grade View: Grade I Tube type: Oral Tube size: 7.5 mm Number of attempts: 1 Airway Equipment and Method: Stylet and Oral airway Placement Confirmation: ETT inserted through vocal cords under direct vision,  positive ETCO2 and breath sounds checked- equal and bilateral Secured at: 21 cm Tube secured with: Tape Dental Injury: Teeth and Oropharynx as per pre-operative assessment

## 2019-12-09 NOTE — Transfer of Care (Signed)
Immediate Anesthesia Transfer of Care Note  Patient: Chase Gonzalez  Procedure(s) Performed: Removal of Lumbar Four, Lumbar Five and Sacral One pedicle screws (N/A Back)  Patient Location: PACU  Anesthesia Type:General  Level of Consciousness: oriented, drowsy and patient cooperative  Airway & Oxygen Therapy: Patient Spontanous Breathing and Patient connected to nasal cannula oxygen  Post-op Assessment: Report given to RN and Post -op Vital signs reviewed and stable  Post vital signs: Reviewed  Last Vitals:  Vitals Value Taken Time  BP 145/79 12/09/19 1150  Temp    Pulse 80 12/09/19 1153  Resp 12 12/09/19 1153  SpO2 100 % 12/09/19 1153  Vitals shown include unvalidated device data.  Last Pain:  Vitals:   12/09/19 0844  TempSrc:   PainSc: 1       Patients Stated Pain Goal: 3 (12/09/19 0844)  Complications: No complications documented.

## 2019-12-09 NOTE — Brief Op Note (Signed)
12/09/2019  11:31 AM  PATIENT:  Chase Gonzalez  59 y.o. male  PRE-OPERATIVE DIAGNOSIS:  Painful hardware  POST-OPERATIVE DIAGNOSIS:  Painful hardware  PROCEDURE:  Procedure(s) with comments: Removal of Lumbar Four, Lumbar Five and Sacral One pedicle screws (N/A) - Removal of Lumbar Four, Lumbar Five and Sacral One pedicle screws  SURGEON:  Surgeon(s) and Role:    Julio Sicks, MD - Primary  PHYSICIAN ASSISTANT:   ASSISTANTS: Bergman<NP   ANESTHESIA:   general  EBL:  10 mL   BLOOD ADMINISTERED:none  DRAINS: none   LOCAL MEDICATIONS USED:  MARCAINE     SPECIMEN:  No Specimen  DISPOSITION OF SPECIMEN:  N/A  COUNTS:  YES  TOURNIQUET:  * No tourniquets in log *  DICTATION: .Dragon Dictation  PLAN OF CARE: Admit for overnight observation  PATIENT DISPOSITION:  PACU - hemodynamically stable.   Delay start of Pharmacological VTE agent (>24hrs) due to surgical blood loss or risk of bleeding: yes

## 2019-12-09 NOTE — Discharge Summary (Signed)
Physician Discharge Summary  Patient ID: Chase Gonzalez MRN: [id number] DOB/AGE: Nov 30, 1960 59 y.o.  Admit date: 12/09/2019 Discharge date: 12/09/2019  Admission Diagnoses:  Discharge Diagnoses:  Active Problems:   Painful orthopaedic hardware [facility])   Discharged Condition: good  Hospital Course: Patient mated to the hospital where he underwent uncomplicated reexploration of his lumbar fusion with removal of painful hardware.  Postoperatively doing well.  Back pain improved.  He is ambulating without difficulty.  He is voided.  He is ready for discharge home.  Consults:   Significant Diagnostic Studies:   Treatments:   Discharge Exam: Blood pressure 121/83, pulse 60, temperature 97.6 F (36.4 C), temperature source Oral, resp. rate 20, height 5\' 10"  (1.778 m), weight 91.2 kg, SpO2 98 %. Awake and alert.  Oriented and appropriate.  Motor and sensory function stable.  Wound clean and dry.  Chest and abdomen benign.  Disposition: Discharge disposition: 01-Home or Self Care        Allergies as of 12/09/2019   No Known Allergies     Medication List    TAKE these medications   acetaminophen 500 MG tablet Commonly known as: TYLENOL Take 500 mg by mouth every 6 (six) hours as needed for moderate pain.   CAL-MAG-ZINC PO Take 1 tablet by mouth daily.   cholecalciferol 25 MCG (1000 UNIT) tablet Commonly known as: VITAMIN D3 Take 1,000 Units by mouth daily.   diazepam 5 MG tablet Commonly known as: VALIUM Take 1-2 tablets (5-10 mg total) by mouth every 6 (six) hours as needed for muscle spasms. What changed: how much to take   docusate sodium 100 MG capsule Commonly known as: COLACE Take 100 mg by mouth daily.   Fish Oil 1200 MG Caps Take 2,400 mg by mouth daily.   GLUCOSAMINE CHOND MSM FORMULA PO Take 1 tablet by mouth daily.   meloxicam 15 MG tablet Commonly known as: MOBIC Take 15 mg by mouth daily.   multivitamin with minerals tablet Take 1 tablet by  mouth daily. Mens   omeprazole 40 MG capsule Commonly known as: PRILOSEC Take 40 mg by mouth daily.   Oxycodone HCl 10 MG Tabs Take 1 tablet (10 mg total) by mouth every 6 (six) hours as needed ((score 7 to 10)). What changed: reasons to take this        Signed: 12/11/2019 12/09/2019, 5:42 PM

## 2019-12-09 NOTE — H&P (Signed)
Chase Gonzalez is an 59 y.o. male.   Chief Complaint: Back pain HPI: 59 year old male status post prior L4-5 and L5-S1 decompression fusion presents with worsening back.  Work-up demonstrates evidence of some hardware loosening at L4-5 on the left side however the hardware appears intact on the right side at L4-5.  Fusion appears solid laterally.  There is some incomplete union at L4-5.  The main complicating feature daily.  The pedicle screws at L4 have reached the L3-4 facet joints and I believe because of his back pain.  Patient desires hardware removal.  Past Medical History:  Diagnosis Date  . Arthritis   . History of kidney stones    history of previous kidney stones    Past Surgical History:  Procedure Laterality Date  . ANTERIOR CERVICAL DECOMP/DISCECTOMY FUSION  10/15/2017   Dr. Jordan Likes  . JOINT REPLACEMENT    . left TKR Left 06/2016   Dr. Charlann Boxer  . posterior cervical disc fusion  02/26/2017  . THORACIC DISC SURGERY     2017, 2 ruptured discs, had emergency surgery    History reviewed. No pertinent family history. Social History:  reports that he has never smoked. His smokeless tobacco use includes chew. He reports previous alcohol use. He reports previous drug use.  Allergies: No Known Allergies  Medications Prior to Admission  Medication Sig Dispense Refill  . acetaminophen (TYLENOL) 500 MG tablet Take 500 mg by mouth every 6 (six) hours as needed for moderate pain.    . Calcium-Magnesium-Zinc (CAL-MAG-ZINC PO) Take 1 tablet by mouth daily.    . cholecalciferol (VITAMIN D3) 25 MCG (1000 UT) tablet Take 1,000 Units by mouth daily.    . diazepam (VALIUM) 5 MG tablet Take 1-2 tablets (5-10 mg total) by mouth every 6 (six) hours as needed for muscle spasms. (Patient taking differently: Take 5 mg by mouth every 6 (six) hours as needed for muscle spasms. ) 30 tablet 0  . docusate sodium (COLACE) 100 MG capsule Take 100 mg by mouth daily.     . meloxicam (MOBIC) 15 MG tablet Take  15 mg by mouth daily.    . Misc Natural Products (GLUCOSAMINE CHOND MSM FORMULA PO) Take 1 tablet by mouth daily.    . Multiple Vitamins-Minerals (MULTIVITAMIN WITH MINERALS) tablet Take 1 tablet by mouth daily. Mens    . Omega-3 Fatty Acids (FISH OIL) 1200 MG CAPS Take 2,400 mg by mouth daily.     Marland Kitchen omeprazole (PRILOSEC) 40 MG capsule Take 40 mg by mouth daily.    Marland Kitchen oxyCODONE 10 MG TABS Take 1 tablet (10 mg total) by mouth every 6 (six) hours as needed for severe pain ((score 7 to 10)). (Patient taking differently: Take 10 mg by mouth daily. ) 28 tablet 0    Results for orders placed or performed during the hospital encounter of 12/07/19 (from the past 48 hour(s))  SARS CORONAVIRUS 2 (TAT 6-24 HRS) Nasopharyngeal Nasopharyngeal Swab     Status: None   Collection Time: 12/07/19  1:59 PM   Specimen: Nasopharyngeal Swab  Result Value Ref Range   SARS Coronavirus 2 NEGATIVE NEGATIVE    Comment: (NOTE) SARS-CoV-2 target nucleic acids are NOT DETECTED.  The SARS-CoV-2 RNA is generally detectable in upper and lower respiratory specimens during the acute phase of infection. Negative results do not preclude SARS-CoV-2 infection, do not rule out co-infections with other pathogens, and should not be used as the sole basis for treatment or other patient management decisions. Negative results  must be combined with clinical observations, patient history, and epidemiological information. The expected result is Negative.  Fact Sheet for Patients: SugarRoll.be  Fact Sheet for Healthcare Providers: https://www.woods-mathews.com/  This test is not yet approved or cleared by the [place] FDA and  has been authorized for detection and/or diagnosis of SARS-CoV-2 by FDA under an Emergency Use Authorization (EUA). This EUA will remain  in effect (meaning this test can be used) for the duration of the COVID-19 declaration under Se ction 564(b)(1) of the Act,  21 U.S.C. section 360bbb-3(b)(1), unless the authorization is terminated or revoked sooner.  Performed at [facility], [place] [address] [place], [place] [address]    No results found.  Pertinent items noted in HPI and remainder of comprehensive ROS otherwise negative.  Blood pressure 127/84, pulse 64, temperature 98.4 F (36.9 C), temperature source Oral, resp. rate 17, height 5\' 10"  (1.778 m), weight 91.2 kg, SpO2 97 %.  Patient is awake and alert.  He is oriented and appropriate.  Speech is fluent.  Judgment insight intact.  Cranial nerve function normal bilateral.  Motor examination reveals increased motor tone in both lower extremities with significant spasticity.  He has some mild grip and intrinsic weakness in both hands.  His gait is unsteady and spastic.  He has a relative sensory level in his mid thoracic region.  Wound is well-healed.  Examination head ears eyes nose throat selective chest and abdomen are benign.  Extremities are free from injury deformity. Assessment/Plan Painful lumbar instrumentation.  Plan removal of L4-5 and S1 hardware.  Risks and benefits of been explained.  Patient wishes to proceed.  Mallie Mussel A Jarick Harkins 12/09/2019, 10:03 AM

## 2019-12-09 NOTE — Progress Notes (Signed)
Patient is discharged from room 3C05 at this time. Alert and in stable condition. IV site d/c'd and instructions read to patient with understanding verbalized and all questions answered. Left unit via wheelchair with all belongings at side.  

## 2019-12-09 NOTE — Op Note (Signed)
Date of procedure: 12/09/2019  Date of dictation: Same  Service: Neurosurgery  Preoperative diagnosis: Painful spinal hardware  Postoperative diagnosis: Same  Procedure Name: Removal of L4, L5, S1 pedicle screw instrumentation bilaterally  Surgeon:Strother Everitt A.Remberto Lienhard, M.D.  Asst. Surgeon: Doran Durand, NP  Anesthesia: General  Indication: 59 year old male with history of prior L4-S1 decompression and fusion.  Patient with loosened painful hardware.  He presents now for hardware removal.  Operative note: After induction anesthesia, patient edition prone padded.  Lumbar region prepped and draped sterilely.  Paramedian incision was then made overlying pedicle screw instrumentation starting first on the patient's left side.  Pedicle screw as patient was identified dissected free, disassembled and then removed.  The fusion was inspected and was not grossly unstable.  Procedure then repeated on the right side again without complication.  Wound was then irrigated and then closed in layers.  Steri-Strips and sterile dressing were applied.  No apparent complications.  Patient tolerated the procedure well and he returns to the recovery room postop.

## 2019-12-09 NOTE — Discharge Instructions (Signed)
Wound Care Keep incision covered and dry for two days.   Do not put any creams, lotions, or ointments on incision. Leave steri-strips on back.  They will fall off by themselves.  Activity Walk each and every day, increasing distance each day. No lifting greater than 5 lbs.  Avoid excessive neck motion. No driving for 2 weeks; may ride as a passenger locally.  Diet Resume your normal diet.   Return to Work Will be discussed at you follow up appointment.  Call Your Doctor If Any of These Occur Redness, drainage, or swelling at the wound.  Temperature greater than 101 degrees. Severe pain not relieved by pain medication. Incision starts to come apart. Follow Up Appt Call today for appointment in 1-2 weeks ([phone]) or for problems.  If you have any hardware placed in your spine, you will need an x-ray before your appointment.  

## 2019-12-12 ENCOUNTER — Encounter (HOSPITAL_COMMUNITY): Payer: Self-pay | Admitting: Neurosurgery

## 2019-12-12 NOTE — Anesthesia Postprocedure Evaluation (Signed)
Anesthesia Post Note  Patient: Chase Gonzalez  Procedure(s) Performed: Removal of Lumbar Four, Lumbar Five and Sacral One pedicle screws (N/A Back)     Patient location during evaluation: PACU Anesthesia Type: General Level of consciousness: awake and alert Pain management: pain level controlled Vital Signs Assessment: post-procedure vital signs reviewed and stable Respiratory status: spontaneous breathing, nonlabored ventilation, respiratory function stable and patient connected to nasal cannula oxygen Cardiovascular status: blood pressure returned to baseline and stable Postop Assessment: no apparent nausea or vomiting Anesthetic complications: no   No complications documented.  Last Vitals:  Vitals:   12/09/19 1354 12/09/19 1424  BP: 121/83 121/83  Pulse: (!) 59 60  Resp: 11 20  Temp: (!) 36.2 C 36.4 C  SpO2: 97% 98%    Last Pain:  Vitals:   12/12/19 1104  TempSrc:   PainSc: 8                  Shelton Silvas

## 2020-01-21 ENCOUNTER — Other Ambulatory Visit: Payer: Self-pay

## 2020-01-21 ENCOUNTER — Encounter (HOSPITAL_COMMUNITY): Payer: Self-pay

## 2020-01-21 ENCOUNTER — Emergency Department (HOSPITAL_COMMUNITY)
Admission: EM | Admit: 2020-01-21 | Discharge: 2020-01-22 | Disposition: A | Discharge status: Home / Self Care | Payer: BLUE CROSS/BLUE SHIELD | Attending: Emergency Medicine | Admitting: Emergency Medicine

## 2020-01-21 DIAGNOSIS — R52 Pain, unspecified: Secondary | ICD-10-CM | POA: Insufficient documentation

## 2020-01-21 DIAGNOSIS — Z5321 Procedure and treatment not carried out due to patient leaving prior to being seen by health care provider: Secondary | ICD-10-CM | POA: Insufficient documentation

## 2020-01-21 NOTE — ED Triage Notes (Signed)
Pt reports bilateral side pain >Right side pain, unable to walk without a cane starting Thursday afternoon. Pt reports surgery to L4 and L5 5 weeks ago. Dr. Jordan Likes removed some screws from surgical site

## 2020-01-22 NOTE — ED Notes (Signed)
Pt sated he did not want to wait and asked to be rolled outside

## 2020-01-25 ENCOUNTER — Other Ambulatory Visit: Payer: Self-pay | Admitting: Student

## 2020-01-25 ENCOUNTER — Other Ambulatory Visit (HOSPITAL_COMMUNITY): Payer: Self-pay | Admitting: Student

## 2020-01-25 DIAGNOSIS — M5416 Radiculopathy, lumbar region: Secondary | ICD-10-CM

## 2020-01-26 ENCOUNTER — Other Ambulatory Visit: Payer: Self-pay

## 2020-01-26 ENCOUNTER — Ambulatory Visit (HOSPITAL_COMMUNITY)
Admission: RE | Admit: 2020-01-26 | Discharge: 2020-01-26 | Disposition: A | Discharge status: Home / Self Care | Payer: BLUE CROSS/BLUE SHIELD | Source: Ambulatory Visit | Attending: Student | Admitting: Student

## 2020-01-26 DIAGNOSIS — M5416 Radiculopathy, lumbar region: Secondary | ICD-10-CM | POA: Diagnosis not present

## 2020-01-26 MED ORDER — GADOBUTROL 1 MMOL/ML IV SOLN
9.0000 mL | Freq: Once | INTRAVENOUS | Status: AC | PRN
  Administered 2020-01-26: 9 mL via INTRAVENOUS

## 2020-03-21 ENCOUNTER — Other Ambulatory Visit: Payer: Self-pay | Admitting: Neurosurgery

## 2020-03-21 DIAGNOSIS — Z981 Arthrodesis status: Secondary | ICD-10-CM

## 2020-03-23 ENCOUNTER — Other Ambulatory Visit: Payer: BLUE CROSS/BLUE SHIELD

## 2020-04-26 ENCOUNTER — Ambulatory Visit
Admission: RE | Admit: 2020-04-26 | Discharge: 2020-04-26 | Disposition: A | Discharge status: Home / Self Care | Payer: BLUE CROSS/BLUE SHIELD | Source: Ambulatory Visit | Attending: Neurosurgery | Admitting: Neurosurgery

## 2020-04-26 DIAGNOSIS — Z981 Arthrodesis status: Secondary | ICD-10-CM

## 2020-05-01 ENCOUNTER — Other Ambulatory Visit: Payer: Self-pay | Admitting: Neurosurgery

## 2020-05-01 ENCOUNTER — Other Ambulatory Visit (HOSPITAL_COMMUNITY): Payer: Self-pay | Admitting: Neurosurgery

## 2020-05-01 DIAGNOSIS — R102 Pelvic and perineal pain: Secondary | ICD-10-CM

## 2020-05-01 DIAGNOSIS — S32009K Unspecified fracture of unspecified lumbar vertebra, subsequent encounter for fracture with nonunion: Secondary | ICD-10-CM

## 2020-05-09 ENCOUNTER — Ambulatory Visit (HOSPITAL_COMMUNITY)
Admission: RE | Admit: 2020-05-09 | Discharge: 2020-05-09 | Disposition: A | Discharge status: Home / Self Care | Payer: BLUE CROSS/BLUE SHIELD | Source: Ambulatory Visit | Attending: Neurosurgery | Admitting: Neurosurgery

## 2020-05-09 ENCOUNTER — Other Ambulatory Visit: Payer: Self-pay

## 2020-05-09 ENCOUNTER — Ambulatory Visit (HOSPITAL_COMMUNITY): Payer: BLUE CROSS/BLUE SHIELD

## 2020-05-09 DIAGNOSIS — R102 Pelvic and perineal pain: Secondary | ICD-10-CM

## 2020-05-09 DIAGNOSIS — S32009K Unspecified fracture of unspecified lumbar vertebra, subsequent encounter for fracture with nonunion: Secondary | ICD-10-CM

## 2020-05-09 NOTE — Progress Notes (Signed)
[facility] - [place] - [address] [address]  [address] [place] [address] Phone: [phone] Fax: [phone]      Your procedure is scheduled on Tuesday 05/15/2020.  Report to [facility] Main Entrance "A" at 06:00 A.M., and check in at the Admitting office.  Call this number if you have problems the morning of surgery:  [phone]  Call [phone] if you have any questions prior to your surgery date Monday-Friday 8am-4pm    Remember:  Do not eat or drink after midnight the night before your surgery   Take these medicines the morning of surgery with A SIP OF WATER: Duloxetine (Cymbalta) Fluoxetine (Prozac) Omeprazole (Prilosec)  If NEEDED you may take the following medications the morning of surgery: Acetaminophen (Tylenol) Diazepam (Valium) Oxycodone HCl  As of today, STOP taking any Meloxicam (Mobic), Aspirin (unless otherwise instructed by your surgeon) Aleve, Naproxen, Ibuprofen, Motrin, Advil, Goody's, BC's, all herbal medications, fish oil, and all vitamins.                      Do not wear jewelry, make up, or nail polish            Do not wear lotions, powders, colognes, or deodorant.            Do not shave 48 hours prior to surgery.  Men may shave face and neck.            Do not bring valuables to the hospital.            [facility] is not responsible for any belongings or valuables.  Do NOT Smoke (Tobacco/Vaping) or drink Alcohol 24 hours prior to your procedure  If you use a CPAP at night, you may bring all equipment for your overnight stay.   Contacts, glasses, dentures or bridgework may not be worn into surgery.      For patients admitted to the hospital, discharge time will be determined by your treatment team.   Patients discharged the day of surgery will not be allowed to drive home, and someone needs to stay with them for 24 hours.    Special instructions:   [facility]- Preparing For Surgery  Before surgery, you can  play an important role. Because skin is not sterile, your skin needs to be as free of germs as possible. You can reduce the number of germs on your skin by washing with CHG (chlorahexidine gluconate) Soap before surgery.  CHG is an antiseptic cleaner which kills germs and bonds with the skin to continue killing germs even after washing.    Oral Hygiene is also important to reduce your risk of infection.  Remember - BRUSH YOUR TEETH THE MORNING OF SURGERY WITH YOUR REGULAR TOOTHPASTE  Please do not use if you have an allergy to CHG or antibacterial soaps. If your skin becomes reddened/irritated stop using the CHG.  Do not shave (including legs and underarms) for at least 48 hours prior to first CHG shower. It is OK to shave your face.  Please follow these instructions carefully.   1. Shower the NIGHT BEFORE SURGERY and the MORNING OF SURGERY with CHG Soap.   2. If you chose to wash your hair, wash your hair first as usual with your normal shampoo.  3. After you shampoo, rinse your hair and body thoroughly to remove the shampoo.  4. Use CHG as you would any other liquid soap. You  can apply CHG directly to the skin and wash gently with a scrungie or a clean washcloth.   5. Apply the CHG Soap to your body ONLY FROM THE NECK DOWN.  Do not use on open wounds or open sores. Avoid contact with your eyes, ears, mouth and genitals (private parts). Wash Face and genitals (private parts)  with your normal soap.   6. Wash thoroughly, paying special attention to the area where your surgery will be performed.  7. Thoroughly rinse your body with warm water from the neck down.  8. DO NOT shower/wash with your normal soap after using and rinsing off the CHG Soap.  9. Pat yourself dry with a CLEAN TOWEL.  10. Wear CLEAN PAJAMAS to bed the night before surgery  11. Place CLEAN SHEETS on your bed the night of your first shower and DO NOT SLEEP WITH PETS.   Day of Surgery: Shower with CHG soap as  directed Wear Clean/Comfortable clothing the morning of surgery Do not apply any deodorants/lotions.   Remember to brush your teeth WITH YOUR REGULAR TOOTHPASTE.   Please read over the following fact sheets that you were given.

## 2020-05-11 ENCOUNTER — Encounter (HOSPITAL_COMMUNITY): Payer: Self-pay

## 2020-05-11 ENCOUNTER — Other Ambulatory Visit: Payer: Self-pay

## 2020-05-11 ENCOUNTER — Other Ambulatory Visit (HOSPITAL_COMMUNITY)
Admission: RE | Admit: 2020-05-11 | Discharge: 2020-05-11 | Disposition: A | Discharge status: Home / Self Care | Payer: BLUE CROSS/BLUE SHIELD | Source: Ambulatory Visit | Attending: Neurosurgery | Admitting: Neurosurgery

## 2020-05-11 ENCOUNTER — Encounter (HOSPITAL_COMMUNITY)
Admission: RE | Admit: 2020-05-11 | Discharge: 2020-05-11 | Disposition: A | Discharge status: Home / Self Care | Payer: BLUE CROSS/BLUE SHIELD | Source: Ambulatory Visit | Attending: Neurosurgery | Admitting: Neurosurgery

## 2020-05-11 DIAGNOSIS — Z20822 Contact with and (suspected) exposure to covid-19: Secondary | ICD-10-CM | POA: Diagnosis not present

## 2020-05-11 DIAGNOSIS — Z01812 Encounter for preprocedural laboratory examination: Secondary | ICD-10-CM | POA: Insufficient documentation

## 2020-05-11 HISTORY — DX: Adverse effect of other opioids, initial encounter: T40.2X5A

## 2020-05-11 HISTORY — DX: Adverse effect of other opioids, initial encounter: K59.03

## 2020-05-11 HISTORY — DX: Polyneuropathy, unspecified: G62.9

## 2020-05-11 HISTORY — DX: Anxiety disorder, unspecified: F41.9

## 2020-05-11 HISTORY — DX: Depression, unspecified: F32.A

## 2020-05-11 LAB — CBC WITH DIFFERENTIAL/PLATELET
Abs Immature Granulocytes: 0.02 10*3/uL (ref 0.00–0.07)
Basophils Absolute: 0 10*3/uL (ref 0.0–0.1)
Basophils Relative: 0 %
Eosinophils Absolute: 0.1 10*3/uL (ref 0.0–0.5)
Eosinophils Relative: 1 %
HCT: 42.9 % (ref 39.0–52.0)
Hemoglobin: 13.3 g/dL (ref 13.0–17.0)
Immature Granulocytes: 0 %
Lymphocytes Relative: 19 %
Lymphs Abs: 1.4 10*3/uL (ref 0.7–4.0)
MCH: 28.1 pg (ref 26.0–34.0)
MCHC: 31 g/dL (ref 30.0–36.0)
MCV: 90.5 fL (ref 80.0–100.0)
Monocytes Absolute: 0.4 10*3/uL (ref 0.1–1.0)
Monocytes Relative: 6 %
Neutro Abs: 5.5 10*3/uL (ref 1.7–7.7)
Neutrophils Relative %: 74 %
Platelets: 347 10*3/uL (ref 150–400)
RBC: 4.74 MIL/uL (ref 4.22–5.81)
RDW: 13.8 % (ref 11.5–15.5)
WBC: 7.5 10*3/uL (ref 4.0–10.5)
nRBC: 0 % (ref 0.0–0.2)

## 2020-05-11 LAB — SARS CORONAVIRUS 2 (TAT 6-24 HRS): SARS Coronavirus 2: NEGATIVE

## 2020-05-11 LAB — SURGICAL PCR SCREEN
MRSA, PCR: NEGATIVE
Staphylococcus aureus: NEGATIVE

## 2020-05-11 NOTE — Progress Notes (Signed)
PCP - Dr. Charline Bills, Thana Farr- records in [place] where  Cardiologist - no  Chest x-ray - na  EKG - na  Stress Test -  no  ECHO - 2017  Cardiac Cath - no  Sleep Study - no CPAP - no  LABS-CBC with diff  ASA-na  ERAS-no HA1C-na Fasting Blood Sugar - na Checks Blood Sugar ___0__ times a day  Anesthesia-  Pt denies having chest pain, sob, or fever at this time. All instructions explained to the pt, with a verbal understanding of the material. Pt agrees to go over the instructions while at home for a better understanding. Pt also instructed to self quarantine after being tested for COVID-19. The opportunity to ask questions was provided.

## 2020-05-11 NOTE — Progress Notes (Signed)
Your procedure is scheduled on Tuesday 05/15/2020.  Report to [facility] Main Entrance "A" at 6:00 A.M., and check in at the Admitting office.                Your surgery or procedure is scheduled to begin at 8:00 AM   Call this number if you have problems the morning of surgery:  [phone]  Call [phone] if you have any questions prior to your surgery date Monday-Friday 8am-4pm    Remember:  Do not eat or drink after midnight the night before your surgery   Take these medicines the morning of surgery with A SIP OF WATER: Duloxetine (Cymbalta) Fluoxetine (Prozac) Omeprazole (Prilosec)  If NEEDED you may take the following medications the morning of surgery: Acetaminophen (Tylenol) Diazepam (Valium) Oxycodone HCl  As of today, STOP taking any Meloxicam (Mobic), Aspirin (unless otherwise instructed by your surgeon) Aleve, Naproxen, Ibuprofen, Motrin, Advil, Goody's, BC's, all herbal medications, fish oil, and all vitamins.               Special instructions:   [facility]- Preparing For Surgery  Before surgery, you can play an important role. Because skin is not sterile, your skin needs to be as free of germs as possible. You can reduce the number of germs on your skin by washing with CHG (chlorahexidine gluconate) Soap before surgery.  CHG is an antiseptic cleaner which kills germs and bonds with the skin to continue killing germs even after washing.    Oral Hygiene is also important to reduce your risk of infection.  Remember - BRUSH YOUR TEETH THE MORNING OF SURGERY WITH YOUR REGULAR TOOTHPASTE  Please do not use if you have an allergy to CHG or antibacterial soaps. If your skin becomes reddened/irritated stop using the CHG.  Do not shave (including legs and underarms) for at least 48 hours prior to first CHG shower. It is OK to shave your face.  Please follow these instructions carefully.   1. Shower the NIGHT BEFORE SURGERY and the MORNING OF SURGERY with CHG  Soap.   2. If you chose to wash your hair, wash your hair first as usual with your normal shampoo.  3. After you shampoo,wash your face and private area with the soap you use at home, then rinse your hair and body thoroughly to remove the shampoo and soap 4. Use CHG as you would any other liquid soap. You can apply CHG directly to the skin and wash gently with a scrungie or a clean washcloth.   5. Apply the CHG Soap to your body ONLY FROM THE NECK DOWN.  Do not use on open wounds or open sores. Avoid contact with your eyes, ears, mouth and genitals (private parts). Wash Face and genitals (private parts)  with your normal soap.   6. Wash thoroughly, paying special attention to the area where your surgery will be performed.  7. Thoroughly rinse your body with warm water from the neck down.  8. DO NOT shower/wash with your normal soap after using and rinsing off the CHG Soap.  9. Pat yourself dry with a CLEAN TOWEL.  10. Wear CLEAN PAJAMAS to bed the night before surgery  11. Place CLEAN SHEETS on your bed the night of your first shower and DO NOT SLEEP WITH PETS.  Day of Surgery: Shower as instructed above.       Do not wear lotions, powders, colognes, or deodorant. Shower with CHG soap as directed  Wear Clean/Comfortable clothing the morning of surgery   Remember to brush your teeth WITH YOUR REGULAR TOOTHPASTE.                      Do not wear jewelry, make up, or nail polish            Do not wear lotions, powders, colognes, or deodorant.            Men may shave face and neck.            Do not bring valuables to the hospital.            [facility] is not responsible for any belongings or valuables.  Do NOT Smoke (Tobacco/Vaping) or drink Alcohol 24 hours prior to your procedure  If you use a CPAP at night, you may bring all equipment for your overnight stay.   Contacts, glasses, dentures or bridgework may not be worn into surgery.      For patients admitted to the  hospital, discharge time will be determined by your treatment team.   Patients discharged the day of surgery will not be allowed to drive home, and someone needs to stay with them for 24 hours.  Please read over the following fact sheets that you were given.

## 2020-05-14 NOTE — Anesthesia Preprocedure Evaluation (Addendum)
Anesthesia Evaluation  Patient identified by MRN, date of birth, ID band Patient awake    Reviewed: Patient's Chart, lab work & pertinent test results  Airway Mallampati: II  TM Distance: >3 FB Neck ROM: Full    Dental  (+) Teeth Intact   Pulmonary neg pulmonary ROS,    Pulmonary exam normal        Cardiovascular negative cardio ROS   Rhythm:Regular Rate:Normal     Neuro/Psych Anxiety Depression negative neurological ROS     GI/Hepatic negative GI ROS, Neg liver ROS,   Endo/Other  negative endocrine ROS  Renal/GU negative Renal ROS  negative genitourinary   Musculoskeletal  (+) Arthritis ,   Abdominal (+)  Abdomen: soft. Bowel sounds: normal.  Peds  Hematology negative hematology ROS (+)   Anesthesia Other Findings   Reproductive/Obstetrics                            Anesthesia Physical Anesthesia Plan  ASA: II  Anesthesia Plan: General   Post-op Pain Management:    Induction: Intravenous  PONV Risk Score and Plan: 2 and Ondansetron, Dexamethasone, Midazolam and Treatment may vary due to age or medical condition  Airway Management Planned: Mask and Oral ETT  Additional Equipment: None  Intra-op Plan:   Post-operative Plan: Extubation in OR  Informed Consent: I have reviewed the patients History and Physical, chart, labs and discussed the procedure including the risks, benefits and alternatives for the proposed anesthesia with the patient or authorized representative who has indicated his/her understanding and acceptance.     Dental advisory given  Plan Discussed with: CRNA  Anesthesia Plan Comments: (Lab Results      Component                Value               Date                      WBC                      7.5                 05/11/2020                HGB                      13.3                05/11/2020                HCT                      42.9                 05/11/2020                MCV                      90.5                05/11/2020                PLT                      347  05/11/2020          )        Anesthesia Quick Evaluation

## 2020-05-15 ENCOUNTER — Encounter (HOSPITAL_COMMUNITY): Payer: Self-pay | Admitting: Neurosurgery

## 2020-05-15 ENCOUNTER — Ambulatory Visit (HOSPITAL_COMMUNITY)
Admission: RE | Admit: 2020-05-15 | Discharge: 2020-05-15 | Disposition: A | Discharge status: Home / Self Care | Payer: BLUE CROSS/BLUE SHIELD | Attending: Neurosurgery | Admitting: Neurosurgery

## 2020-05-15 ENCOUNTER — Inpatient Hospital Stay (HOSPITAL_COMMUNITY): Payer: BLUE CROSS/BLUE SHIELD | Admitting: Anesthesiology

## 2020-05-15 ENCOUNTER — Encounter (HOSPITAL_COMMUNITY)
Admission: RE | Disposition: A | Discharge status: Home / Self Care | Payer: Self-pay | Source: Home / Self Care | Attending: Neurosurgery

## 2020-05-15 ENCOUNTER — Inpatient Hospital Stay (HOSPITAL_COMMUNITY): Payer: BLUE CROSS/BLUE SHIELD

## 2020-05-15 ENCOUNTER — Other Ambulatory Visit: Payer: Self-pay

## 2020-05-15 DIAGNOSIS — Z538 Procedure and treatment not carried out for other reasons: Secondary | ICD-10-CM | POA: Diagnosis not present

## 2020-05-15 DIAGNOSIS — Z791 Long term (current) use of non-steroidal anti-inflammatories (NSAID): Secondary | ICD-10-CM | POA: Diagnosis not present

## 2020-05-15 DIAGNOSIS — Z79899 Other long term (current) drug therapy: Secondary | ICD-10-CM | POA: Diagnosis not present

## 2020-05-15 DIAGNOSIS — Z419 Encounter for procedure for purposes other than remedying health state, unspecified: Secondary | ICD-10-CM

## 2020-05-15 DIAGNOSIS — M5136 Other intervertebral disc degeneration, lumbar region: Secondary | ICD-10-CM | POA: Insufficient documentation

## 2020-05-15 DIAGNOSIS — M960 Pseudarthrosis after fusion or arthrodesis: Secondary | ICD-10-CM | POA: Insufficient documentation

## 2020-05-15 DIAGNOSIS — T8484XA Pain due to internal orthopedic prosthetic devices, implants and grafts, initial encounter: Secondary | ICD-10-CM

## 2020-05-15 HISTORY — PX: LAMINECTOMY WITH POSTERIOR LATERAL ARTHRODESIS LEVEL 4: SHX6338

## 2020-05-15 SURGERY — LAMINECTOMY WITH POSTERIOR LATERAL ARTHRODESIS LEVEL 4
Anesthesia: General | Site: Back

## 2020-05-15 MED ORDER — MIDAZOLAM HCL 5 MG/5ML IJ SOLN
INTRAMUSCULAR | Status: DC | PRN
  Administered 2020-05-15: 2 mg via INTRAVENOUS

## 2020-05-15 MED ORDER — THROMBIN (RECOMBINANT) 5000 UNITS EX SOLR
CUTANEOUS | Status: AC
  Filled 2020-05-15: qty 5000

## 2020-05-15 MED ORDER — DIAZEPAM 5 MG PO TABS: 5.0000 mg | ORAL_TABLET | Freq: Three times a day (TID) | ORAL | 0 refills | Status: DC | PRN

## 2020-05-15 MED ORDER — FENTANYL CITRATE (PF) 250 MCG/5ML IJ SOLN
INTRAMUSCULAR | Status: DC | PRN
  Administered 2020-05-15: 100 ug via INTRAVENOUS

## 2020-05-15 MED ORDER — OXYCODONE HCL 5 MG/5ML PO SOLN: 5.0000 mg | Freq: Once | ORAL | Status: DC | PRN

## 2020-05-15 MED ORDER — CEFAZOLIN SODIUM-DEXTROSE 2-4 GM/100ML-% IV SOLN
2.0000 g | INTRAVENOUS | Status: DC
  Filled 2020-05-15: qty 100

## 2020-05-15 MED ORDER — BUPIVACAINE HCL (PF) 0.25 % IJ SOLN
INTRAMUSCULAR | Status: AC
  Filled 2020-05-15: qty 30

## 2020-05-15 MED ORDER — CHLORHEXIDINE GLUCONATE 0.12 % MT SOLN: 15.0000 mL | Freq: Once | OROMUCOSAL | Status: AC

## 2020-05-15 MED ORDER — HYDROMORPHONE HCL 1 MG/ML IJ SOLN: 0.2500 mg | INTRAMUSCULAR | Status: DC | PRN

## 2020-05-15 MED ORDER — OXYCODONE HCL 10 MG PO TABS: 10.0000 mg | ORAL_TABLET | Freq: Four times a day (QID) | ORAL | 0 refills | Status: DC | PRN

## 2020-05-15 MED ORDER — VANCOMYCIN HCL 1000 MG IV SOLR
INTRAVENOUS | Status: AC
  Filled 2020-05-15: qty 1000

## 2020-05-15 MED ORDER — ACETAMINOPHEN 10 MG/ML IV SOLN: 1000.0000 mg | Freq: Once | INTRAVENOUS | Status: DC | PRN

## 2020-05-15 MED ORDER — CHLORHEXIDINE GLUCONATE 0.12 % MT SOLN
OROMUCOSAL | Status: AC
  Administered 2020-05-15: 15 mL via OROMUCOSAL
  Filled 2020-05-15: qty 15

## 2020-05-15 MED ORDER — ORAL CARE MOUTH RINSE: 15.0000 mL | Freq: Once | OROMUCOSAL | Status: AC

## 2020-05-15 MED ORDER — CHLORHEXIDINE GLUCONATE CLOTH 2 % EX PADS: 6.0000 | MEDICATED_PAD | Freq: Once | CUTANEOUS | Status: DC

## 2020-05-15 MED ORDER — PROPOFOL 10 MG/ML IV BOLUS
INTRAVENOUS | Status: AC
  Filled 2020-05-15: qty 20

## 2020-05-15 MED ORDER — DEXAMETHASONE SODIUM PHOSPHATE 10 MG/ML IJ SOLN
10.0000 mg | Freq: Once | INTRAMUSCULAR | Status: DC
  Filled 2020-05-15: qty 1

## 2020-05-15 MED ORDER — LACTATED RINGERS IV SOLN: INTRAVENOUS | Status: DC

## 2020-05-15 MED ORDER — OXYCODONE HCL 5 MG PO TABS: 5.0000 mg | ORAL_TABLET | Freq: Once | ORAL | Status: DC | PRN

## 2020-05-15 MED ORDER — ROCURONIUM BROMIDE 10 MG/ML (PF) SYRINGE
PREFILLED_SYRINGE | INTRAVENOUS | Status: AC
  Filled 2020-05-15: qty 10

## 2020-05-15 MED ORDER — ONDANSETRON HCL 4 MG/2ML IJ SOLN: 4.0000 mg | Freq: Once | INTRAMUSCULAR | Status: DC | PRN

## 2020-05-15 MED ORDER — MIDAZOLAM HCL 2 MG/2ML IJ SOLN
INTRAMUSCULAR | Status: AC
  Filled 2020-05-15: qty 2

## 2020-05-15 MED ORDER — FENTANYL CITRATE (PF) 250 MCG/5ML IJ SOLN
INTRAMUSCULAR | Status: AC
  Filled 2020-05-15: qty 5

## 2020-05-15 MED ORDER — THROMBIN 20000 UNITS EX SOLR
CUTANEOUS | Status: AC
  Filled 2020-05-15: qty 20000

## 2020-05-15 SURGICAL SUPPLY — 67 items
BAG DECANTER FOR FLEXI CONT (MISCELLANEOUS) IMPLANT
BENZOIN TINCTURE PRP APPL 2/3 (GAUZE/BANDAGES/DRESSINGS) IMPLANT
BIT DRILL LONG 3.0X30 (BIT) IMPLANT
BIT DRILL LONG 3.0X30MM (BIT)
BIT DRILL LONG 3X80 (BIT) IMPLANT
BIT DRILL LONG 3X80MM (BIT)
BIT DRILL LONG 4X80 (BIT) IMPLANT
BIT DRILL LONG 4X80MM (BIT)
BIT DRILL SHORT 3.0X30 (BIT) IMPLANT
BIT DRILL SHORT 3.0X30MM (BIT)
BIT DRILL SHORT 3X80 (BIT) IMPLANT
BIT DRILL SHORT 3X80MM (BIT)
BLADE CLIPPER SURG (BLADE) IMPLANT
BLADE SURG 11 STRL SS (BLADE) IMPLANT
BUR CUTTER 7.0 ROUND (BURR) IMPLANT
BUR MATCHSTICK NEURO 3.0 LAGG (BURR) IMPLANT
CANISTER SUCT 3000ML PPV (MISCELLANEOUS) IMPLANT
CARTRIDGE OIL MAESTRO DRILL (MISCELLANEOUS) IMPLANT
CLOSURE WOUND 1/2 X4 (GAUZE/BANDAGES/DRESSINGS)
CNTNR URN SCR LID CUP LEK RST (MISCELLANEOUS) IMPLANT
CONT SPEC 4OZ STRL OR WHT (MISCELLANEOUS)
COVER BACK TABLE 60X90IN (DRAPES) IMPLANT
COVER WAND RF STERILE (DRAPES) IMPLANT
DECANTER SPIKE VIAL GLASS SM (MISCELLANEOUS) IMPLANT
DERMABOND ADVANCED (GAUZE/BANDAGES/DRESSINGS)
DERMABOND ADVANCED .7 DNX12 (GAUZE/BANDAGES/DRESSINGS) IMPLANT
DIFFUSER DRILL AIR PNEUMATIC (MISCELLANEOUS) IMPLANT
DRAPE C-ARM 42X72 X-RAY (DRAPES) IMPLANT
DRAPE HALF SHEET 40X57 (DRAPES) IMPLANT
DRAPE LAPAROTOMY 100X72X124 (DRAPES) IMPLANT
DRAPE SHEET LG 3/4 BI-LAMINATE (DRAPES) IMPLANT
DRAPE SURG 17X23 STRL (DRAPES) IMPLANT
DURAPREP 26ML APPLICATOR (WOUND CARE) IMPLANT
ELECT BLADE 4.0 EZ CLEAN MEGAD (MISCELLANEOUS)
ELECT REM PT RETURN 9FT ADLT (ELECTROSURGICAL)
ELECTRODE BLDE 4.0 EZ CLN MEGD (MISCELLANEOUS) IMPLANT
ELECTRODE REM PT RTRN 9FT ADLT (ELECTROSURGICAL) IMPLANT
EVACUATOR 1/8 PVC DRAIN (DRAIN) IMPLANT
GAUZE 4X4 16PLY RFD (DISPOSABLE) IMPLANT
GAUZE SPONGE 4X4 12PLY STRL (GAUZE/BANDAGES/DRESSINGS) IMPLANT
GLOVE ECLIPSE 9.0 STRL (GLOVE) IMPLANT
GLOVE EXAM NITRILE XL STR (GLOVE) IMPLANT
GOWN STRL REUS W/ TWL LRG LVL3 (GOWN DISPOSABLE) IMPLANT
GOWN STRL REUS W/ TWL XL LVL3 (GOWN DISPOSABLE) IMPLANT
GOWN STRL REUS W/TWL 2XL LVL3 (GOWN DISPOSABLE) IMPLANT
GOWN STRL REUS W/TWL LRG LVL3 (GOWN DISPOSABLE)
GOWN STRL REUS W/TWL XL LVL3 (GOWN DISPOSABLE)
KIT BASIN OR (CUSTOM PROCEDURE TRAY) IMPLANT
KIT SPINE MAZOR X ROBO DISP (MISCELLANEOUS) IMPLANT
KIT TURNOVER KIT B (KITS) IMPLANT
NEEDLE HYPO 22GX1.5 SAFETY (NEEDLE) IMPLANT
NS IRRIG 1000ML POUR BTL (IV SOLUTION) IMPLANT
OIL CARTRIDGE MAESTRO DRILL (MISCELLANEOUS)
PACK LAMINECTOMY NEURO (CUSTOM PROCEDURE TRAY) IMPLANT
PIN HEAD 2.5X60MM (PIN) IMPLANT
SCREW SCHANZ SA 4.0MM (MISCELLANEOUS) IMPLANT
SPONGE SURGIFOAM ABS GEL 100 (HEMOSTASIS) IMPLANT
STRIP CLOSURE SKIN 1/2X4 (GAUZE/BANDAGES/DRESSINGS) IMPLANT
SUT VIC AB 0 CT1 18XCR BRD8 (SUTURE) IMPLANT
SUT VIC AB 0 CT1 8-18 (SUTURE)
SUT VIC AB 2-0 CT1 18 (SUTURE) IMPLANT
SUT VIC AB 3-0 SH 8-18 (SUTURE) IMPLANT
TOWEL GREEN STERILE (TOWEL DISPOSABLE) IMPLANT
TOWEL GREEN STERILE FF (TOWEL DISPOSABLE) IMPLANT
TRAY FOLEY MTR SLVR 16FR STAT (SET/KITS/TRAYS/PACK) IMPLANT
TUBE MAZOR SA REDUCTION (TUBING) IMPLANT
WATER STERILE IRR 1000ML POUR (IV SOLUTION) IMPLANT

## 2020-05-15 NOTE — Op Note (Signed)
Surgery canceled secondary to hardware complications with the stereotactic robot.  Plan to reschedule Friday.

## 2020-05-15 NOTE — Transfer of Care (Signed)
Immediate Anesthesia Transfer of Care Note  Patient: Chase Gonzalez  Procedure(s) Performed: CANCELLED PROCEDURE (N/A Back)  Patient Location: PACU  Anesthesia Type:cancelled case. Only versed and fentanyl given.   Level of Consciousness: awake, alert , oriented and patient cooperative  Airway & Oxygen Therapy: Patient Spontanous Breathing  Post-op Assessment: Report given to RN, Post -op Vital signs reviewed and stable and Patient moving all extremities  Post vital signs: Reviewed and stable  Last Vitals:  Vitals Value Taken Time  BP 111/83 05/15/20 0836  Temp    Pulse 53 05/15/20 0846  Resp 16 05/15/20 0846  SpO2 94 % 05/15/20 0846  Vitals shown include unvalidated device data.  Last Pain:  Vitals:   05/15/20 0644  TempSrc: Oral  PainSc: 7       Patients Stated Pain Goal: 3 (05/15/20 0300)  Complications: No complications documented.

## 2020-05-15 NOTE — Discharge Summary (Signed)
Admission was canceled secondary to the surgery being canceled.  Patient will reschedule later in the week.

## 2020-05-15 NOTE — H&P (Signed)
Chase Gonzalez is an 59 y.o. male.   Chief Complaint: Back pain HPI: 59 year old male with a complex spinal history.  Patient status post prior anterior and posterior cervical decompressive surgery for treatment of a severe compressive cervical myelopathy.  Patient also status post lumbosacral decompression and later decompression and fusion.  Decompression and fusion at L4-5 and L5-S1 complicated by pseudoarthrosis and increasing pain.  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  Patient is status post removal of hardware at L4-5 and S1.  His pain is persisted.  Imaging demonstrates marked progression of his adjacent level degeneration at L3-4.  There is continued reasonably stable but nonunion at L4-5 and L5S1.  The patient's imaging is worrisome for discitis at L3-4 but laboratory studies have been negative.  Plan is for patient to undergo reexploration of his lumbosacral fusion with revision instrumentation and new pelvic fixation in hopes of improving situation.  Past Medical History:  Diagnosis Date  . Anxiety   . Arthritis   . Constipation due to opioid therapy   . Depression    due to pain  . History of kidney stones    history of previous kidney stones, several   . Neuropathy     Past Surgical History:  Procedure Laterality Date  . ANTERIOR CERVICAL DECOMP/DISCECTOMY FUSION  10/15/2017   Dr. Jordan Likes  . HARDWARE REMOVAL N/A 12/09/2019   Procedure: Removal of Lumbar Four, Lumbar Five and Sacral One pedicle screws;  Surgeon: Julio Sicks, MD;  Location: [facility] OR;  Service: Neurosurgery;  Laterality: N/A;  Removal of Lumbar Four, Lumbar Five and Sacral One pedicle screws  . JOINT REPLACEMENT Left    Knee  . posterior cervical disc fusion  02/26/2017  . ROTATOR CUFF REPAIR Bilateral   . THORACIC DISC SURGERY     2017, 2 ruptured discs, had emergency surgery    History reviewed. No pertinent family history. Social History:  reports that he has never smoked. His  smokeless tobacco use includes chew. He reports previous alcohol use. He reports previous drug use.  Allergies: No Known Allergies  Medications Prior to Admission  Medication Sig Dispense Refill  . acetaminophen (TYLENOL) 500 MG tablet Take 1,000 mg by mouth every 8 (eight) hours as needed for moderate pain.     . Calcium-Magnesium-Zinc (CAL-MAG-ZINC PO) Take 1 tablet by mouth daily.    . Cholecalciferol (VITAMIN D3 PO) Take 1 capsule by mouth daily.    . diazepam (VALIUM) 5 MG tablet Take 1-2 tablets (5-10 mg total) by mouth every 6 (six) hours as needed for muscle spasms. (Patient taking differently: Take 5 mg by mouth every 8 (eight) hours as needed for muscle spasms. ) 30 tablet 0  . diphenhydramine-acetaminophen (TYLENOL PM) 25-500 MG TABS tablet Take 2 tablets by mouth at bedtime.    . docusate sodium (COLACE) 100 MG capsule Take 200 mg by mouth daily.     . DULoxetine (CYMBALTA) 60 MG capsule Take 60 mg by mouth daily.    Marland Kitchen FLUoxetine (PROZAC) 10 MG capsule Take 10 mg by mouth daily.    . meloxicam (MOBIC) 15 MG tablet Take 15 mg by mouth daily.    . Misc Natural Products (GLUCOSAMINE CHOND MSM FORMULA PO) Take 1 tablet by mouth daily.    . Multiple Vitamins-Minerals (MULTIVITAMIN WITH MINERALS) tablet Take 1 tablet by mouth daily. Mens    . Omega-3 Fatty Acids (FISH OIL PO) Take 1 capsule by mouth daily.    Marland Kitchen omeprazole (PRILOSEC)  40 MG capsule Take 40 mg by mouth daily.    . Oxycodone HCl 10 MG TABS Take 1 tablet (10 mg total) by mouth every 6 (six) hours as needed ((score 7 to 10)). 40 tablet 0  . polyethylene glycol (MIRALAX / GLYCOLAX) 17 g packet Take 17 g by mouth daily.    . Probiotic Product (PROBIOTIC PO) Take 1 capsule by mouth daily.      No results found for this or any previous visit (from the past 48 hour(s)). No results found.  Pertinent items noted in HPI and remainder of comprehensive ROS otherwise negative.  Blood pressure 125/87, pulse (!) 59, temperature 98.3  F (36.8 C), temperature source Oral, resp. rate 20, height 5\' 11"  (1.803 m), weight 89 kg, SpO2 97 %.  Patient is awake and alert.  He is oriented and appropriate.  Speech is fluent.  Judgment insight are intact.  Cranial nerve function normal bilateral.  Motor examination extremities reveals 4+/5 strength in both grips and intrinsics.  He has 4+/5 strength in both lower extremities with increased tone and spasticity.  Gait is spastic.  Sensor examination some decrease sensation pinprick light touch distally in both lower extremities.  Examination head ears eyes nose throat is unremarkable chest and abdomen benign.  Extremities are free from injury deformity. 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  Plan L2-3-4 5 S1 and S2 alar iliac pedicle screw fixation with posterior lateral fusion utilizing local autograft and morselized allograft and bone morphogenic protein.  We will also use the stereotactic robot for assistance.  Risks and benefits been explained.  Patient wishes to proceed.  A Carynn Felling 05/15/2020, 7:57 AM

## 2020-05-15 NOTE — Progress Notes (Signed)
Patient surgery canceled this morning secondary to mechanical malfunction of the robotic arm.  Plan to reschedule later this week.  Patient aware and understands.

## 2020-05-16 ENCOUNTER — Encounter (HOSPITAL_COMMUNITY): Payer: Self-pay | Admitting: Neurosurgery

## 2020-05-21 ENCOUNTER — Other Ambulatory Visit (HOSPITAL_COMMUNITY)
Admission: RE | Admit: 2020-05-21 | Discharge: 2020-05-21 | Disposition: A | Discharge status: Home / Self Care | Payer: BC Managed Care – PPO | Source: Ambulatory Visit | Attending: Neurosurgery | Admitting: Neurosurgery

## 2020-05-21 ENCOUNTER — Other Ambulatory Visit: Payer: Self-pay | Admitting: Neurosurgery

## 2020-05-21 DIAGNOSIS — Z20822 Contact with and (suspected) exposure to covid-19: Secondary | ICD-10-CM | POA: Insufficient documentation

## 2020-05-21 DIAGNOSIS — Z01812 Encounter for preprocedural laboratory examination: Secondary | ICD-10-CM | POA: Insufficient documentation

## 2020-05-21 LAB — SARS CORONAVIRUS 2 (TAT 6-24 HRS): SARS Coronavirus 2: NEGATIVE

## 2020-05-21 NOTE — Progress Notes (Signed)
Called pt and gave him time of arrival for surgery. Instructed pt to arrive at 8:30 AM. Pt had a PAT appt on 05/11/20.

## 2020-05-22 ENCOUNTER — Encounter (HOSPITAL_COMMUNITY)
Admission: RE | Disposition: A | Discharge status: Home Health | Payer: Self-pay | Source: Home / Self Care | Attending: Neurosurgery

## 2020-05-22 ENCOUNTER — Inpatient Hospital Stay (HOSPITAL_COMMUNITY)
Admission: RE | Admit: 2020-05-22 | Discharge: 2020-05-24 | DRG: 460 | Disposition: A | Discharge status: Home Health | Payer: BC Managed Care – PPO | Attending: Neurosurgery | Admitting: Neurosurgery

## 2020-05-22 ENCOUNTER — Inpatient Hospital Stay (HOSPITAL_COMMUNITY): Payer: BC Managed Care – PPO | Admitting: Anesthesiology

## 2020-05-22 ENCOUNTER — Other Ambulatory Visit: Payer: Self-pay

## 2020-05-22 ENCOUNTER — Encounter (HOSPITAL_COMMUNITY): Payer: Self-pay | Admitting: Neurosurgery

## 2020-05-22 ENCOUNTER — Inpatient Hospital Stay (HOSPITAL_COMMUNITY): Payer: BC Managed Care – PPO

## 2020-05-22 DIAGNOSIS — F32A Depression, unspecified: Secondary | ICD-10-CM | POA: Diagnosis present

## 2020-05-22 DIAGNOSIS — M5136 Other intervertebral disc degeneration, lumbar region: Secondary | ICD-10-CM | POA: Diagnosis present

## 2020-05-22 DIAGNOSIS — F419 Anxiety disorder, unspecified: Secondary | ICD-10-CM | POA: Diagnosis present

## 2020-05-22 DIAGNOSIS — G629 Polyneuropathy, unspecified: Secondary | ICD-10-CM | POA: Diagnosis present

## 2020-05-22 DIAGNOSIS — Z96652 Presence of left artificial knee joint: Secondary | ICD-10-CM | POA: Diagnosis present

## 2020-05-22 DIAGNOSIS — Z79899 Other long term (current) drug therapy: Secondary | ICD-10-CM

## 2020-05-22 DIAGNOSIS — S32009K Unspecified fracture of unspecified lumbar vertebra, subsequent encounter for fracture with nonunion: Secondary | ICD-10-CM | POA: Diagnosis present

## 2020-05-22 DIAGNOSIS — M960 Pseudarthrosis after fusion or arthrodesis: Principal | ICD-10-CM | POA: Diagnosis present

## 2020-05-22 DIAGNOSIS — Z419 Encounter for procedure for purposes other than remedying health state, unspecified: Secondary | ICD-10-CM

## 2020-05-22 DIAGNOSIS — Z720 Tobacco use: Secondary | ICD-10-CM

## 2020-05-22 DIAGNOSIS — Z20822 Contact with and (suspected) exposure to covid-19: Secondary | ICD-10-CM | POA: Diagnosis present

## 2020-05-22 DIAGNOSIS — Z981 Arthrodesis status: Secondary | ICD-10-CM | POA: Diagnosis not present

## 2020-05-22 HISTORY — PX: LAMINECTOMY WITH POSTERIOR LATERAL ARTHRODESIS LEVEL 4: SHX6338

## 2020-05-22 HISTORY — PX: APPLICATION OF ROBOTIC ASSISTANCE FOR SPINAL PROCEDURE: SHX6753

## 2020-05-22 LAB — TYPE AND SCREEN
ABO/RH(D): B NEG
ABO/RH(D): B NEG
Antibody Screen: NEGATIVE
Antibody Screen: NEGATIVE

## 2020-05-22 SURGERY — LAMINECTOMY WITH POSTERIOR LATERAL ARTHRODESIS LEVEL 4
Anesthesia: General | Site: Back

## 2020-05-22 MED ORDER — HYDROMORPHONE HCL 1 MG/ML IJ SOLN
1.0000 mg | INTRAMUSCULAR | Status: DC | PRN
  Administered 2020-05-22: 1 mg via INTRAVENOUS
  Filled 2020-05-22: qty 1

## 2020-05-22 MED ORDER — DIPHENHYDRAMINE-APAP (SLEEP) 25-500 MG PO TABS: 2.0000 | ORAL_TABLET | Freq: Every day | ORAL | Status: DC

## 2020-05-22 MED ORDER — CHLORHEXIDINE GLUCONATE 0.12 % MT SOLN: 15.0000 mL | Freq: Once | OROMUCOSAL | Status: AC

## 2020-05-22 MED ORDER — ONDANSETRON HCL 4 MG/2ML IJ SOLN: 4.0000 mg | Freq: Four times a day (QID) | INTRAMUSCULAR | Status: DC | PRN

## 2020-05-22 MED ORDER — KETAMINE HCL 10 MG/ML IJ SOLN
INTRAMUSCULAR | Status: DC | PRN
  Administered 2020-05-22: 10 mg via INTRAVENOUS
  Administered 2020-05-22: 20 mg via INTRAVENOUS
  Administered 2020-05-22 (×2): 10 mg via INTRAVENOUS

## 2020-05-22 MED ORDER — ACETAMINOPHEN 325 MG PO TABS
650.0000 mg | ORAL_TABLET | ORAL | Status: DC | PRN
  Administered 2020-05-23 – 2020-05-24 (×3): 650 mg via ORAL
  Filled 2020-05-22 (×3): qty 2

## 2020-05-22 MED ORDER — LACTATED RINGERS IV SOLN: INTRAVENOUS | Status: DC

## 2020-05-22 MED ORDER — THROMBIN 20000 UNITS EX SOLR
CUTANEOUS | Status: AC
  Filled 2020-05-22: qty 20000

## 2020-05-22 MED ORDER — DEXAMETHASONE SODIUM PHOSPHATE 10 MG/ML IJ SOLN
INTRAMUSCULAR | Status: DC | PRN
  Administered 2020-05-22: 10 mg via INTRAVENOUS

## 2020-05-22 MED ORDER — DEXAMETHASONE SODIUM PHOSPHATE 10 MG/ML IJ SOLN
10.0000 mg | Freq: Once | INTRAMUSCULAR | Status: DC
  Filled 2020-05-22: qty 1

## 2020-05-22 MED ORDER — MENTHOL 3 MG MT LOZG: 1.0000 | LOZENGE | OROMUCOSAL | Status: DC | PRN

## 2020-05-22 MED ORDER — PHENYLEPHRINE 40 MCG/ML (10ML) SYRINGE FOR IV PUSH (FOR BLOOD PRESSURE SUPPORT)
PREFILLED_SYRINGE | INTRAVENOUS | Status: AC
  Filled 2020-05-22: qty 20

## 2020-05-22 MED ORDER — FENTANYL CITRATE (PF) 250 MCG/5ML IJ SOLN
INTRAMUSCULAR | Status: DC | PRN
  Administered 2020-05-22: 25 ug via INTRAVENOUS
  Administered 2020-05-22: 50 ug via INTRAVENOUS
  Administered 2020-05-22: 100 ug via INTRAVENOUS
  Administered 2020-05-22: 50 ug via INTRAVENOUS
  Administered 2020-05-22: 100 ug via INTRAVENOUS
  Administered 2020-05-22: 25 ug via INTRAVENOUS
  Administered 2020-05-22: 50 ug via INTRAVENOUS

## 2020-05-22 MED ORDER — DIPHENHYDRAMINE HCL 25 MG PO CAPS
25.0000 mg | ORAL_CAPSULE | Freq: Every day | ORAL | Status: DC
  Administered 2020-05-22 – 2020-05-23 (×2): 25 mg via ORAL
  Filled 2020-05-22 (×2): qty 1

## 2020-05-22 MED ORDER — CHLORHEXIDINE GLUCONATE 0.12 % MT SOLN
OROMUCOSAL | Status: AC
  Administered 2020-05-22: 15 mL via OROMUCOSAL
  Filled 2020-05-22: qty 15

## 2020-05-22 MED ORDER — VANCOMYCIN HCL 1000 MG IV SOLR
INTRAVENOUS | Status: DC | PRN
  Administered 2020-05-22: 1000 mg

## 2020-05-22 MED ORDER — ACETAMINOPHEN 650 MG RE SUPP: 650.0000 mg | RECTAL | Status: DC | PRN

## 2020-05-22 MED ORDER — ADULT MULTIVITAMIN W/MINERALS CH
1.0000 | ORAL_TABLET | Freq: Every day | ORAL | Status: DC
  Administered 2020-05-22 – 2020-05-24 (×3): 1 via ORAL
  Filled 2020-05-22 (×4): qty 1

## 2020-05-22 MED ORDER — BISACODYL 10 MG RE SUPP: 10.0000 mg | Freq: Every day | RECTAL | Status: DC | PRN

## 2020-05-22 MED ORDER — ONDANSETRON HCL 4 MG PO TABS: 4.0000 mg | ORAL_TABLET | Freq: Four times a day (QID) | ORAL | Status: DC | PRN

## 2020-05-22 MED ORDER — VANCOMYCIN HCL 1000 MG IV SOLR
INTRAVENOUS | Status: AC
  Filled 2020-05-22: qty 1000

## 2020-05-22 MED ORDER — SODIUM CHLORIDE 0.9% FLUSH: 3.0000 mL | INTRAVENOUS | Status: DC | PRN

## 2020-05-22 MED ORDER — POLYETHYLENE GLYCOL 3350 17 G PO PACK: 17.0000 g | PACK | Freq: Every day | ORAL | Status: DC | PRN

## 2020-05-22 MED ORDER — ONDANSETRON HCL 4 MG/2ML IJ SOLN
INTRAMUSCULAR | Status: AC
  Filled 2020-05-22: qty 4

## 2020-05-22 MED ORDER — ACETAMINOPHEN 10 MG/ML IV SOLN
INTRAVENOUS | Status: DC | PRN
  Administered 2020-05-22: 1000 mg via INTRAVENOUS

## 2020-05-22 MED ORDER — HYDROMORPHONE HCL 1 MG/ML IJ SOLN
INTRAMUSCULAR | Status: AC
  Filled 2020-05-22: qty 0.5

## 2020-05-22 MED ORDER — VITAMIN D 25 MCG (1000 UNIT) PO TABS
1000.0000 [IU] | ORAL_TABLET | Freq: Every day | ORAL | Status: DC
  Administered 2020-05-22 – 2020-05-24 (×3): 1000 [IU] via ORAL
  Filled 2020-05-22 (×6): qty 1

## 2020-05-22 MED ORDER — LIDOCAINE HCL (PF) 2 % IJ SOLN
INTRAMUSCULAR | Status: AC
  Filled 2020-05-22: qty 10

## 2020-05-22 MED ORDER — ACETAMINOPHEN 500 MG PO TABS
500.0000 mg | ORAL_TABLET | Freq: Every day | ORAL | Status: DC
  Administered 2020-05-23: 500 mg via ORAL
  Filled 2020-05-22 (×2): qty 1

## 2020-05-22 MED ORDER — FENTANYL CITRATE (PF) 100 MCG/2ML IJ SOLN
25.0000 ug | INTRAMUSCULAR | Status: DC | PRN
  Administered 2020-05-22 (×2): 25 ug via INTRAVENOUS

## 2020-05-22 MED ORDER — PROPOFOL 10 MG/ML IV BOLUS
INTRAVENOUS | Status: DC | PRN
  Administered 2020-05-22: 170 mg via INTRAVENOUS

## 2020-05-22 MED ORDER — ORAL CARE MOUTH RINSE: 15.0000 mL | Freq: Once | OROMUCOSAL | Status: AC

## 2020-05-22 MED ORDER — CEFAZOLIN SODIUM-DEXTROSE 2-4 GM/100ML-% IV SOLN
2.0000 g | INTRAVENOUS | Status: AC
  Administered 2020-05-22: 2 g via INTRAVENOUS
  Filled 2020-05-22: qty 100

## 2020-05-22 MED ORDER — SUGAMMADEX SODIUM 200 MG/2ML IV SOLN
INTRAVENOUS | Status: DC | PRN
  Administered 2020-05-22: 200 mg via INTRAVENOUS

## 2020-05-22 MED ORDER — FENTANYL CITRATE (PF) 250 MCG/5ML IJ SOLN
INTRAMUSCULAR | Status: AC
  Filled 2020-05-22: qty 5

## 2020-05-22 MED ORDER — DIAZEPAM 5 MG PO TABS
5.0000 mg | ORAL_TABLET | Freq: Four times a day (QID) | ORAL | Status: DC | PRN
  Administered 2020-05-22 – 2020-05-24 (×6): 5 mg via ORAL
  Administered 2020-05-24: 10 mg via ORAL
  Filled 2020-05-22: qty 1
  Filled 2020-05-22: qty 2
  Filled 2020-05-22 (×5): qty 1

## 2020-05-22 MED ORDER — PHENYLEPHRINE HCL-NACL 10-0.9 MG/250ML-% IV SOLN
INTRAVENOUS | Status: DC | PRN
  Administered 2020-05-22: 15 ug/min via INTRAVENOUS

## 2020-05-22 MED ORDER — KETAMINE HCL 50 MG/5ML IJ SOSY
PREFILLED_SYRINGE | INTRAMUSCULAR | Status: AC
  Filled 2020-05-22: qty 5

## 2020-05-22 MED ORDER — DULOXETINE HCL 30 MG PO CPEP
60.0000 mg | ORAL_CAPSULE | Freq: Every day | ORAL | Status: DC
  Administered 2020-05-22 – 2020-05-24 (×3): 60 mg via ORAL
  Filled 2020-05-22 (×3): qty 2

## 2020-05-22 MED ORDER — ROCURONIUM BROMIDE 10 MG/ML (PF) SYRINGE
PREFILLED_SYRINGE | INTRAVENOUS | Status: AC
  Filled 2020-05-22: qty 20

## 2020-05-22 MED ORDER — FLUOXETINE HCL 10 MG PO CAPS
10.0000 mg | ORAL_CAPSULE | Freq: Every day | ORAL | Status: DC
  Administered 2020-05-22 – 2020-05-24 (×3): 10 mg via ORAL
  Filled 2020-05-22 (×3): qty 1

## 2020-05-22 MED ORDER — PANTOPRAZOLE SODIUM 40 MG PO TBEC
40.0000 mg | DELAYED_RELEASE_TABLET | Freq: Every day | ORAL | Status: DC
  Administered 2020-05-22 – 2020-05-24 (×3): 40 mg via ORAL
  Filled 2020-05-22 (×3): qty 1

## 2020-05-22 MED ORDER — HYDROCODONE-ACETAMINOPHEN 10-325 MG PO TABS
1.0000 | ORAL_TABLET | ORAL | Status: DC | PRN
  Administered 2020-05-22: 1 via ORAL
  Filled 2020-05-22 (×2): qty 1

## 2020-05-22 MED ORDER — DEXAMETHASONE SODIUM PHOSPHATE 10 MG/ML IJ SOLN
INTRAMUSCULAR | Status: AC
  Filled 2020-05-22: qty 2

## 2020-05-22 MED ORDER — FLEET ENEMA 7-19 GM/118ML RE ENEM: 1.0000 | ENEMA | Freq: Once | RECTAL | Status: DC | PRN

## 2020-05-22 MED ORDER — SODIUM CHLORIDE 0.9 % IV SOLN: 250.0000 mL | INTRAVENOUS | Status: DC

## 2020-05-22 MED ORDER — FENTANYL CITRATE (PF) 100 MCG/2ML IJ SOLN
INTRAMUSCULAR | Status: AC
  Administered 2020-05-22: 50 ug via INTRAVENOUS
  Filled 2020-05-22: qty 2

## 2020-05-22 MED ORDER — CHLORHEXIDINE GLUCONATE CLOTH 2 % EX PADS: 6.0000 | MEDICATED_PAD | Freq: Once | CUTANEOUS | Status: DC

## 2020-05-22 MED ORDER — FENTANYL CITRATE (PF) 100 MCG/2ML IJ SOLN: 25.0000 ug | INTRAMUSCULAR | Status: DC | PRN

## 2020-05-22 MED ORDER — PROPOFOL 10 MG/ML IV BOLUS
INTRAVENOUS | Status: AC
  Filled 2020-05-22: qty 20

## 2020-05-22 MED ORDER — LIDOCAINE 2% (20 MG/ML) 5 ML SYRINGE
INTRAMUSCULAR | Status: DC | PRN
  Administered 2020-05-22: 100 mg via INTRAVENOUS

## 2020-05-22 MED ORDER — ONDANSETRON HCL 4 MG/2ML IJ SOLN
INTRAMUSCULAR | Status: DC | PRN
  Administered 2020-05-22: 4 mg via INTRAVENOUS

## 2020-05-22 MED ORDER — DEXMEDETOMIDINE HCL 200 MCG/2ML IV SOLN
INTRAVENOUS | Status: DC | PRN
  Administered 2020-05-22 (×5): 4 ug via INTRAVENOUS

## 2020-05-22 MED ORDER — SODIUM CHLORIDE 0.9% FLUSH
3.0000 mL | Freq: Two times a day (BID) | INTRAVENOUS | Status: DC
  Administered 2020-05-23 (×2): 3 mL via INTRAVENOUS

## 2020-05-22 MED ORDER — ROCURONIUM BROMIDE 10 MG/ML (PF) SYRINGE
PREFILLED_SYRINGE | INTRAVENOUS | Status: DC | PRN
  Administered 2020-05-22 (×2): 50 mg via INTRAVENOUS

## 2020-05-22 MED ORDER — BUPIVACAINE HCL (PF) 0.25 % IJ SOLN
INTRAMUSCULAR | Status: DC | PRN
  Administered 2020-05-22: 30 mL

## 2020-05-22 MED ORDER — POLYETHYLENE GLYCOL 3350 17 G PO PACK
17.0000 g | PACK | Freq: Every day | ORAL | Status: DC
  Administered 2020-05-22: 17 g via ORAL
  Filled 2020-05-22 (×2): qty 1

## 2020-05-22 MED ORDER — MIDAZOLAM HCL 2 MG/2ML IJ SOLN
INTRAMUSCULAR | Status: AC
  Filled 2020-05-22: qty 2

## 2020-05-22 MED ORDER — THROMBIN 20000 UNITS EX SOLR
CUTANEOUS | Status: DC | PRN
  Administered 2020-05-22: 20 mL via TOPICAL

## 2020-05-22 MED ORDER — MIDAZOLAM HCL 2 MG/2ML IJ SOLN
INTRAMUSCULAR | Status: DC | PRN
  Administered 2020-05-22: 2 mg via INTRAVENOUS

## 2020-05-22 MED ORDER — PHENOL 1.4 % MT LIQD: 1.0000 | OROMUCOSAL | Status: DC | PRN

## 2020-05-22 MED ORDER — OXYCODONE HCL 5 MG PO TABS
10.0000 mg | ORAL_TABLET | ORAL | Status: DC | PRN
  Administered 2020-05-23 – 2020-05-24 (×9): 10 mg via ORAL
  Filled 2020-05-22 (×9): qty 2

## 2020-05-22 MED ORDER — BUPIVACAINE HCL (PF) 0.25 % IJ SOLN
INTRAMUSCULAR | Status: AC
  Filled 2020-05-22: qty 30

## 2020-05-22 MED ORDER — 0.9 % SODIUM CHLORIDE (POUR BTL) OPTIME
TOPICAL | Status: DC | PRN
  Administered 2020-05-22: 1000 mL

## 2020-05-22 MED ORDER — CEFAZOLIN SODIUM-DEXTROSE 1-4 GM/50ML-% IV SOLN
1.0000 g | Freq: Three times a day (TID) | INTRAVENOUS | Status: AC
  Administered 2020-05-22 – 2020-05-23 (×2): 1 g via INTRAVENOUS
  Filled 2020-05-22 (×2): qty 50

## 2020-05-22 MED ORDER — HYDROMORPHONE HCL 1 MG/ML IJ SOLN
INTRAMUSCULAR | Status: DC | PRN
Start: 2020-05-22 — End: 2020-05-22
  Administered 2020-05-22: .5 mg via INTRAVENOUS

## 2020-05-22 SURGICAL SUPPLY — 98 items
BAG DECANTER FOR FLEXI CONT (MISCELLANEOUS) ×3 IMPLANT
BENZOIN TINCTURE PRP APPL 2/3 (GAUZE/BANDAGES/DRESSINGS) ×3 IMPLANT
BIT DRILL LONG 3.0X30 (BIT) IMPLANT
BIT DRILL LONG 3.0X30MM (BIT)
BIT DRILL LONG 3X80 (BIT) IMPLANT
BIT DRILL LONG 3X80MM (BIT)
BIT DRILL LONG 4X80 (BIT) IMPLANT
BIT DRILL LONG 4X80MM (BIT)
BIT DRILL SHORT 3.0X30 (BIT) IMPLANT
BIT DRILL SHORT 3.0X30MM (BIT)
BIT DRILL SHORT 3X80 (BIT) IMPLANT
BIT DRILL SHORT 3X80MM (BIT)
BLADE CLIPPER SURG (BLADE) IMPLANT
BLADE SURG 11 STRL SS (BLADE) ×3 IMPLANT
BUR CUTTER 7.0 ROUND (BURR) IMPLANT
BUR MATCHSTICK NEURO 3.0 LAGG (BURR) ×3 IMPLANT
CANISTER SUCT 3000ML PPV (MISCELLANEOUS) ×3 IMPLANT
CARTRIDGE OIL MAESTRO DRILL (MISCELLANEOUS) ×1 IMPLANT
CLOSURE STERI-STRIP 1/2X4 (GAUZE/BANDAGES/DRESSINGS) ×1
CLOSURE WOUND 1/2 X4 (GAUZE/BANDAGES/DRESSINGS) ×2
CLSR STERI-STRIP ANTIMIC 1/2X4 (GAUZE/BANDAGES/DRESSINGS) ×2 IMPLANT
CNTNR URN SCR LID CUP LEK RST (MISCELLANEOUS) ×1 IMPLANT
CONT SPEC 4OZ STRL OR WHT (MISCELLANEOUS) ×3
COVER BACK TABLE 60X90IN (DRAPES) ×3 IMPLANT
COVER WAND RF STERILE (DRAPES) ×6 IMPLANT
DECANTER SPIKE VIAL GLASS SM (MISCELLANEOUS) ×3 IMPLANT
DERMABOND ADVANCED (GAUZE/BANDAGES/DRESSINGS) ×2
DERMABOND ADVANCED .7 DNX12 (GAUZE/BANDAGES/DRESSINGS) ×1 IMPLANT
DIFFUSER DRILL AIR PNEUMATIC (MISCELLANEOUS) ×3 IMPLANT
DRAPE C-ARM 42X72 X-RAY (DRAPES) ×15 IMPLANT
DRAPE C-ARMOR (DRAPES) ×3 IMPLANT
DRAPE HALF SHEET 40X57 (DRAPES) ×3 IMPLANT
DRAPE LAPAROTOMY 100X72X124 (DRAPES) ×3 IMPLANT
DRAPE SHEET LG 3/4 BI-LAMINATE (DRAPES) ×3 IMPLANT
DRAPE SURG 17X23 STRL (DRAPES) ×12 IMPLANT
DRSG OPSITE POSTOP 4X8 (GAUZE/BANDAGES/DRESSINGS) ×6 IMPLANT
DURAPREP 26ML APPLICATOR (WOUND CARE) ×3 IMPLANT
ELECT BLADE 4.0 EZ CLEAN MEGAD (MISCELLANEOUS)
ELECT REM PT RETURN 9FT ADLT (ELECTROSURGICAL) ×3
ELECTRODE BLDE 4.0 EZ CLN MEGD (MISCELLANEOUS) IMPLANT
ELECTRODE REM PT RTRN 9FT ADLT (ELECTROSURGICAL) ×1 IMPLANT
EVACUATOR 1/8 PVC DRAIN (DRAIN) ×3 IMPLANT
EXTENDER TAB GUIDE SV 5.5/6.0 (INSTRUMENTS) ×48 IMPLANT
GAUZE 4X4 16PLY RFD (DISPOSABLE) IMPLANT
GAUZE SPONGE 4X4 12PLY STRL (GAUZE/BANDAGES/DRESSINGS) ×3 IMPLANT
GAUZE SPONGE 4X4 16PLY XRAY LF (GAUZE/BANDAGES/DRESSINGS) ×6 IMPLANT
GLOVE BIO SURGEON STRL SZ 6.5 (GLOVE) ×4 IMPLANT
GLOVE BIO SURGEON STRL SZ7 (GLOVE) ×3 IMPLANT
GLOVE BIO SURGEONS STRL SZ 6.5 (GLOVE) ×2
GLOVE BIOGEL PI IND STRL 6.5 (GLOVE) ×2 IMPLANT
GLOVE BIOGEL PI IND STRL 7.5 (GLOVE) ×3 IMPLANT
GLOVE BIOGEL PI INDICATOR 6.5 (GLOVE) ×4
GLOVE BIOGEL PI INDICATOR 7.5 (GLOVE) ×6
GLOVE ECLIPSE 9.0 STRL (GLOVE) ×6 IMPLANT
GLOVE EXAM NITRILE XL STR (GLOVE) IMPLANT
GLOVE SURG SS PI 7.0 STRL IVOR (GLOVE) ×24 IMPLANT
GOWN STRL REUS W/ TWL LRG LVL3 (GOWN DISPOSABLE) ×2 IMPLANT
GOWN STRL REUS W/ TWL XL LVL3 (GOWN DISPOSABLE) ×4 IMPLANT
GOWN STRL REUS W/TWL 2XL LVL3 (GOWN DISPOSABLE) IMPLANT
GOWN STRL REUS W/TWL LRG LVL3 (GOWN DISPOSABLE) ×6
GOWN STRL REUS W/TWL XL LVL3 (GOWN DISPOSABLE) ×12
GRAFT BN 10X1XDBM MAGNIFUSE (Bone Implant) ×1 IMPLANT
GRAFT BN 5X1XSPNE CVD POST DBM (Bone Implant) ×1 IMPLANT
GRAFT BONE MAGNIFUSE 1X10CM (Bone Implant) ×3 IMPLANT
GRAFT BONE MAGNIFUSE 1X5CM (Bone Implant) ×3 IMPLANT
GUIDEWIRE BLUNT NT 450 (WIRE) ×30 IMPLANT
KIT BASIN OR (CUSTOM PROCEDURE TRAY) ×3 IMPLANT
KIT INFUSE MEDIUM (Orthopedic Implant) ×3 IMPLANT
KIT SPINE MAZOR X ROBO DISP (MISCELLANEOUS) ×3 IMPLANT
KIT TURNOVER KIT B (KITS) ×3 IMPLANT
NEEDLE HYPO 22GX1.5 SAFETY (NEEDLE) ×3 IMPLANT
NS IRRIG 1000ML POUR BTL (IV SOLUTION) ×3 IMPLANT
OIL CARTRIDGE MAESTRO DRILL (MISCELLANEOUS) ×3
PACK LAMINECTOMY NEURO (CUSTOM PROCEDURE TRAY) ×3 IMPLANT
PIN HEAD 2.5X60MM (PIN) IMPLANT
ROD PERC AL 5.5X110 (Rod) ×3 IMPLANT
ROD PERC TI 5.5X100 (Rod) ×3 IMPLANT
SCREW 6.5X40 VOYAGER MAS FNS (Screw) ×6 IMPLANT
SCREW CANN MA SOLERA 8.5X70 (Screw) ×6 IMPLANT
SCREW FENS MAS CCM 7.5X40 (Screw) ×6 IMPLANT
SCREW MAS FENS 6.5 45 (Screw) ×4 IMPLANT
SCREW MAS FENS 6.5X45 (Screw) ×12 IMPLANT
SCREW SCHANZ SA 4.0MM (MISCELLANEOUS) IMPLANT
SCREW SET 5.5/6.0MM SOLERA (Screw) ×24 IMPLANT
SCREW SET SOLERA (Screw) ×6 IMPLANT
SCREW SET SOLERA TI5.5 (Screw) ×2 IMPLANT
SPONGE SURGIFOAM ABS GEL 100 (HEMOSTASIS) ×3 IMPLANT
STRIP CLOSURE SKIN 1/2X4 (GAUZE/BANDAGES/DRESSINGS) ×4 IMPLANT
SUT VIC AB 0 CT1 18XCR BRD8 (SUTURE) ×4 IMPLANT
SUT VIC AB 0 CT1 8-18 (SUTURE) ×12
SUT VIC AB 2-0 CT1 18 (SUTURE) ×3 IMPLANT
SUT VIC AB 3-0 SH 8-18 (SUTURE) ×9 IMPLANT
TOWEL GREEN STERILE (TOWEL DISPOSABLE) ×3 IMPLANT
TOWEL GREEN STERILE FF (TOWEL DISPOSABLE) ×3 IMPLANT
TRAY FOLEY MTR SLVR 14FR STAT (SET/KITS/TRAYS/PACK) ×3 IMPLANT
TRAY FOLEY MTR SLVR 16FR STAT (SET/KITS/TRAYS/PACK) IMPLANT
TUBE MAZOR SA REDUCTION (TUBING) ×3 IMPLANT
WATER STERILE IRR 1000ML POUR (IV SOLUTION) ×3 IMPLANT

## 2020-05-22 NOTE — H&P (Signed)
Chief Complaint: Back pain HPI: 59 year old male with a complex spinal history.  Patient status post prior anterior and posterior cervical decompressive surgery for treatment of a severe compressive cervical myelopathy.  Patient also status post lumbosacral decompression and later decompression and fusion.  Decompression and fusion at L4-5 and L5-S1 complicated by pseudoarthrosis and increasing pain.  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  Patient is status post removal of hardware at L4-5 and S1.  His pain is persisted.  Imaging demonstrates marked progression of his adjacent level degeneration at L3-4.  There is continued reasonably stable but nonunion at L4-5 and L5S1.  The patient's imaging is worrisome for discitis at L3-4 but laboratory studies have been negative.  Plan is for patient to undergo reexploration of his lumbosacral fusion with revision instrumentation and new pelvic fixation in hopes of improving situation.      Past Medical History:  Diagnosis Date  . Anxiety   . Arthritis   . Constipation due to opioid therapy   . Depression    due to pain  . History of kidney stones    history of previous kidney stones, several   . Neuropathy          Past Surgical History:  Procedure Laterality Date  . ANTERIOR CERVICAL DECOMP/DISCECTOMY FUSION  10/15/2017   Dr. Jordan Likes  . HARDWARE REMOVAL N/A 12/09/2019   Procedure: Removal of Lumbar Four, Lumbar Five and Sacral One pedicle screws;  Surgeon: Julio Sicks, MD;  Location: [facility] OR;  Service: Neurosurgery;  Laterality: N/A;  Removal of Lumbar Four, Lumbar Five and Sacral One pedicle screws  . JOINT REPLACEMENT Left    Knee  . posterior cervical disc fusion  02/26/2017  . ROTATOR CUFF REPAIR Bilateral   . THORACIC DISC SURGERY     2017, 2 ruptured discs, had emergency surgery    History reviewed. No pertinent family history. Social History:  reports that he has never smoked. His smokeless  tobacco use includes chew. He reports previous alcohol use. He reports previous drug use.  Allergies: No Known Allergies        Medications Prior to Admission  Medication Sig Dispense Refill  . acetaminophen (TYLENOL) 500 MG tablet Take 1,000 mg by mouth every 8 (eight) hours as needed for moderate pain.     . Calcium-Magnesium-Zinc (CAL-MAG-ZINC PO) Take 1 tablet by mouth daily.    . Cholecalciferol (VITAMIN D3 PO) Take 1 capsule by mouth daily.    . diazepam (VALIUM) 5 MG tablet Take 1-2 tablets (5-10 mg total) by mouth every 6 (six) hours as needed for muscle spasms. (Patient taking differently: Take 5 mg by mouth every 8 (eight) hours as needed for muscle spasms. ) 30 tablet 0  . diphenhydramine-acetaminophen (TYLENOL PM) 25-500 MG TABS tablet Take 2 tablets by mouth at bedtime.    . docusate sodium (COLACE) 100 MG capsule Take 200 mg by mouth daily.     . DULoxetine (CYMBALTA) 60 MG capsule Take 60 mg by mouth daily.    Marland Kitchen FLUoxetine (PROZAC) 10 MG capsule Take 10 mg by mouth daily.    . meloxicam (MOBIC) 15 MG tablet Take 15 mg by mouth daily.    . Misc Natural Products (GLUCOSAMINE CHOND MSM FORMULA PO) Take 1 tablet by mouth daily.    . Multiple Vitamins-Minerals (MULTIVITAMIN WITH MINERALS) tablet Take 1 tablet by mouth daily. Mens    . Omega-3 Fatty Acids (FISH OIL PO) Take 1 capsule by mouth daily.    Marland Kitchen  omeprazole (PRILOSEC) 40 MG capsule Take 40 mg by mouth daily.    . Oxycodone HCl 10 MG TABS Take 1 tablet (10 mg total) by mouth every 6 (six) hours as needed ((score 7 to 10)). 40 tablet 0  . polyethylene glycol (MIRALAX / GLYCOLAX) 17 g packet Take 17 g by mouth daily.    . Probiotic Product (PROBIOTIC PO) Take 1 capsule by mouth daily.      Lab Results Last 48 Hours  No results found for this or any previous visit (from the past 48 hour(s)).   Imaging Results (Last 48 hours)  No results found.    Pertinent items noted in HPI and remainder  of comprehensive ROS otherwise negative.  Blood pressure 125/87, pulse (!) 59, temperature 98.3 F (36.8 C), temperature source Oral, resp. rate 20, height 5\' 11"  (1.803 m), weight 89 kg, SpO2 97 %.  Patient is awake and alert.  He is oriented and appropriate.  Speech is fluent.  Judgment insight are intact.  Cranial nerve function normal bilateral.  Motor examination extremities reveals 4+/5 strength in both grips and intrinsics.  He has 4+/5 strength in both lower extremities with increased tone and spasticity.  Gait is spastic.  Sensor examination some decrease sensation pinprick light touch distally in both lower extremities.  Examination head ears eyes nose throat is unremarkable chest and abdomen benign.  Extremities are free from injury deformity. 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  Plan L2-3-4 5 S1 and S2 alar iliac pedicle screw fixation with posterior lateral fusion utilizing local autograft and morselized allograft and bone morphogenic protein.  We will also use the stereotactic robot for assistance.  Risks and benefits been explained.  Patient wishes to proceed.  A Josephus Harriger 05/15/2020, 7:57 AM

## 2020-05-22 NOTE — Addendum Note (Signed)
Addendum  created 05/22/20 1623 by Malkia Nippert, MD   Order list changed    

## 2020-05-22 NOTE — Op Note (Signed)
Date of procedure: 2020-05-22  Date of dictation: Same  Service: Neurosurgery  Preoperative diagnosis: L4-5, L5-S1 pseudoarthrosis, L2-3 severe adjacent level disc degeneration with instability, L3-4 severe adjacent disc degeneration with instability  Postoperative diagnosis: Same  Procedure Name: L2, L3, L4, L5, S1 posterior lateral arthrodesis, revision utilizing morselized allograft, local autograft, and bone morphogenic protein  Segmental pedicle screw instrumentation L2-S1 with iliac fixation via S2 alar iliac wing screws  Intraoperative stereotactic guidance via Mazor robot  Surgeon:Taylyn Brame A.Renlee Floor, M.D.  Asst. Surgeon: Doran Durand, NP  Anesthesia: General  Indication: 59 year old male status post prior L4-5 and L5-S1 decompression and fusion with subsequent fracture into his superior endplate of L4 with severe adjacent level disc destruction at L3-4 and some hardware loosening.  Patient status post prior removal of L4 to sacrum instrumentation with augmentation of his fusion which was unsuccessful in relieving all of his symptoms.  Patient with progressive disc degeneration at L3-4 worrisome for discitis but not consistent with discitis by laboratory studies.  L2-3 with significant disc degeneration.  Patient presents now for revision lumbosacral pelvic fusion utilizing instrumentation and the intraoperative robot.  Operative note: After induction of anesthesia, patient position prone onto a Jackson table where he was appropriately padded.  Patient's lumbar sacral region was prepped and draped sterilely.  A registration pin was then placed into the posterior superior iliac spine and the Mazor robotic guidance arm was attached to this point of fixation.  Registration fluoroscopy was then used to allow for stereotactic guidance using the robot.  After the spine was registered and pedicle screw path planning had been performed the patient then underwent percutaneous placement of multiple pedicle  screws.  Starting first at L2 and L3 stab incisions were made.  The robotic arm was docked.  Pilot hole was drilled and K wires were left in the pedicles of L2 and L3.  The pedicle of L4 was largely destroyed and I did not feel that safe passage could be performed into this pedicle.  Pedicles of L5-S1 were then performed in a similar fashion again with good positioning of the K wires resulting.  Iliac wing screws were then placed VNS to alar ilium trajectory.  Pilot holes were drilled using the Mazor robot.  Guidewires were placed.  The fluoroscopy unit was brought in and the guidewires were confirmed be in good position of the pedicles at L2-L3 L5 and S1 and the S2 alar iliac screw wires were well positioned.  Medtronic 6.53mm Solera screws were then placed bilaterally at L2-L3 and L5.  7.5 millimeters screws were placed bilaterally at S1 and 8.39mm bolts were placed into the S2 alar iliac wings.  All screws had good purchase and were tightened down.  A rod was then passed through the screw head towers from L2-S1 and then passed under direct vision into the iliac wing screw.  Locking caps and placed over the screw heads.  Locking caps were then engaged with a construct and compression.  The towers were removed.  Final images reveal good position the screws and the rods at the proper operative level with improved alignment of the spine and no evidence of complicating features.  The transverse processes of L2 L3-L4-L5 and the sacrum were decorticated.  Morselized autograft and allograft packets and bone morphogenic protein soaked sponges were packed posterior laterally for fusion.  There was no indication of any infection at L3-4.  Wound was then irrigated one final time.  Wounds and closed in layers of Vicryl sutures.  Steri-Strips and  sterile dressing were applied.  No apparent complications.  Patient tolerated the procedure well and he returns to the recovery room postop.

## 2020-05-22 NOTE — Anesthesia Postprocedure Evaluation (Signed)
Anesthesia Post Note  Patient: Chase Gonzalez  Procedure(s) Performed: Posterior lateral fusion - Lumbar two-Sacral one with Sacral two AIar/iIliac screws - Mazor (N/A Back) APPLICATION OF ROBOTIC ASSISTANCE FOR SPINAL PROCEDURE (N/A Back)     Patient location during evaluation: PACU Anesthesia Type: General Level of consciousness: awake Pain management: pain level controlled Vital Signs Assessment: post-procedure vital signs reviewed and stable Respiratory status: spontaneous breathing Cardiovascular status: stable Postop Assessment: no apparent nausea or vomiting Anesthetic complications: no   No complications documented.  Last Vitals:  Vitals:   05/22/20 1530 05/22/20 1545  BP: 102/67 105/69  Pulse: 70 79  Resp: 11 19  Temp: 36.8 C   SpO2: 100% 98%    Last Pain:  Vitals:   05/22/20 1530  TempSrc:   PainSc: Asleep                 Blanch Stang

## 2020-05-22 NOTE — Progress Notes (Signed)
Orthopedic Tech Progress Note Patient Details:  Chase Gonzalez September 07, 1960 [id number] Patient has brace Patient ID: Chase Gonzalez, male   DOB: 1961/05/19, 59 y.o.   MRN: [id number]   Michelle Piper 05/22/2020, 7:45 PM

## 2020-05-22 NOTE — Anesthesia Procedure Notes (Signed)
Procedure Name: Intubation Date/Time: 05/22/2020 11:15 AM Performed by: Dairl Ponder, CRNA Pre-anesthesia Checklist: Patient identified, Emergency Drugs available, Suction available and Patient being monitored Patient Re-evaluated:Patient Re-evaluated prior to induction Oxygen Delivery Method: Circle System Utilized Preoxygenation: Pre-oxygenation with 100% oxygen Induction Type: IV induction Ventilation: Mask ventilation without difficulty Laryngoscope Size: Miller and 2 Grade View: Grade I Tube type: Oral Tube size: 7.5 mm Number of attempts: 1 Airway Equipment and Method: Stylet and Oral airway Placement Confirmation: ETT inserted through vocal cords under direct vision,  positive ETCO2 and breath sounds checked- equal and bilateral Secured at: 22 cm Tube secured with: Tape Dental Injury: Teeth and Oropharynx as per pre-operative assessment

## 2020-05-22 NOTE — Anesthesia Preprocedure Evaluation (Signed)
Anesthesia Evaluation  Patient identified by MRN, date of birth, ID band Patient awake    Reviewed: Allergy & Precautions, NPO status , Patient's Chart, lab work & pertinent test results  Airway Mallampati: II  TM Distance: >3 FB     Dental   Pulmonary    breath sounds clear to auscultation       Cardiovascular  Rhythm:Regular Rate:Normal  History noted. CG   Neuro/Psych    GI/Hepatic negative GI ROS, Neg liver ROS,   Endo/Other  negative endocrine ROS  Renal/GU negative Renal ROS     Musculoskeletal   Abdominal   Peds  Hematology   Anesthesia Other Findings   Reproductive/Obstetrics                             Anesthesia Physical Anesthesia Plan  ASA: II  Anesthesia Plan: General   Post-op Pain Management:    Induction: Intravenous  PONV Risk Score and Plan: Ondansetron, Dexamethasone and Midazolam  Airway Management Planned: Oral ETT  Additional Equipment:   Intra-op Plan:   Post-operative Plan: Possible Post-op intubation/ventilation  Informed Consent: I have reviewed the patients History and Physical, chart, labs and discussed the procedure including the risks, benefits and alternatives for the proposed anesthesia with the patient or authorized representative who has indicated his/her understanding and acceptance.     Dental advisory given  Plan Discussed with: CRNA and Anesthesiologist  Anesthesia Plan Comments:         Anesthesia Quick Evaluation

## 2020-05-22 NOTE — Transfer of Care (Signed)
Immediate Anesthesia Transfer of Care Note  Patient: Chase Gonzalez  Procedure(s) Performed: Posterior lateral fusion - Lumbar two-Sacral one with Sacral two AIar/iIliac screws - Mazor (N/A Back) APPLICATION OF ROBOTIC ASSISTANCE FOR SPINAL PROCEDURE (N/A Back)  Patient Location: PACU  Anesthesia Type:General  Level of Consciousness: sedated  Airway & Oxygen Therapy: Patient Spontanous Breathing and Patient connected to face mask oxygen  Post-op Assessment: Report given to RN and Post -op Vital signs reviewed and stable  Post vital signs: Reviewed and stable  Last Vitals:  Vitals Value Taken Time  BP 102/67 05/22/20 1530  Temp    Pulse 73 05/22/20 1533  Resp 20 05/22/20 1533  SpO2 100 % 05/22/20 1533  Vitals shown include unvalidated device data.  Last Pain:  Vitals:   05/22/20 0906  TempSrc:   PainSc: 5          Complications: No complications documented.

## 2020-05-22 NOTE — Brief Op Note (Signed)
05/22/2020  3:05 PM  PATIENT:  Chase Gonzalez  59 y.o. male  PRE-OPERATIVE DIAGNOSIS:  Pseudoarthrosis  POST-OPERATIVE DIAGNOSIS:  Pseudoarthrosis  PROCEDURE:  Procedure(s): Posterior lateral fusion - Lumbar two-Sacral one with Sacral two AIar/iIliac screws - Mazor (N/A) APPLICATION OF ROBOTIC ASSISTANCE FOR SPINAL PROCEDURE (N/A)  SURGEON:  Surgeon(s) and Role:    * Julio Sicks, MD - Primary  PHYSICIAN ASSISTANT:   ASSISTANTSMarland Mcalpine   ANESTHESIA:   general  EBL:  250 mL   BLOOD ADMINISTERED:none  DRAINS: none   LOCAL MEDICATIONS USED:  MARCAINE     SPECIMEN:  No Specimen  DISPOSITION OF SPECIMEN:  N/A  COUNTS:  YES  TOURNIQUET:  * No tourniquets in log *  DICTATION: .Dragon Dictation  PLAN OF CARE: Admit to inpatient   PATIENT DISPOSITION:  PACU - hemodynamically stable.   Delay start of Pharmacological VTE agent (>24hrs) due to surgical blood loss or risk of bleeding: yes

## 2020-05-23 ENCOUNTER — Encounter (HOSPITAL_COMMUNITY): Payer: Self-pay | Admitting: Neurosurgery

## 2020-05-23 MED FILL — Heparin Sodium (Porcine) Inj 1000 Unit/ML: INTRAMUSCULAR | Qty: 30 | Status: AC

## 2020-05-23 MED FILL — Sodium Chloride IV Soln 0.9%: INTRAVENOUS | Qty: 1000 | Status: AC

## 2020-05-23 NOTE — Progress Notes (Signed)
Postop day 1.  Back pain very much improved.  Patient notes some tightness and discomfort in his right knee.  Symptoms appear to be more related to his quadricep tendon than true radicular symptoms.  No new numbness paresthesias or weakness.  Motor and sensory examination stable.  Patient still with significant spasticity in both lower extremities but motor strength is good bilaterally.  Wound clean and dry.  Chest and abdomen benign.  Overall progressing well.  Mobilize today.  Likely home tomorrow.

## 2020-05-23 NOTE — Evaluation (Signed)
Occupational Therapy Evaluation Patient Details Name: Chase Gonzalez MRN: [id number] DOB: 09-04-1960 Today's Date: 05/23/2020    History of Present Illness 59 yo male s/p L2-S1 with scaral two alar/illiac screws PMH arthritis depression kidney stones neuropathy ACDF 10/2019 Hardware removal 11/2019 LTKA  Posterior cervical fusion 02/2027 BIl rotator cuff   Clinical Impression   Patient is s/p L2-s1 surgery resulting in functional limitations due to the deficits listed below (see OT problem list). Pt currently with R LE movement that affects his ability to do any static standing. Pt reports this is baseline prior to surgery but the pain is now R side not L side. Pt unable to sustain heel down or demonstrates a clonus like movement.  Patient will benefit from skilled OT acutely to increase independence and safety with ADLS to allow discharge hhot.     Follow Up Recommendations  Home health OT    Equipment Recommendations  None recommended by OT;Other (comment) (provided an extra wide sock aide)    Recommendations for Other Services Other (comment) (pt prefers home not CIR)     Precautions / Restrictions Precautions Precautions: Back Precaution Comments: handout reviewed for adls brace reviewed for don doff Required Braces or Orthoses: Spinal Brace Spinal Brace: Applied in sitting position;Lumbar corset Restrictions Weight Bearing Restrictions: No      Mobility Bed Mobility Overal bed mobility: Modified Independent             General bed mobility comments: min cues for back precautions . attempting to complete supine to sit in long sitting     Transfers Overall transfer level: Needs assistance Equipment used: Rolling walker (2 wheeled) Transfers: Sit to/from Stand Sit to Stand: Min guard              Balance Overall balance assessment: Needs assistance         Standing balance support: Single extremity supported;During functional activity Standing  balance-Leahy Scale: Poor                             ADL either performed or assessed with clinical judgement   ADL Overall ADL's : Needs assistance/impaired Eating/Feeding: Modified independent   Grooming: Oral care;Moderate assistance;Standing Grooming Details (indicate cue type and reason): recommend seated position due to mod (A) required with Clonus like movement R LE. pt unable to sustain static standing with tremor movement. pt needed mod cues for cup use not bending               Lower Body Dressing Details (indicate cue type and reason): provided a wide sock aide as pt has hip kit at home and uses it daily Toilet Transfer: Moderate assistance;RW           Functional mobility during ADLs: Moderate assistance;Rolling walker General ADL Comments: pt was able to progress to the bathroom min guard (A) but required increased (A) due to safety exiting bathroom with R LE changes      Vision Baseline Vision/History: Wears glasses Wears Glasses: At all times       Perception     Praxis      Pertinent Vitals/Pain Pain Assessment: 0-10 Pain Score: 5  Pain Location: R LE - really hurt post movement Pain Descriptors / Indicators: Discomfort;Grimacing;Cramping;Guarding Pain Intervention(s): Monitored during session;Premedicated before session;Repositioned     Hand Dominance Right   Extremity/Trunk Assessment     Lower Extremity Assessment Lower Extremity Assessment: RLE deficits/detail RLE Deficits / Details:  unable to static stand with R LE heel down. pt with a clonus like reaction to placement of heel and requires (A) for standing   Cervical / Trunk Assessment Cervical / Trunk Assessment: Other exceptions Cervical / Trunk Exceptions: s/p surg   Communication Communication Communication: No difficulties   Cognition Arousal/Alertness: Awake/alert Behavior During Therapy: WFL for tasks assessed/performed Overall Cognitive Status: Within Functional  Limits for tasks assessed                                 General Comments: requires cues for back precautions during functional activity but able to recall precautions when asked directly   General Comments  incision dry and intact at this time. pt very high fall risk due to R LE at this time    Exercises     Shoulder Instructions      Home Living Family/patient expects to be discharged to:: Private residence Living Arrangements: Spouse/significant other;Children Available Help at Discharge: Family Type of Home: House             Bathroom Shower/Tub: Walk-in shower   Bathroom Toilet: Handicapped height     Home Equipment: Grab bars - toilet;Grab bars - tub/shower   Additional Comments: has a boxer/lab dog named Actor       Prior Functioning/Environment                   OT Problem List: Decreased activity tolerance;Impaired balance (sitting and/or standing);Decreased safety awareness;Decreased knowledge of use of DME or AE;Decreased knowledge of precautions;Pain      OT Treatment/Interventions: Self-care/ADL training;Therapeutic exercise;Neuromuscular education;Energy conservation;DME and/or AE instruction;Manual therapy;Therapeutic activities;Patient/family education;Balance training    OT Goals(Current goals can be found in the care plan section) Acute Rehab OT Goals Patient Stated Goal: to go home OT Goal Formulation: With patient Time For Goal Achievement: 06/06/20 Potential to Achieve Goals: Good  OT Frequency: Min 2X/week   Barriers to D/C:            Co-evaluation              AM-PAC OT "6 Clicks" Daily Activity     Outcome Measure Help from another person eating meals?: None Help from another person taking care of personal grooming?: A Little Help from another person toileting, which includes using toliet, bedpan, or urinal?: A Little Help from another person bathing (including washing, rinsing, drying)?: A Lot Help from  another person to put on and taking off regular upper body clothing?: A Lot Help from another person to put on and taking off regular lower body clothing?: A Little 6 Click Score: 17   End of Session Equipment Utilized During Treatment: Rolling walker;Back brace Nurse Communication: Mobility status;Precautions  Activity Tolerance: Patient tolerated treatment well Patient left: in bed;with call bell/phone within reach  OT Visit Diagnosis: Unsteadiness on feet (R26.81);Muscle weakness (generalized) (M62.81)                Time: 6962-9528 OT Time Calculation (min): 20 min Charges:  OT General Charges $OT Visit: 1 Visit OT Evaluation $OT Eval Moderate Complexity: 1 Mod   Brynn, OTR/L  Acute Rehabilitation Services Pager: [phone] Office: [phone] .   Jeri Modena 05/23/2020, 12:15 PM

## 2020-05-23 NOTE — Evaluation (Signed)
Physical Therapy Evaluation Patient Details Name: Chase Gonzalez MRN: [id number] DOB: 1961/01/05 Today's Date: 05/23/2020   History of Present Illness  59 yo male s/p L2-S1 with scaral two alar/illiac screws PMH arthritis depression kidney stones neuropathy ACDF 10/2019 Hardware removal 11/2019 LTKA  Posterior cervical fusion 02/2027 BIl rotator cuff    Clinical Impression  Pt admitted with above diagnosis. At the time of PT eval, pt was able to demonstrate transfers and ambulation with gross min guard assist to min assist with RW for support. Pt was educated on precautions, brace application/wearing schedule, appropriate activity progression, and car transfer. Pt currently with functional limitations due to the deficits listed below (see PT Problem List). Pt will benefit from skilled PT to increase their independence and safety with mobility to allow discharge to the venue listed below.      Follow Up Recommendations Supervision for mobility/OOB;Home health PT    Equipment Recommendations  Rolling walker with 5" wheels    Recommendations for Other Services       Precautions / Restrictions Precautions Precautions: Back Precaution Comments: handout reviewed for adls brace reviewed for don doff Required Braces or Orthoses: Spinal Brace Spinal Brace: Applied in sitting position;Lumbar corset Restrictions Weight Bearing Restrictions: No      Mobility  Bed Mobility Overal bed mobility: Modified Independent             General bed mobility comments: VC's for proper log roll technique.     Transfers Overall transfer level: Needs assistance Equipment used: Rolling walker (2 wheeled) Transfers: Sit to/from Stand Sit to Stand: Min guard         General transfer comment: Close guard for safety as pt powered up to full stand. Increased time due to pain. Minor knee buckle but able to recover without assistance.   Ambulation/Gait Ambulation/Gait assistance: Min guard Gait  Distance (Feet): 75 Feet Assistive device: Rolling walker (2 wheeled) Gait Pattern/deviations: Step-through pattern;Decreased stride length;Decreased weight shift to right;Trunk flexed Gait velocity: Decreased Gait velocity interpretation: <1.8 ft/sec, indicate of risk for recurrent falls General Gait Details: VC's for improved posture, closer walker clearance, forward gaze, and increased heel strike on the R. Pt moaning due to R knee pain throughout gait training, but was able to ambulate well with no significant loss of balance or knee buckle.   Stairs            Wheelchair Mobility    Modified Rankin (Stroke Patients Only)       Balance Overall balance assessment: Needs assistance Sitting-balance support: Feet supported;No upper extremity supported Sitting balance-Leahy Scale: Fair     Standing balance support: During functional activity;Bilateral upper extremity supported Standing balance-Leahy Scale: Poor Standing balance comment: Reliant on RW for support                             Pertinent Vitals/Pain Pain Assessment: 0-10 Pain Score: 5  Pain Location: lateral aspect of R knee Pain Descriptors / Indicators: Discomfort;Grimacing;Cramping;Guarding Pain Intervention(s): Limited activity within patient's tolerance;Monitored during session;Repositioned    Home Living Family/patient expects to be discharged to:: Private residence Living Arrangements: Spouse/significant other;Children Available Help at Discharge: Family Type of Home: House Home Access: Stairs to enter   Secretary/administrator of Steps: 1 Home Layout: One level Home Equipment: Grab bars - toilet;Grab bars - tub/shower;Walker - 4 wheels Additional Comments: has a boxer/lab dog named Rayna Sexton     Prior Function Level of Independence: Independent  with assistive device(s)         Comments: Using rollator PTA     Hand Dominance   Dominant Hand: Right    Extremity/Trunk Assessment    Upper Extremity Assessment Upper Extremity Assessment: Defer to OT evaluation    Lower Extremity Assessment Lower Extremity Assessment: RLE deficits/detail RLE Deficits / Details: Difficulty getting heel down initially but was able to eventually get knee straight and weight shift to the R side     Cervical / Trunk Assessment Cervical / Trunk Assessment: Other exceptions Cervical / Trunk Exceptions: s/p surgery  Communication   Communication: No difficulties  Cognition Arousal/Alertness: Awake/alert Behavior During Therapy: WFL for tasks assessed/performed Overall Cognitive Status: Within Functional Limits for tasks assessed                                 General Comments: requires cues for back precautions during functional activity but able to recall precautions when asked directly      General Comments General comments (skin integrity, edema, etc.): incision dry and intact at this time. pt very high fall risk due to R LE at this time    Exercises     Assessment/Plan    PT Assessment Patient needs continued PT services  PT Problem List Decreased strength;Decreased activity tolerance;Decreased balance;Decreased mobility;Decreased knowledge of use of DME;Decreased safety awareness;Decreased knowledge of precautions;Pain       PT Treatment Interventions DME instruction;Gait training;Functional mobility training;Therapeutic activities;Therapeutic exercise;Stair training;Neuromuscular re-education;Patient/family education;Balance training    PT Goals (Current goals can be found in the Care Plan section)  Acute Rehab PT Goals Patient Stated Goal: to go home PT Goal Formulation: With patient Time For Goal Achievement: 05/30/20 Potential to Achieve Goals: Good    Frequency Min 5X/week   Barriers to discharge        Co-evaluation               AM-PAC PT "6 Clicks" Mobility  Outcome Measure Help needed turning from your back to your side while in a  flat bed without using bedrails?: None Help needed moving from lying on your back to sitting on the side of a flat bed without using bedrails?: None Help needed moving to and from a bed to a chair (including a wheelchair)?: A Little Help needed standing up from a chair using your arms (e.g., wheelchair or bedside chair)?: A Little Help needed to walk in hospital room?: A Little Help needed climbing 3-5 steps with a railing? : A Lot 6 Click Score: 19    End of Session Equipment Utilized During Treatment: Gait belt;Back brace Activity Tolerance: Patient limited by pain;Patient limited by fatigue Patient left: in bed;with call bell/phone within reach Nurse Communication: Mobility status PT Visit Diagnosis: Unsteadiness on feet (R26.81);Pain;Other symptoms and signs involving the nervous system (R29.898) Pain - Right/Left: Right Pain - part of body: Knee (back)    Time: 4268-3419 PT Time Calculation (min) (ACUTE ONLY): 29 min   Charges:   PT Evaluation $PT Eval Moderate Complexity: 1 Mod PT Treatments $Gait Training: 8-22 mins        Conni Slipper, PT, DPT Acute Rehabilitation Services Pager: [phone] Office: [phone]   Marylynn Pearson 05/23/2020, 1:41 PM

## 2020-05-23 NOTE — TOC Initial Note (Signed)
Transition of Care [facility]) - Initial/Assessment Note    Patient Details  Name: Chase Gonzalez MRN: [id number] Date of Birth: [birth year]  Transition of Care [facility]) CM/SW Contact:    Lawerance Sabal, RN Phone Number: 05/23/2020, 12:19 PM  Clinical Narrative:        Sherron Monday w patient at bedside. He is from home w wife. Recs for hh PT OT. Patient is agreeable to [facility] services.   Frances Furbish has accepted referral.Patient will need HH PT OT orders. DME to be provided by nursing staff. No other CM needs identified.         Expected Discharge Plan: Home w Home Health Services Barriers to Discharge: Continued Medical Work up   Patient Goals and CMS Choice Patient states their goals for this hospitalization and ongoing recovery are:: to go home CMS Medicare.gov Compare Post Acute Care list provided to:: Patient Choice offered to / list presented to : Patient  Expected Discharge Plan and Services Expected Discharge Plan: Home w Home Health Services   Discharge Planning Services: CM Consult Post Acute Care Choice: Home Health, Durable Medical Equipment Living arrangements for the past 2 months: Single Family Home                           HH Arranged: PT, OT [facility] Agency: [facility] Date [facility] Agency Contacted: 05/23/20 Time HH Agency Contacted: [id number] Representative spoke with at [facility] Agency: Kandee Keen  Prior Living Arrangements/Services Living arrangements for the past 2 months: Single Family Home Lives with:: Spouse                   Activities of Daily Living      Permission Sought/Granted                  Emotional Assessment              Admission diagnosis:  Lumbar pseudoarthrosis [S32.009K] Patient Active Problem List   Diagnosis Date Noted  . Lumbar pseudoarthrosis 05/22/2020  . Painful orthopaedic hardware (HCC) 12/09/2019  . Postoperative pain 01/09/2019  . Post-operative pain 01/08/2019  . Hospice care patient 12/29/2018  . Palliative care patient  12/29/2018  . Degenerative spondylolisthesis 12/28/2018  . Trochanteric bursitis, left hip 01/15/2017   PCP:  Judge Stall, MD Pharmacy:   [facility] - [place], [place] - [address] [place] [address] Phone: [phone] Fax: [phone]     Social Determinants of Health (SDOH) Interventions    Readmission Risk Interventions No flowsheet data found.

## 2020-05-24 MED ORDER — DOXYCYCLINE HYCLATE 50 MG PO CAPS: 50.0000 mg | ORAL_CAPSULE | Freq: Two times a day (BID) | ORAL | 0 refills | Status: DC

## 2020-05-24 MED ORDER — OXYCODONE HCL 10 MG PO TABS: 10.0000 mg | ORAL_TABLET | ORAL | 0 refills | Status: DC | PRN

## 2020-05-24 MED ORDER — DIAZEPAM 5 MG PO TABS
5.0000 mg | ORAL_TABLET | Freq: Four times a day (QID) | ORAL | 0 refills | Status: DC | PRN
Start: 2020-05-24 — End: 2022-02-14

## 2020-05-24 NOTE — Discharge Instructions (Signed)

## 2020-05-24 NOTE — Discharge Summary (Signed)
Physician Discharge Summary  Patient ID: Chase Gonzalez MRN: [id number] DOB/AGE: 1961-06-15 59 y.o.  Admit date: 05/22/2020 Discharge date: 05/24/2020  Admission Diagnoses:  Discharge Diagnoses:  Active Problems:   Lumbar pseudoarthrosis   Discharged Condition: good  Hospital Course: Patient mated to the hospital where he underwent uncomplicated revision lumbar sacral pelvic fusion.  Postoperatively doing reasonably well.  Preoperative back pain improved.  Patient with some muscular right anterior thigh pain which does not appear to be radicular.  No motor or sensory dysfunction.  Ambulating well.  Ready for discharge home.  Consults:   Significant Diagnostic Studies:   Treatments:   Discharge Exam: Blood pressure 110/75, pulse 93, temperature 98.3 F (36.8 C), temperature source Oral, resp. rate 18, height 5\' 11"  (1.803 m), weight 89 kg, SpO2 93 %. Awake and alert.  Oriented and appropriate.  Motor and sensory function stable.  Wound clean and dry.  Chest and abdomen benign.  Disposition: Discharge disposition: 01-Home or Self Care        Allergies as of 05/24/2020   No Known Allergies     Medication List    TAKE these medications   acetaminophen 500 MG tablet Commonly known as: TYLENOL Take 1,000 mg by mouth every 8 (eight) hours as needed for moderate pain.   CAL-MAG-ZINC PO Take 1 tablet by mouth daily.   diazepam 5 MG tablet Commonly known as: VALIUM Take 1 tablet (5 mg total) by mouth every 8 (eight) hours as needed for muscle spasms. What changed: Another medication with the same name was added. Make sure you understand how and when to take each.   diazepam 5 MG tablet Commonly known as: VALIUM Take 1-2 tablets (5-10 mg total) by mouth every 6 (six) hours as needed for muscle spasms. What changed: You were already taking a medication with the same name, and this prescription was added. Make sure you understand how and when to take each.    diphenhydramine-acetaminophen 25-500 MG Tabs tablet Commonly known as: TYLENOL PM Take 2 tablets by mouth at bedtime.   docusate sodium 100 MG capsule Commonly known as: COLACE Take 200 mg by mouth daily.   doxycycline 50 MG capsule Commonly known as: VIBRAMYCIN Take 1 capsule (50 mg total) by mouth 2 (two) times daily.   DULoxetine 60 MG capsule Commonly known as: CYMBALTA Take 60 mg by mouth daily.   FISH OIL PO Take 1 capsule by mouth daily.   FLUoxetine 10 MG capsule Commonly known as: PROZAC Take 10 mg by mouth daily.   GLUCOSAMINE CHOND MSM FORMULA PO Take 1 tablet by mouth daily.   meloxicam 15 MG tablet Commonly known as: MOBIC Take 15 mg by mouth daily.   multivitamin with minerals tablet Take 1 tablet by mouth daily. Mens   omeprazole 40 MG capsule Commonly known as: PRILOSEC Take 40 mg by mouth daily.   Oxycodone HCl 10 MG Tabs Take 1 tablet (10 mg total) by mouth every 3 (three) hours as needed for severe pain ((score 7 to 10)). What changed:   when to take this  reasons to take this   polyethylene glycol 17 g packet Commonly known as: MIRALAX / GLYCOLAX Take 17 g by mouth daily.   PROBIOTIC PO Take 1 capsule by mouth daily.   VITAMIN D3 PO Take 1 capsule by mouth daily.            Durable Medical Equipment  (From admission, onward)         Start  Ordered   05/22/20 1813  DME Walker rolling  Once       Question:  Patient needs a walker to treat with the following condition  Answer:  Lumbar pseudoarthrosis   05/22/20 1812   05/22/20 1813  DME 3 n 1  Once        05/22/20 1812          Follow-up Information    [facility] Follow up.   Specialty: Home Health Services Why: For home health services. They will call you in 1-2 days to set up your first home health appointment.  Contact information: [address] [place] [address] [phone]               Signed: Kathaleen Maser  Sunshine Mackowski 05/24/2020, 9:45 AM

## 2020-05-24 NOTE — Progress Notes (Signed)
Rehab Admissions Coordinator Note:  Patient was screened by Clois Dupes for appropriateness for an Inpatient Acute Rehab Consult per Change in therapy recommendations this morning. Noted patient with plans for D/C home with [facility] but with a decline in function today. At this time, we are recommending Inpatient Rehab consult, but CIR unlikely to have a bed for this patient this week. I have alerted Morrie Sheldon, RN that rehab consult is recommended, but other rehab options should also be explored.  Clois Dupes RN MSN 05/24/2020, 10:46 AM  I can be reached at [phone].

## 2020-05-24 NOTE — Progress Notes (Signed)
Occupational Therapy Treatment Patient Details Name: Chase Gonzalez MRN: [id number] DOB: [birth year] Today's Date: 05/24/2020    History of present illness 59 yo male s/p L2-S1 with scaral two alar/illiac screws PMH arthritis depression kidney stones neuropathy ACDF 10/2019 Hardware removal 11/2019 LTKA  Posterior cervical fusion 02/2027 BIl rotator cuff   OT comments  OT treatment session with focus on self-care re-education, bed mobility utilizing log rolling technique, and short-distance functional mobility in prep for ADLs. Patient with 7-8/10 pain in bilateral knees and low back s/p premedication. Patient continues to require cues for adherence to back precautions during ADL tasks particularly during LB dressing even with use of AD. Patient would benefit from continued acute OT services to maximize safety and independence with self-care tasks in prep for safe d/c home.    Follow Up Recommendations  Home health OT    Equipment Recommendations  None recommended by OT;Other (comment)    Recommendations for Other Services      Precautions / Restrictions Precautions Precautions: Back Precaution Comments: Patient able to recall 3/3 back precautions. Continued cues required for maintaining during functioanl activity. Required Braces or Orthoses: Spinal Brace Spinal Brace: Applied in sitting position;Lumbar corset Restrictions Weight Bearing Restrictions: No       Mobility Bed Mobility Overal bed mobility: Modified Independent             General bed mobility comments: Patient able to return demo log rolling technique without cues and good accuracy.  Transfers Overall transfer level: Needs assistance Equipment used: Rolling walker (2 wheeled) Transfers: Sit to/from Stand Sit to Stand: Min guard         General transfer comment: Min guard for steadying in standing with increased time/effort 2/2 pain in bilateral knees and low back.    Balance Overall balance assessment:  Needs assistance Sitting-balance support: Feet supported;No upper extremity supported Sitting balance-Leahy Scale: Fair     Standing balance support: During functional activity;Bilateral upper extremity supported Standing balance-Leahy Scale: Poor Standing balance comment: Reliant on RW for support                           ADL either performed or assessed with clinical judgement   ADL Overall ADL's : Needs assistance/impaired                 Upper Body Dressing : Set up;Sitting   Lower Body Dressing: Moderate assistance;Sit to/from stand Lower Body Dressing Details (indicate cue type and reason): Patient able to thread BLE through LB clothing with use of reacher. Assist to hike pants over hips in standing and to maintain standing balance 2/2 pain in clonus in RLE.             Functional mobility during ADLs: Rolling walker;Minimal assistance General ADL Comments: Min A for short distance functional mobility in room with use of RW with increased time/effort.     Vision       Perception     Praxis      Cognition Arousal/Alertness: Awake/alert Behavior During Therapy: WFL for tasks assessed/performed Overall Cognitive Status: Within Functional Limits for tasks assessed                                          Exercises     Shoulder Instructions       General Comments Clean, dry dressing  at incision.    Pertinent Vitals/ Pain       Pain Assessment: 0-10 Pain Score: 8  Pain Location: Bilateral knees and low back Pain Descriptors / Indicators: Discomfort;Grimacing;Cramping;Guarding;Constant  Home Living                                          Prior Functioning/Environment              Frequency  Min 2X/week        Progress Toward Goals  OT Goals(current goals can now be found in the care plan section)  Progress towards OT goals: Progressing toward goals  Acute Rehab OT Goals Patient Stated  Goal: to go home OT Goal Formulation: With patient Time For Goal Achievement: 06/06/20 Potential to Achieve Goals: Good ADL Goals Pt Will Transfer to Toilet: with modified independence;ambulating;grab bars Additional ADL Goal #1: pt will correctly demonstrate positioning for adls in a seated position 170% of the time to decr fall risk  Plan Discharge plan remains appropriate    Co-evaluation                 AM-PAC OT "6 Clicks" Daily Activity     Outcome Measure   Help from another person eating meals?: None Help from another person taking care of personal grooming?: A Little Help from another person toileting, which includes using toliet, bedpan, or urinal?: A Little Help from another person bathing (including washing, rinsing, drying)?: A Lot Help from another person to put on and taking off regular upper body clothing?: A Lot Help from another person to put on and taking off regular lower body clothing?: A Little 6 Click Score: 17    End of Session Equipment Utilized During Treatment: Rolling walker;Back brace  OT Visit Diagnosis: Unsteadiness on feet (R26.81);Muscle weakness (generalized) (M62.81)   Activity Tolerance Patient tolerated treatment well   Patient Left in bed;with call bell/phone within reach   Nurse Communication Mobility status;Precautions        Time: 0174-9449 OT Time Calculation (min): 24 min  Charges: OT General Charges $OT Visit: 1 Visit OT Treatments $Self Care/Home Management : 23-37 mins  Virgene Tirone H. OTR/L Supplemental OT, Department of rehab services [phone]   Chrystle Murillo R H. 05/24/2020, 8:33 AM

## 2020-05-24 NOTE — Progress Notes (Signed)
Pt given D/C instructions with verbal understanding. Rx's were sent to the pharmacy by MD. Pt's incision has some drainage with no sign of infection. Pt's IV was removed prior to D/C. Home Health was arranged by [facility] per MD order. Pt D/C'd home via wheelchair per MD order. Pt is stable @ D/C and has no other needs at this time. Rema Fendt, RN

## 2020-05-24 NOTE — Progress Notes (Signed)
Physical Therapy Treatment Patient Details Name: Chase Gonzalez MRN: [id number] DOB: 02-Feb-1961 Today's Date: 05/24/2020    History of Present Illness 59 yo male s/p L2-S1 with scaral two alar/illiac screws PMH arthritis depression kidney stones neuropathy ACDF 10/2019 Hardware removal 11/2019 LTKA  Posterior cervical fusion 02/2027 BIl rotator cuff    PT Comments    Pt progressing slowly with post-op mobility. He was able to demonstrate transfers and ambulation with heavy min assist and almost full UE support on the RW. Noted pt required increased assist this session with basic transfers. We ambulated into the bathroom but pt was unable to turn and sit down on the toilet due to inability to maneuver RW in the tight space, prompting return to bed. Pt appears to be able to bear minimal weight through the RLE without experiencing RLE buckle and reported extreme pain. Pt reporting pain in the R hip this session, compared to pain the lateral knee during session yesterday. Feel this patient would benefit from continued therapies at the CIR level to maximize functional independence and safety prior to return home with family, due to decline in mobility. Pt prefers to return home with HHPT however acknowleges the decline in function he is currently experiencing, and recognizes he is at a high risk for falls. Pt was educated on precautions, brace application/wearing schedule, and general safety with the recommendation for CIR at d/c. Will continue to follow.      Follow Up Recommendations  CIR;Supervision for mobility/OOB     Equipment Recommendations  Rolling walker with 5" wheels    Recommendations for Other Services       Precautions / Restrictions Precautions Precautions: Back Precaution Booklet Issued: Yes (comment) Precaution Comments: Patient able to recall 3/3 back precautions. Continued cues required for maintaining during functioanl activity. Required Braces or Orthoses: Spinal  Brace Spinal Brace: Applied in sitting position;Lumbar corset Restrictions Weight Bearing Restrictions: No    Mobility  Bed Mobility Overal bed mobility: Needs Assistance Bed Mobility: Rolling;Sit to Sidelying Rolling: Modified independent (Device/Increase time)       Sit to sidelying: Min assist General bed mobility comments: Pt required assist for LE elevation up onto bed at end of session.  Transfers Overall transfer level: Needs assistance Equipment used: Rolling walker (2 wheeled) Transfers: Sit to/from Stand Sit to Stand: Min assist         General transfer comment: Assist required for power-up to full stand as well as for gaining/maintaining standing balance. Significant weight bearing on RW as pt reports he cannot bear weight through his RLE. Noted R knee flexed and hip flexed with heel raised in standing.  Ambulation/Gait Ambulation/Gait assistance: Min assist Gait Distance (Feet): 20 Feet Assistive device: Rolling walker (2 wheeled) Gait Pattern/deviations: Trunk flexed;Step-to pattern;Decreased weight shift to right;Decreased dorsiflexion - right Gait velocity: Decreased Gait velocity interpretation: <1.31 ft/sec, indicative of household ambulator General Gait Details: Focus was ambulation to/from bathroom. Pt bearing most of his weight through UE's onto the RW and had difficulty advancing RLE. Pt reports severe pain in the R hip and was moaning throughout mobility attempts. RLE tremor noted at times when pt attempting to force heel down, but no tremor noted with toe touch only.   Stairs             Wheelchair Mobility    Modified Rankin (Stroke Patients Only)       Balance Overall balance assessment: Needs assistance Sitting-balance support: Feet supported;No upper extremity supported Sitting balance-Leahy Scale: Fair  Standing balance support: During functional activity;Bilateral upper extremity supported Standing balance-Leahy Scale:  Poor Standing balance comment: Reliant on RW for support                            Cognition Arousal/Alertness: Awake/alert Behavior During Therapy: WFL for tasks assessed/performed Overall Cognitive Status: Within Functional Limits for tasks assessed                                        Exercises      General Comments General comments (skin integrity, edema, etc.): Clean, dry dressing at incision.      Pertinent Vitals/Pain Pain Assessment: Faces Pain Score: 8  Faces Pain Scale: Hurts whole lot Pain Location: R hip Pain Descriptors / Indicators: Discomfort;Grimacing;Cramping;Guarding;Constant;Moaning Pain Intervention(s): Limited activity within patient's tolerance;Monitored during session;Repositioned    Home Living                      Prior Function            PT Goals (current goals can now be found in the care plan section) Acute Rehab PT Goals Patient Stated Goal: to go home PT Goal Formulation: With patient Time For Goal Achievement: 05/30/20 Potential to Achieve Goals: Good Progress towards PT goals: Progressing toward goals    Frequency    Min 5X/week      PT Plan Discharge plan needs to be updated    Co-evaluation              AM-PAC PT "6 Clicks" Mobility   Outcome Measure  Help needed turning from your back to your side while in a flat bed without using bedrails?: None Help needed moving from lying on your back to sitting on the side of a flat bed without using bedrails?: A Little Help needed moving to and from a bed to a chair (including a wheelchair)?: A Little Help needed standing up from a chair using your arms (e.g., wheelchair or bedside chair)?: A Little Help needed to walk in hospital room?: A Little Help needed climbing 3-5 steps with a railing? : Total 6 Click Score: 17    End of Session Equipment Utilized During Treatment: Gait belt;Back brace Activity Tolerance: Patient limited by  pain;Patient limited by fatigue Patient left: in bed;with call bell/phone within reach Nurse Communication: Mobility status PT Visit Diagnosis: Unsteadiness on feet (R26.81);Pain;Other symptoms and signs involving the nervous system (R29.898) Pain - Right/Left: Right Pain - part of body: Hip (back)     Time: 1610-9604 PT Time Calculation (min) (ACUTE ONLY): 18 min  Charges:  $Gait Training: 8-22 mins                     Conni Slipper, PT, DPT Acute Rehabilitation Services Pager: [phone] Office: [phone]    Marylynn Pearson 05/24/2020, 10:32 AM

## 2021-04-01 ENCOUNTER — Other Ambulatory Visit: Payer: Self-pay | Admitting: Neurosurgery

## 2021-04-01 DIAGNOSIS — S32009G Unspecified fracture of unspecified lumbar vertebra, subsequent encounter for fracture with delayed healing: Secondary | ICD-10-CM

## 2021-04-29 ENCOUNTER — Other Ambulatory Visit: Payer: BC Managed Care – PPO

## 2021-04-29 ENCOUNTER — Ambulatory Visit
Admission: RE | Admit: 2021-04-29 | Discharge: 2021-04-29 | Disposition: A | Discharge status: Home / Self Care | Payer: BC Managed Care – PPO | Source: Ambulatory Visit | Attending: Neurosurgery | Admitting: Neurosurgery

## 2021-04-29 ENCOUNTER — Other Ambulatory Visit: Payer: Self-pay

## 2021-04-29 DIAGNOSIS — S32009G Unspecified fracture of unspecified lumbar vertebra, subsequent encounter for fracture with delayed healing: Secondary | ICD-10-CM

## 2021-04-29 MED ORDER — GADOBENATE DIMEGLUMINE 529 MG/ML IV SOLN
19.0000 mL | Freq: Once | INTRAVENOUS | Status: AC | PRN
  Administered 2021-04-29: 19 mL via INTRAVENOUS

## 2021-12-01 IMAGING — MR MR LUMBAR SPINE WO/W CM
4 of 9 series · 23 of 48 positions shown · IV contrast (19 cc multihance)
Comparison: Lumbar spine x-rays dated March 28, 2021. MRI lumbar
spine dated January 26, 2020.

CLINICAL DATA: Chronic low back pain.  Multiple prior surgeries.

EXAM:
MRI LUMBAR SPINE WITHOUT AND WITH CONTRAST
TECHNIQUE: Multiplanar and multiecho pulse sequences of the lumbar spine were
obtained without and with intravenous contrast.
CONTRAST:  19mL MULTIHANCE GADOBENATE DIMEGLUMINE 529 MG/ML IV SOLN

[Series 3: T1 · sagittal · 4.0mm · 1.09mm/px · 4 of 19 slices shown (1 of 2)]
[im 1/19]
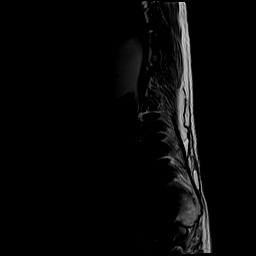
[im 7/19]
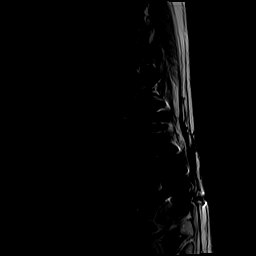
[im 13/19]
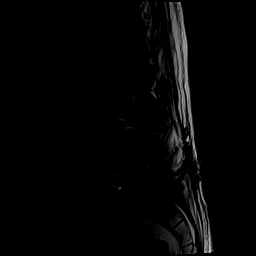
[im 19/19]
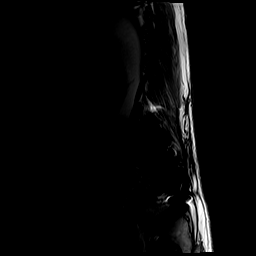

[Series 5: T2 · axial · 4.0mm · 0.39mm/px · z∈[-49,+167]mm · 8 of 40 slices shown (1 of 2)]
[im 1/40]
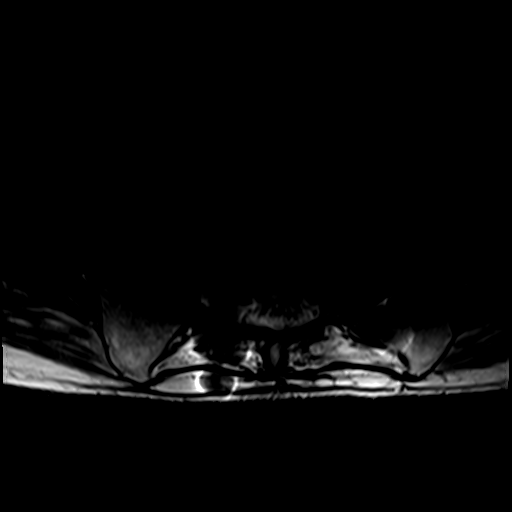
[im 6/40]
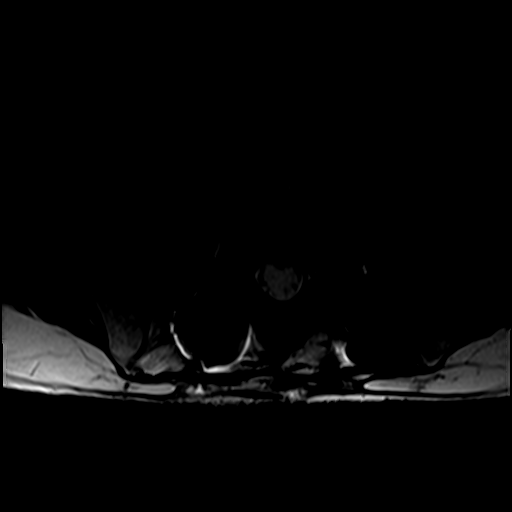
[im 12/40]
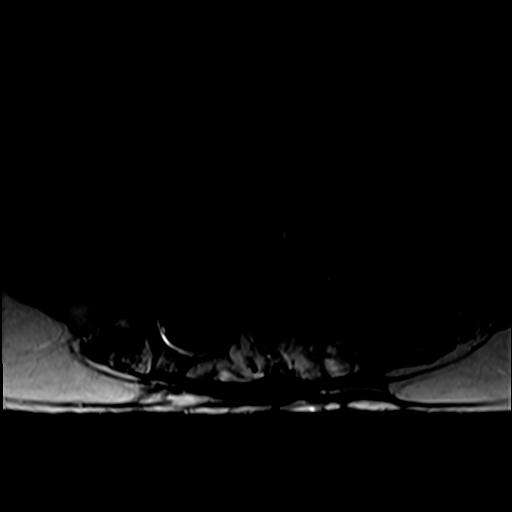
[im 17/40]
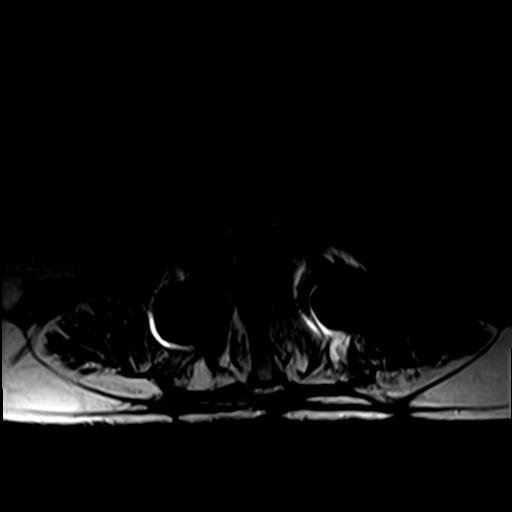
[im 23/40]
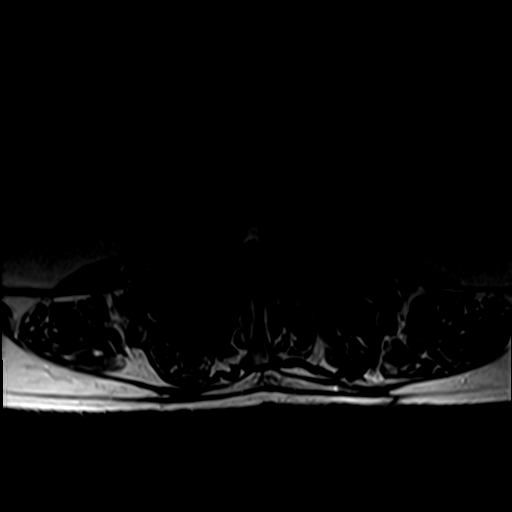
[im 28/40]
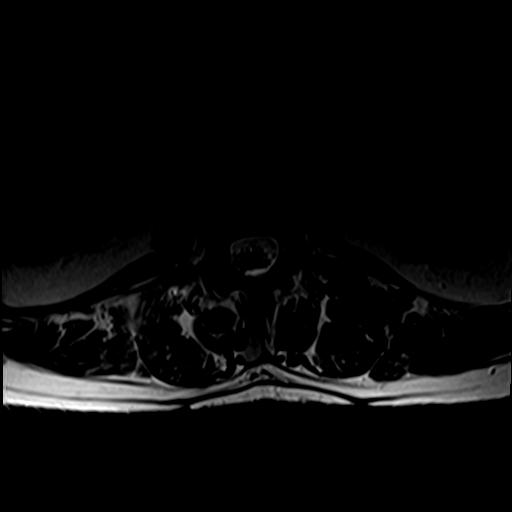
[im 34/40]
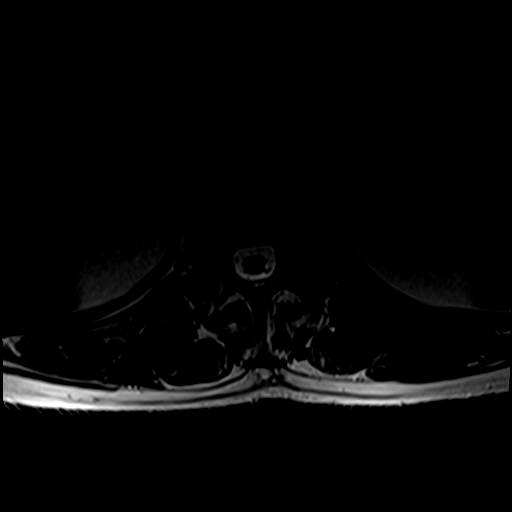
[im 40/40]
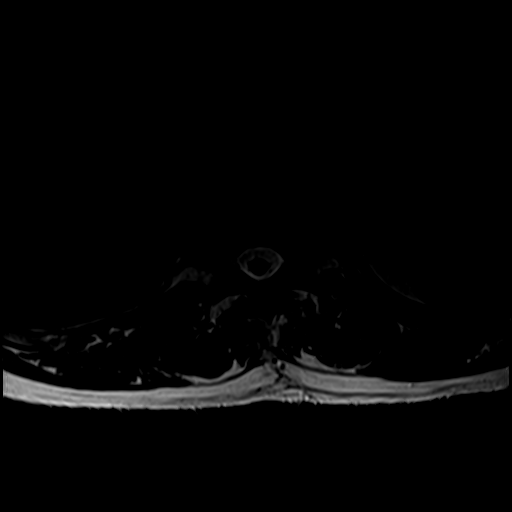

[Series 6: T1 · axial · 4.0mm · 0.39mm/px · z∈[-49,+139]mm · 7 of 40 slices shown (2 of 2)]
[im 1/40]
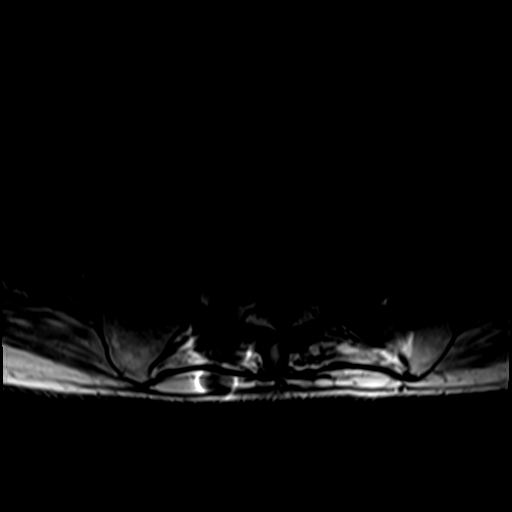
[im 6/40]
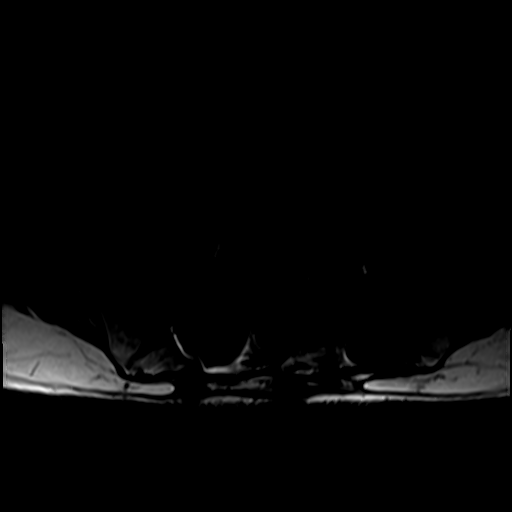
[im 12/40]
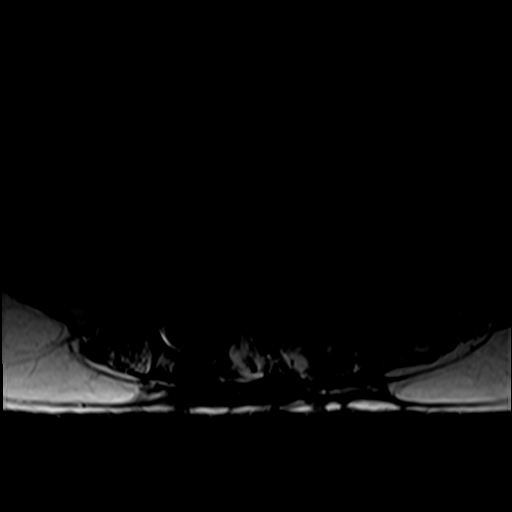
[im 17/40]
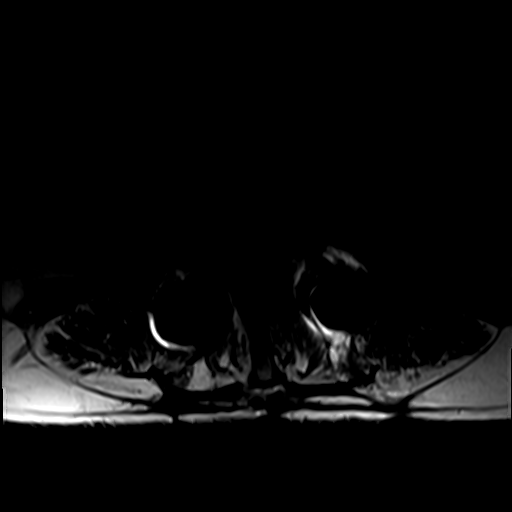
[im 23/40]
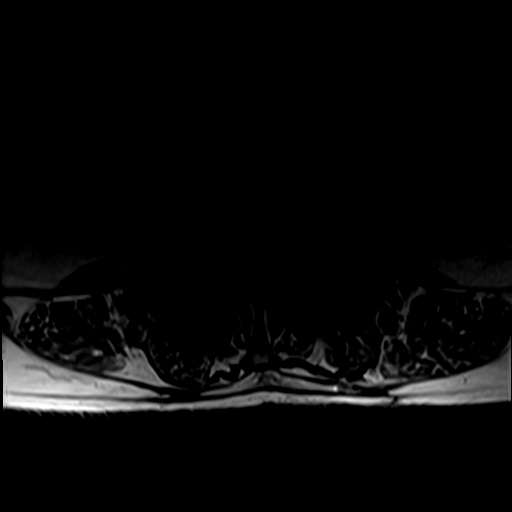
[im 28/40]
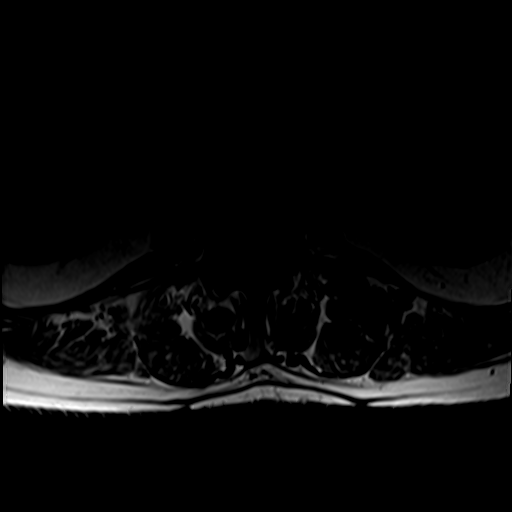
[im 34/40]
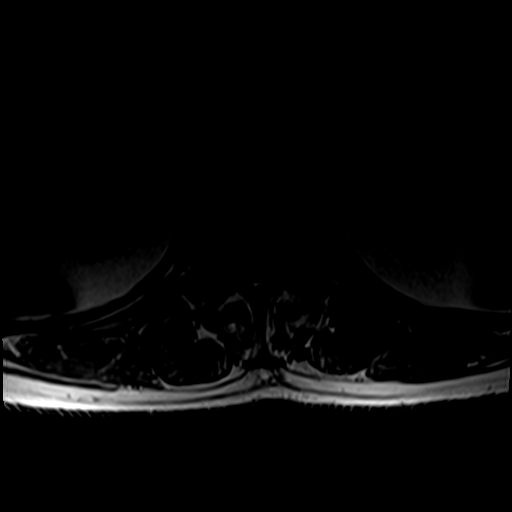

[Series 7: T2 · sagittal · 4.0mm · 1.09mm/px · 4 of 19 slices shown (2 of 2)]
[im 1/19]
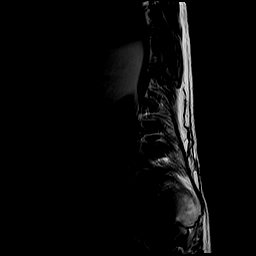
[im 7/19]
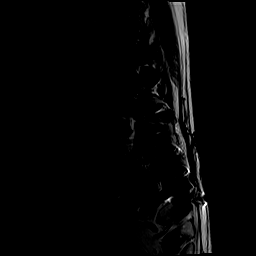
[im 13/19]
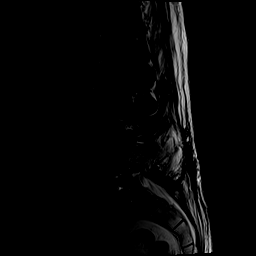
[im 19/19]
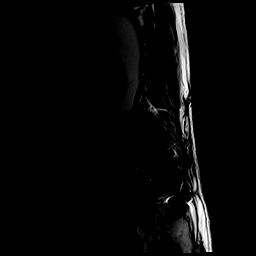

[23 of 48 positions shown; findings below may reference images not displayed]

FINDINGS: Segmentation:  Standard.

Alignment: Unchanged mild stepwise retrolisthesis from T12-L1
through L3-L4. Unchanged mild anterolisthesis at L5-S1.

Vertebrae: Prior L2-pelvis posterior fusion with interbody grafts at
L4-L5 and L5-S1. No acute fracture, evidence of discitis, or
suspicious bone lesion.

Conus medullaris and cauda equina: Conus extends to the L2 level.
Conus and cauda equina appear normal. No intradural enhancement.

Paraspinal and other soft tissues: Negative.

Disc levels:

T10-T11: Prior posterior decompression with partial interbody and
posterior element ankylosis. Unchanged mild bilateral neuroforaminal
stenosis.

T11-T12: Unchanged small circumferential disc osteophyte complex and
advanced bilateral facet arthropathy. Unchanged moderate left and
mild right neuroforaminal stenosis.

T12-L1: Progressive disc bulging and endplate spurring with moderate
bilateral facet arthropathy. New mild spinal canal stenosis and
moderate left neuroforaminal stenosis.

L1-L2: Progressive disc bulging and endplate spurring. New large
right subarticular disc protrusion with severe right lateral recess
stenosis and impingement of the descending right L2 nerve root. New
mild right neuroforaminal stenosis. No spinal canal or left
neuroforaminal stenosis.

L2-L3: Interval posterior fusion. Unchanged circumferential disc
osteophyte complex. No spinal canal or neuroforaminal stenosis.

L3-L4: Progressive severe disc height loss with partial interbody
ankylosis. Resolved spinal canal stenosis. Continued moderate to
severe bilateral neuroforaminal stenosis.

L4-L5: Prior posterior decompression and interbody fusion. No spinal
canal stenosis. Unchanged moderate bilateral neuroforaminal
stenosis.

L5-S1: Prior posterior decompression and PLIF. No spinal canal
stenosis. Unchanged moderate bilateral neuroforaminal stenosis.
IMPRESSION: 1. Prior L2-pelvis posterior fusion with progressive adjacent
segment disease at L1-L2. New large right subarticular disc
protrusion at L1-L2 impinging on the descending right L2 nerve root.
2. Progressive spondylosis at T12-L1 with new mild spinal canal
stenosis and moderate left neuroforaminal stenosis.
3. Unchanged moderate to severe bilateral neuroforaminal stenosis at
L3-L4 and moderate bilateral neuroforaminal stenosis at L4-L5 and
L5-S1.

## 2022-01-21 ENCOUNTER — Other Ambulatory Visit: Payer: Self-pay | Admitting: Neurosurgery

## 2022-01-21 DIAGNOSIS — S32009K Unspecified fracture of unspecified lumbar vertebra, subsequent encounter for fracture with nonunion: Secondary | ICD-10-CM

## 2022-02-10 ENCOUNTER — Ambulatory Visit
Admission: RE | Admit: 2022-02-10 | Discharge: 2022-02-10 | Disposition: A | Discharge status: Home / Self Care | Payer: BC Managed Care – PPO | Source: Ambulatory Visit | Attending: Neurosurgery | Admitting: Neurosurgery

## 2022-02-10 ENCOUNTER — Other Ambulatory Visit: Payer: BC Managed Care – PPO

## 2022-02-10 DIAGNOSIS — S32009K Unspecified fracture of unspecified lumbar vertebra, subsequent encounter for fracture with nonunion: Secondary | ICD-10-CM

## 2022-02-13 ENCOUNTER — Other Ambulatory Visit: Payer: Self-pay | Admitting: Neurosurgery

## 2022-02-14 ENCOUNTER — Encounter (HOSPITAL_COMMUNITY): Payer: Self-pay

## 2022-02-14 ENCOUNTER — Other Ambulatory Visit: Payer: Self-pay

## 2022-02-14 ENCOUNTER — Encounter (HOSPITAL_COMMUNITY)
Admission: RE | Admit: 2022-02-14 | Discharge: 2022-02-14 | Disposition: A | Discharge status: Home / Self Care | Payer: BC Managed Care – PPO | Source: Ambulatory Visit | Attending: Neurosurgery | Admitting: Neurosurgery

## 2022-02-14 VITALS — BP 138/93 | HR 102 | Temp 97.7°F | Resp 18 | Ht 71.0 in | Wt 208.6 lb

## 2022-02-14 DIAGNOSIS — T84038A Mechanical loosening of other internal prosthetic joint, initial encounter: Secondary | ICD-10-CM | POA: Insufficient documentation

## 2022-02-14 DIAGNOSIS — Z01818 Encounter for other preprocedural examination: Secondary | ICD-10-CM | POA: Insufficient documentation

## 2022-02-14 LAB — CBC
HCT: 46.3 % (ref 39.0–52.0)
Hemoglobin: 15.9 g/dL (ref 13.0–17.0)
MCH: 29.1 pg (ref 26.0–34.0)
MCHC: 34.3 g/dL (ref 30.0–36.0)
MCV: 84.8 fL (ref 80.0–100.0)
Platelets: 239 10*3/uL (ref 150–400)
RBC: 5.46 MIL/uL (ref 4.22–5.81)
RDW: 13.2 % (ref 11.5–15.5)
WBC: 7.1 10*3/uL (ref 4.0–10.5)
nRBC: 0 % (ref 0.0–0.2)

## 2022-02-14 LAB — SURGICAL PCR SCREEN
MRSA, PCR: NEGATIVE
Staphylococcus aureus: NEGATIVE

## 2022-02-14 NOTE — Progress Notes (Signed)
LVM for Dr. Lindalou Hose office at the request of the pt, as he sts he may have transportation issues on the day of surgery and would like to know if the date can be changed if possible. Pt sts if it can not be changed, he can try and make other arrangements. Pt sts he did leave a message for the scheduler on yesterday, 8/31 with no response, as yet.

## 2022-02-14 NOTE — Progress Notes (Signed)
PCP - Dr. Bland Span  Cardiologist - Denies  EP- Denies  Endocrine- Denies  Pulm- Denies  Chest x-ray - Denies  EKG - Denies  Stress Test - Denies  ECHO - 06/05/16 (CE)- Req'd  Cardiac Cath - Denies  AICD-na PM-na LOOP-na  Nerve Stimulator- Denies  Dialysis- Denies  Sleep Study - Denies CPAP - Denies  LABS- 02/14/22: CBC, PCR  ASA- Denies  ERAS- No  HA1C- Denies  Anesthesia- Yes- req'd records  Pt denies having chest pain, sob, or fever at this time. All instructions explained to the pt, with a verbal understanding of the material. Pt agrees to go over the instructions while at home for a better understanding. Pt also instructed to wear a mask and social distance if he goes out. The opportunity to ask questions was provided.

## 2022-02-14 NOTE — Pre-Procedure Instructions (Signed)
Chase Gonzalez  02/14/2022       Surgical Instructions   Your procedure is scheduled on Fri., Sept. 8, 2023 from 1:34PM- 2:57PM  Report to [facility] Main Entrance "A" at 11:30 A.M., then check in with the Admitting office.  Call this number if you have problems the morning of surgery:  [phone]   Remember:  Do not eat or drink after midnight the night on Sept. 7th    Take these medicines the morning of surgery with A SIP OF WATER: DULoxetine (CYMBALTA) FLUoxetine (PROZAC)   If Needed: Acetaminophen (TYLENOL) OxyCODONE  As of today, STOP taking any Aspirin (unless otherwise instructed by your surgeon) NSAIDS i.e: Aleve, Naproxen, Ibuprofen, Meloxicam (MOBIC), Motrin, Advil, Goody's, BC's, all herbal medications, fish oil, and all vitamins.             Day of Surgery: Do not wear jewelry.  Do not wear lotions, powders, colognes, or deodorant. Do not shave 48 hours prior to surgery.  Men may shave face and neck. Do not bring valuables to the hospital             [facility] is not responsible for any belongings or valuables.  Do NOT Smoke (Tobacco/Vaping)  24 hours prior to your procedure  If you use a CPAP at night, you may bring your mask and machine for your overnight stay.   Contacts, glasses, hearing aids, dentures or partials may not be worn into surgery, please bring cases for these belongings   For patients admitted to the hospital, discharge time will be determined by your treatment team.   Patients discharged the day of surgery will not be allowed to drive home, and someone needs to stay with them for 24 hours.  Special instructions:    Oral Hygiene is also important to reduce your risk of infection.  Remember - BRUSH YOUR TEETH THE MORNING OF SURGERY WITH YOUR REGULAR TOOTHPASTE  [facility]- Preparing For Surgery  Before surgery, you can play an important role. Because skin is not sterile, your skin needs to be as free of germs as possible. You can  reduce the number of germs on your skin by washing with CHG (chlorahexidine gluconate) Soap before surgery.  CHG is an antiseptic cleaner which kills germs and bonds with the skin to continue killing germs even after washing.    Please do not use if you have an allergy to CHG or antibacterial soaps. If your skin becomes reddened/irritated stop using the CHG.  Do not shave (including legs and underarms) for at least 48 hours prior to first CHG shower. It is OK to shave your face.  Please follow these instructions carefully.    Shower the NIGHT BEFORE SURGERY and the MORNING OF SURGERY with CHG Soap.   If you chose to wash your hair, wash your hair first as usual with your normal shampoo. After you shampoo, rinse your hair and body thoroughly to remove the shampoo.  Then [employer] and genitals (private parts) with your normal soap and rinse thoroughly to remove soap.  After that Use CHG Soap as you would any other liquid soap. You can apply CHG directly to the skin and wash gently with a scrungie or a clean washcloth.   Apply the CHG Soap to your body ONLY FROM THE NECK DOWN.  Do not use on open wounds or open sores. Avoid contact with your eyes, ears, mouth and genitals (private parts). Wash Face and genitals (private parts)  with your normal soap.   Wash thoroughly, paying special attention to the area where your surgery will be performed.  Thoroughly rinse your body with warm water from the neck down.  DO NOT shower/wash with your normal soap after using and rinsing off the CHG Soap.  Pat yourself dry with a CLEAN TOWEL.  Wear CLEAN PAJAMAS to bed the night before surgery  Place CLEAN SHEETS on your bed the night before your surgery  DO NOT SLEEP WITH PETS.  Reminder: Take a shower with CHG soap. Wear Clean/Comfortable clothing the morning of surgery Do not apply any deodorants/lotions.   Remember to brush your teeth WITH YOUR REGULAR TOOTHPASTE.  SURGICAL WAITING ROOM  VISITATION Patients having surgery or a procedure may have no more than 2 support people in the waiting area - these visitors may rotate.   Children under the age of 28 must have an adult with them who is not the patient. If the patient needs to stay at the hospital during part of their recovery, the visitor guidelines for inpatient rooms apply. Pre-op nurse will coordinate an appropriate time for 1 support person to accompany patient in pre-op.  This support person may not rotate.   Please refer to the [facility] website for the visitor guidelines for Inpatients (after your surgery is over and you are in a regular room).   If you have been in contact with anyone that has tested positive in the last 10 days please notify you surgeon.  Please read over the following fact sheets that you were given.

## 2022-02-18 NOTE — Anesthesia Preprocedure Evaluation (Addendum)
Anesthesia Evaluation  Patient identified by MRN, date of birth, ID band Patient awake    Reviewed: Allergy & Precautions, NPO status , Patient's Chart, lab work & pertinent test results  Airway Mallampati: I  TM Distance: >3 FB Neck ROM: Full    Dental  (+) Teeth Intact, Dental Advisory Given   Pulmonary neg pulmonary ROS,    Pulmonary exam normal breath sounds clear to auscultation       Cardiovascular negative cardio ROS Normal cardiovascular exam Rhythm:Regular Rate:Normal     Neuro/Psych PSYCHIATRIC DISORDERS Anxiety Depression hardware loosening bilateral L2 pedicle screws  neck surgery (bilateral cervical laminectomy C3-4 02/26/17, ACDF 10/15/17), back surgery (bilateral thoracic laminectomy T10-11 with decompression and excision of ruptured disc 02/26/16; L4-5 PLIF/posterior lateral arthrodesis 12/28/18; removal L4-S1 pedicle screws 12/09/19; L2-S11 posterior lateral arthrodesis, revision, segmental pedicle screw instrumentation L2-S1 with iliac fixation 05/22/20)    GI/Hepatic negative GI ROS, Neg liver ROS,   Endo/Other  negative endocrine ROS  Renal/GU negative Renal ROS     Musculoskeletal  (+) Arthritis ,   Abdominal   Peds  Hematology negative hematology ROS (+)   Anesthesia Other Findings   Reproductive/Obstetrics                           Anesthesia Physical Anesthesia Plan  ASA: 2  Anesthesia Plan: General   Post-op Pain Management: Tylenol PO (pre-op)* and Ketamine IV*   Induction: Intravenous  PONV Risk Score and Plan: 2 and Midazolam, Dexamethasone and Ondansetron  Airway Management Planned: Oral ETT and Video Laryngoscope Planned  Additional Equipment:   Intra-op Plan:   Post-operative Plan: Extubation in OR  Informed Consent: I have reviewed the patients History and Physical, chart, labs and discussed the procedure including the risks, benefits and alternatives  for the proposed anesthesia with the patient or authorized representative who has indicated his/her understanding and acceptance.     Dental advisory given  Plan Discussed with: CRNA  Anesthesia Plan Comments: (PAT note written 02/18/2022 by Shonna Chock, PA-C. )     Anesthesia Quick Evaluation

## 2022-02-18 NOTE — Progress Notes (Signed)
Anesthesia Chart Review:  Case: [id number] Date/Time: 03/03/22 0745   Procedure: Removal of bilateral L2 pedicle screws (Bilateral: Back)   Anesthesia type: General   Pre-op diagnosis: hardware loosening   [facility] ROOM 18 / [facility]   Surgeons: Julio Sicks, MD       DISCUSSION: Patient is a 61 year old male scheduled for the above procedure. Surgery was initially scheduled for 02/21/22, but he requested date change due to transportation issues and is now scheduled for 03/03/22.   History includes never smoker, neuropathy, osteoarthritis (right TKA 06/2016), neck surgery (bilateral cervical laminectomy C3-4 02/26/17, ACDF 10/15/17), back surgery (bilateral thoracic laminectomy T10-11 with decompression and excision of ruptured disc 02/26/16; L4-5 PLIF/posterior lateral arthrodesis 12/28/18; removal L4-S1 pedicle screws 12/09/19; L2-S11 posterior lateral arthrodesis, revision, segmental pedicle screw instrumentation L2-S1 with iliac fixation 05/22/20).   He denied chest pain, shortness of breath, fever, cough per PAT RN interview. Anesthesia team to evaluate on the day of surgery.    VS: BP (!) 138/93   Pulse (!) 102   Temp 36.5 C (Oral)   Resp 18   Ht 5\' 11"  (1.803 m)   Wt 94.6 kg   SpO2 99%   BMI 29.09 kg/m    PROVIDERS: , MD is PCP - He is not routinely followed by cardiologist, but he was evaluated by Judge Stall, MD with [facility] in 05/2016 for preoperative evaluation prior to knee surgery given some dyspnea with moderate activity, but no chest pain. EKG was normal.  Echo showed normal LVEF with no significant valvular abnormalities. He was felt low risk for his knee surgery.    LABS: Labs reviewed: Acceptable for surgery. (all labs ordered are listed, but only abnormal results are displayed)  Labs Reviewed  SURGICAL PCR SCREEN  CBC     IMAGES: CT L-spine 02/10/22:  IMPRESSION: 1. 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 2. Advanced degeneration at the open levels with  progression since 2022. 3. Foraminal impingement bilaterally at T10-11, T11-12 and on the left at T12-L1. 4. Chronic foraminal narrowing at fused levels described above.  MRI L-spine 04/29/21: IMPRESSION: 1. Prior L2-pelvis posterior fusion with progressive adjacent segment disease at L1-L2. New large right subarticular disc protrusion at L1-L2 impinging on the descending right L2 nerve root. 2. Progressive spondylosis at T12-L1 with new mild spinal canal stenosis and moderate left neuroforaminal stenosis. 3. Unchanged moderate to severe bilateral neuroforaminal stenosis at L3-L4 and moderate bilateral neuroforaminal stenosis at L4-L5 and L5-S1.    EKG: N/A (05/29/16 from [facility] showed NSR; scanned under Media tab, Correspondence, 12/24/18)   CV: Echo 06/05/16 [facility], scanned under Media tab, Correspondence, 12/24/18): Summary: - Left Ventricle: There is normal LV wall thickness.  The LV is normal in size.Left ventricular systolic function is normal.  Ejection fraction is > 55%.  The transmitral spectral Doppler flow pattern is suggestive of impaired LV relaxation.  No regional wall motion abnormalities noted. - Right Ventricle: The right ventricle is normal in size and function.  - Atria: Interatrial septum is intact with no evidence of atrial septal defect. - Mitral Valve: There is trace mitral regurgitation.   - Tricuspid Valve: There is trace tricuspid regurgitation.  Right ventricular systolic pressure is normal.   - Pulmonic Valve: There is trace pulmonic regurgitation.   - Great Vessels: The aortic root is normal size.   Past Medical History:  Diagnosis Date   Anxiety    Arthritis    Constipation due to opioid therapy    Depression  due to pain   History of kidney stones    history of previous kidney stones, several    Neuropathy     Past Surgical History:  Procedure Laterality Date   ANTERIOR CERVICAL DECOMP/DISCECTOMY FUSION  10/15/2017    Dr. Jordan Likes   APPLICATION OF ROBOTIC ASSISTANCE FOR SPINAL PROCEDURE N/A 05/22/2020   Procedure: APPLICATION OF ROBOTIC ASSISTANCE FOR SPINAL PROCEDURE;  Surgeon: Julio Sicks, MD;  Location: [facility] OR;  Service: Neurosurgery;  Laterality: N/A;   HARDWARE REMOVAL N/A 12/09/2019   Procedure: Removal of Lumbar Four, Lumbar Five and Sacral One pedicle screws;  Surgeon: Julio Sicks, MD;  Location: [facility] OR;  Service: Neurosurgery;  Laterality: N/A;  Removal of Lumbar Four, Lumbar Five and Sacral One pedicle screws   JOINT REPLACEMENT Left    Knee   LAMINECTOMY WITH POSTERIOR LATERAL ARTHRODESIS LEVEL 4 N/A 05/15/2020   Procedure: CANCELLED PROCEDURE;  Surgeon: Julio Sicks, MD;  Location: [facility] OR;  Service: Neurosurgery;  Laterality: N/A;   LAMINECTOMY WITH POSTERIOR LATERAL ARTHRODESIS LEVEL 4 N/A 05/22/2020   Procedure: Posterior lateral fusion - Lumbar two-Sacral one with Sacral two AIar/iIliac screws - Mazor;  Surgeon: Julio Sicks, MD;  Location: [facility];  Service: Neurosurgery;  Laterality: N/A;   posterior cervical disc fusion  02/26/2017   ROTATOR CUFF REPAIR Bilateral    THORACIC DISC SURGERY     2017, 2 ruptured discs, had emergency surgery    MEDICATIONS:  acetaminophen (TYLENOL) 500 MG tablet   Ascorbic Acid (VITAMIN C PO)   docusate sodium (COLACE) 100 MG capsule   DULoxetine (CYMBALTA) 60 MG capsule   FLUoxetine (PROZAC) 10 MG capsule   gabapentin (NEURONTIN) 300 MG capsule   Glucosamine HCl (GLUCOSAMINE PO)   ibuprofen (ADVIL) 200 MG tablet   meloxicam (MOBIC) 15 MG tablet   Multiple Vitamins-Minerals (MULTIVITAMIN WITH MINERALS) tablet   omega-3 acid ethyl esters (LOVAZA) 1 g capsule   oxyCODONE 10 MG TABS   polyethylene glycol (MIRALAX / GLYCOLAX) 17 g packet   No current facility-administered medications for this encounter.    Shonna Chock, PA-C Surgical Short Stay/Anesthesiology [facility] Phone [phone] [facility] Phone [phone] 02/18/2022 4:50 PM

## 2022-02-28 NOTE — Progress Notes (Signed)
LVM with surgery time [id number], arrival 0600. Pt advised to follow the previous instructions given at the pre-op appointment.

## 2022-03-03 ENCOUNTER — Encounter (HOSPITAL_COMMUNITY)
Admission: RE | Disposition: A | Discharge status: Home / Self Care | Payer: Self-pay | Source: Home / Self Care | Attending: Neurosurgery

## 2022-03-03 ENCOUNTER — Ambulatory Visit (HOSPITAL_COMMUNITY): Payer: BC Managed Care – PPO | Admitting: Anesthesiology

## 2022-03-03 ENCOUNTER — Other Ambulatory Visit: Payer: Self-pay

## 2022-03-03 ENCOUNTER — Observation Stay (HOSPITAL_COMMUNITY)
Admission: RE | Admit: 2022-03-03 | Discharge: 2022-03-03 | Disposition: A | Discharge status: Home / Self Care | Payer: BC Managed Care – PPO | Attending: Neurosurgery | Admitting: Neurosurgery

## 2022-03-03 ENCOUNTER — Ambulatory Visit (HOSPITAL_COMMUNITY): Payer: BC Managed Care – PPO

## 2022-03-03 ENCOUNTER — Ambulatory Visit (HOSPITAL_COMMUNITY): Payer: BC Managed Care – PPO | Admitting: Vascular Surgery

## 2022-03-03 ENCOUNTER — Encounter (HOSPITAL_COMMUNITY): Payer: Self-pay | Admitting: Neurosurgery

## 2022-03-03 DIAGNOSIS — Z79899 Other long term (current) drug therapy: Secondary | ICD-10-CM | POA: Insufficient documentation

## 2022-03-03 DIAGNOSIS — Z96652 Presence of left artificial knee joint: Secondary | ICD-10-CM | POA: Diagnosis not present

## 2022-03-03 DIAGNOSIS — F17220 Nicotine dependence, chewing tobacco, uncomplicated: Secondary | ICD-10-CM | POA: Diagnosis not present

## 2022-03-03 DIAGNOSIS — Y831 Surgical operation with implant of artificial internal device as the cause of abnormal reaction of the patient, or of later complication, without mention of misadventure at the time of the procedure: Secondary | ICD-10-CM | POA: Diagnosis not present

## 2022-03-03 DIAGNOSIS — T8484XA Pain due to internal orthopedic prosthetic devices, implants and grafts, initial encounter: Secondary | ICD-10-CM | POA: Diagnosis not present

## 2022-03-03 HISTORY — PX: HARDWARE REMOVAL: SHX979

## 2022-03-03 SURGERY — REMOVAL, HARDWARE
Anesthesia: General | Site: Back | Laterality: Bilateral

## 2022-03-03 MED ORDER — CHLORHEXIDINE GLUCONATE CLOTH 2 % EX PADS: 6.0000 | MEDICATED_PAD | Freq: Once | CUTANEOUS | Status: DC

## 2022-03-03 MED ORDER — BUPIVACAINE HCL (PF) 0.25 % IJ SOLN
INTRAMUSCULAR | Status: AC
  Filled 2022-03-03: qty 30

## 2022-03-03 MED ORDER — MIDAZOLAM HCL 2 MG/2ML IJ SOLN
INTRAMUSCULAR | Status: AC
  Filled 2022-03-03: qty 2

## 2022-03-03 MED ORDER — CEFAZOLIN SODIUM-DEXTROSE 2-4 GM/100ML-% IV SOLN: 2.0000 g | INTRAVENOUS | Status: DC

## 2022-03-03 MED ORDER — MENTHOL 3 MG MT LOZG: 1.0000 | LOZENGE | OROMUCOSAL | Status: DC | PRN

## 2022-03-03 MED ORDER — IBUPROFEN 600 MG PO TABS: 600.0000 mg | ORAL_TABLET | Freq: Four times a day (QID) | ORAL | Status: DC | PRN

## 2022-03-03 MED ORDER — GLUCOSAMINE 500 MG PO CAPS: ORAL_CAPSULE | Freq: Every day | ORAL | Status: DC

## 2022-03-03 MED ORDER — MELOXICAM 7.5 MG PO TABS: 15.0000 mg | ORAL_TABLET | Freq: Every day | ORAL | Status: DC | PRN

## 2022-03-03 MED ORDER — 0.9 % SODIUM CHLORIDE (POUR BTL) OPTIME
TOPICAL | Status: DC | PRN
  Administered 2022-03-03: 1000 mL

## 2022-03-03 MED ORDER — SUGAMMADEX SODIUM 200 MG/2ML IV SOLN
INTRAVENOUS | Status: DC | PRN
  Administered 2022-03-03: 200 mg via INTRAVENOUS

## 2022-03-03 MED ORDER — ADULT MULTIVITAMIN W/MINERALS CH: 1.0000 | ORAL_TABLET | Freq: Every day | ORAL | Status: DC

## 2022-03-03 MED ORDER — ROCURONIUM BROMIDE 10 MG/ML (PF) SYRINGE
PREFILLED_SYRINGE | INTRAVENOUS | Status: AC
  Filled 2022-03-03: qty 10

## 2022-03-03 MED ORDER — ACETAMINOPHEN 325 MG PO TABS: 650.0000 mg | ORAL_TABLET | ORAL | Status: DC | PRN

## 2022-03-03 MED ORDER — GABAPENTIN 300 MG PO CAPS: 300.0000 mg | ORAL_CAPSULE | Freq: Every day | ORAL | Status: DC

## 2022-03-03 MED ORDER — ACETAMINOPHEN 500 MG PO TABS
ORAL_TABLET | ORAL | Status: AC
  Filled 2022-03-03: qty 2

## 2022-03-03 MED ORDER — MIDAZOLAM HCL 2 MG/2ML IJ SOLN
INTRAMUSCULAR | Status: DC | PRN
  Administered 2022-03-03: 2 mg via INTRAVENOUS

## 2022-03-03 MED ORDER — HYDROCODONE-ACETAMINOPHEN 5-325 MG PO TABS: 1.0000 | ORAL_TABLET | ORAL | Status: DC | PRN

## 2022-03-03 MED ORDER — ORAL CARE MOUTH RINSE: 15.0000 mL | Freq: Once | OROMUCOSAL | Status: AC

## 2022-03-03 MED ORDER — KETAMINE HCL 50 MG/5ML IJ SOSY
PREFILLED_SYRINGE | INTRAMUSCULAR | Status: AC
  Filled 2022-03-03: qty 5

## 2022-03-03 MED ORDER — FLUOXETINE HCL 10 MG PO CAPS
10.0000 mg | ORAL_CAPSULE | Freq: Every day | ORAL | Status: DC
  Filled 2022-03-03: qty 1

## 2022-03-03 MED ORDER — OMEGA-3-ACID ETHYL ESTERS 1 G PO CAPS: 2.0000 g | ORAL_CAPSULE | Freq: Two times a day (BID) | ORAL | Status: DC

## 2022-03-03 MED ORDER — CEFAZOLIN SODIUM-DEXTROSE 2-3 GM-%(50ML) IV SOLR
INTRAVENOUS | Status: DC | PRN
  Administered 2022-03-03: 2 g via INTRAVENOUS

## 2022-03-03 MED ORDER — DOCUSATE SODIUM 100 MG PO CAPS
200.0000 mg | ORAL_CAPSULE | Freq: Every day | ORAL | Status: DC
  Administered 2022-03-03: 200 mg via ORAL
  Filled 2022-03-03 (×2): qty 2

## 2022-03-03 MED ORDER — OXYCODONE HCL 5 MG PO TABS
10.0000 mg | ORAL_TABLET | ORAL | Status: DC | PRN
  Administered 2022-03-03 (×2): 10 mg via ORAL
  Filled 2022-03-03 (×2): qty 2

## 2022-03-03 MED ORDER — LIDOCAINE 2% (20 MG/ML) 5 ML SYRINGE
INTRAMUSCULAR | Status: AC
  Filled 2022-03-03: qty 5

## 2022-03-03 MED ORDER — ONDANSETRON HCL 4 MG/2ML IJ SOLN
INTRAMUSCULAR | Status: DC | PRN
  Administered 2022-03-03: 4 mg via INTRAVENOUS

## 2022-03-03 MED ORDER — PHENYLEPHRINE 80 MCG/ML (10ML) SYRINGE FOR IV PUSH (FOR BLOOD PRESSURE SUPPORT)
PREFILLED_SYRINGE | INTRAVENOUS | Status: AC
  Filled 2022-03-03: qty 10

## 2022-03-03 MED ORDER — PHENYLEPHRINE HCL-NACL 20-0.9 MG/250ML-% IV SOLN
INTRAVENOUS | Status: DC | PRN
  Administered 2022-03-03: 50 ug/min via INTRAVENOUS

## 2022-03-03 MED ORDER — CEFAZOLIN SODIUM-DEXTROSE 2-4 GM/100ML-% IV SOLN
INTRAVENOUS | Status: AC
  Filled 2022-03-03: qty 100

## 2022-03-03 MED ORDER — HYDROMORPHONE HCL 1 MG/ML IJ SOLN: 1.0000 mg | INTRAMUSCULAR | Status: DC | PRN

## 2022-03-03 MED ORDER — PROPOFOL 10 MG/ML IV BOLUS
INTRAVENOUS | Status: AC
  Filled 2022-03-03: qty 20

## 2022-03-03 MED ORDER — PHENYLEPHRINE 80 MCG/ML (10ML) SYRINGE FOR IV PUSH (FOR BLOOD PRESSURE SUPPORT)
PREFILLED_SYRINGE | INTRAVENOUS | Status: DC | PRN
  Administered 2022-03-03: 80 ug via INTRAVENOUS
  Administered 2022-03-03 (×3): 160 ug via INTRAVENOUS

## 2022-03-03 MED ORDER — LIDOCAINE 2% (20 MG/ML) 5 ML SYRINGE
INTRAMUSCULAR | Status: DC | PRN
  Administered 2022-03-03: 60 mg via INTRAVENOUS

## 2022-03-03 MED ORDER — DEXAMETHASONE SODIUM PHOSPHATE 10 MG/ML IJ SOLN
INTRAMUSCULAR | Status: AC
  Filled 2022-03-03: qty 1

## 2022-03-03 MED ORDER — LACTATED RINGERS IV SOLN: INTRAVENOUS | Status: DC

## 2022-03-03 MED ORDER — BUPIVACAINE HCL (PF) 0.25 % IJ SOLN
INTRAMUSCULAR | Status: DC | PRN
  Administered 2022-03-03: 20 mL

## 2022-03-03 MED ORDER — FENTANYL CITRATE (PF) 100 MCG/2ML IJ SOLN: 25.0000 ug | INTRAMUSCULAR | Status: DC | PRN

## 2022-03-03 MED ORDER — OXYCODONE HCL 5 MG PO TABS: 10.0000 mg | ORAL_TABLET | Freq: Three times a day (TID) | ORAL | Status: DC | PRN

## 2022-03-03 MED ORDER — GLYCOPYRROLATE PF 0.2 MG/ML IJ SOSY
PREFILLED_SYRINGE | INTRAMUSCULAR | Status: AC
  Filled 2022-03-03: qty 1

## 2022-03-03 MED ORDER — ONDANSETRON HCL 4 MG PO TABS: 4.0000 mg | ORAL_TABLET | Freq: Four times a day (QID) | ORAL | Status: DC | PRN

## 2022-03-03 MED ORDER — SODIUM CHLORIDE 0.9 % IV SOLN
250.0000 mL | INTRAVENOUS | Status: DC
  Administered 2022-03-03: 250 mL via INTRAVENOUS

## 2022-03-03 MED ORDER — PROMETHAZINE HCL 25 MG/ML IJ SOLN: 6.2500 mg | INTRAMUSCULAR | Status: DC | PRN

## 2022-03-03 MED ORDER — PHENOL 1.4 % MT LIQD: 1.0000 | OROMUCOSAL | Status: DC | PRN

## 2022-03-03 MED ORDER — VITAMIN C 500 MG PO TABS: 1000.0000 mg | ORAL_TABLET | Freq: Every day | ORAL | Status: DC

## 2022-03-03 MED ORDER — KETOROLAC TROMETHAMINE 15 MG/ML IJ SOLN
30.0000 mg | Freq: Four times a day (QID) | INTRAMUSCULAR | Status: DC
  Administered 2022-03-03 (×2): 30 mg via INTRAVENOUS
  Filled 2022-03-03 (×2): qty 2

## 2022-03-03 MED ORDER — THROMBIN 20000 UNITS EX SOLR
CUTANEOUS | Status: DC | PRN
  Administered 2022-03-03: 20 mL via TOPICAL

## 2022-03-03 MED ORDER — SODIUM CHLORIDE 0.9% FLUSH: 3.0000 mL | INTRAVENOUS | Status: DC | PRN

## 2022-03-03 MED ORDER — ONDANSETRON HCL 4 MG/2ML IJ SOLN
INTRAMUSCULAR | Status: AC
  Filled 2022-03-03: qty 2

## 2022-03-03 MED ORDER — DEXAMETHASONE SODIUM PHOSPHATE 10 MG/ML IJ SOLN
INTRAMUSCULAR | Status: DC | PRN
  Administered 2022-03-03: 10 mg via INTRAVENOUS

## 2022-03-03 MED ORDER — ROCURONIUM BROMIDE 10 MG/ML (PF) SYRINGE
PREFILLED_SYRINGE | INTRAVENOUS | Status: DC | PRN
  Administered 2022-03-03: 50 mg via INTRAVENOUS
  Administered 2022-03-03: 10 mg via INTRAVENOUS

## 2022-03-03 MED ORDER — CYCLOBENZAPRINE HCL 10 MG PO TABS
10.0000 mg | ORAL_TABLET | Freq: Three times a day (TID) | ORAL | Status: DC | PRN
  Administered 2022-03-03: 10 mg via ORAL
  Filled 2022-03-03: qty 1

## 2022-03-03 MED ORDER — ONDANSETRON HCL 4 MG/2ML IJ SOLN: 4.0000 mg | Freq: Four times a day (QID) | INTRAMUSCULAR | Status: DC | PRN

## 2022-03-03 MED ORDER — CHLORHEXIDINE GLUCONATE 0.12 % MT SOLN
OROMUCOSAL | Status: AC
  Filled 2022-03-03: qty 15

## 2022-03-03 MED ORDER — KETOROLAC TROMETHAMINE 30 MG/ML IJ SOLN
INTRAMUSCULAR | Status: DC | PRN
  Administered 2022-03-03: 30 mg via INTRAVENOUS

## 2022-03-03 MED ORDER — GLYCOPYRROLATE PF 0.2 MG/ML IJ SOSY
PREFILLED_SYRINGE | INTRAMUSCULAR | Status: DC | PRN
  Administered 2022-03-03: .2 mg via INTRAVENOUS

## 2022-03-03 MED ORDER — FENTANYL CITRATE (PF) 250 MCG/5ML IJ SOLN
INTRAMUSCULAR | Status: AC
  Filled 2022-03-03: qty 5

## 2022-03-03 MED ORDER — THROMBIN 5000 UNITS EX SOLR
CUTANEOUS | Status: AC
  Filled 2022-03-03: qty 5000

## 2022-03-03 MED ORDER — POLYETHYLENE GLYCOL 3350 17 G PO PACK: 17.0000 g | PACK | Freq: Every day | ORAL | Status: DC

## 2022-03-03 MED ORDER — SODIUM CHLORIDE 0.9% FLUSH
3.0000 mL | Freq: Two times a day (BID) | INTRAVENOUS | Status: DC
  Administered 2022-03-03: 3 mL via INTRAVENOUS

## 2022-03-03 MED ORDER — OXYCODONE HCL 10 MG PO TABS
10.0000 mg | ORAL_TABLET | ORAL | 0 refills | Status: AC
Start: 2022-03-03 — End: ?

## 2022-03-03 MED ORDER — THROMBIN 20000 UNITS EX SOLR
CUTANEOUS | Status: AC
Start: 2022-03-03 — End: ?
  Filled 2022-03-03: qty 20000

## 2022-03-03 MED ORDER — FENTANYL CITRATE (PF) 250 MCG/5ML IJ SOLN
INTRAMUSCULAR | Status: DC | PRN
  Administered 2022-03-03: 100 ug via INTRAVENOUS
  Administered 2022-03-03 (×2): 50 ug via INTRAVENOUS

## 2022-03-03 MED ORDER — ACETAMINOPHEN 650 MG RE SUPP: 650.0000 mg | RECTAL | Status: DC | PRN

## 2022-03-03 MED ORDER — KETAMINE HCL 10 MG/ML IJ SOLN
INTRAMUSCULAR | Status: DC | PRN
  Administered 2022-03-03: 10 mg via INTRAVENOUS
  Administered 2022-03-03: 30 mg via INTRAVENOUS

## 2022-03-03 MED ORDER — ACETAMINOPHEN 500 MG PO TABS
1000.0000 mg | ORAL_TABLET | Freq: Once | ORAL | Status: AC
  Administered 2022-03-03: 1000 mg via ORAL

## 2022-03-03 MED ORDER — PROPOFOL 10 MG/ML IV BOLUS
INTRAVENOUS | Status: DC | PRN
  Administered 2022-03-03: 150 mg via INTRAVENOUS
  Administered 2022-03-03: 20 mg via INTRAVENOUS

## 2022-03-03 MED ORDER — CHLORHEXIDINE GLUCONATE 0.12 % MT SOLN
15.0000 mL | Freq: Once | OROMUCOSAL | Status: AC
  Administered 2022-03-03: 15 mL via OROMUCOSAL

## 2022-03-03 MED ORDER — ACETAMINOPHEN 10 MG/ML IV SOLN
INTRAVENOUS | Status: AC
  Filled 2022-03-03: qty 100

## 2022-03-03 MED ORDER — DULOXETINE HCL 60 MG PO CPEP
60.0000 mg | ORAL_CAPSULE | Freq: Every day | ORAL | Status: DC
  Filled 2022-03-03: qty 1

## 2022-03-03 MED ORDER — CEFAZOLIN SODIUM-DEXTROSE 1-4 GM/50ML-% IV SOLN
1.0000 g | Freq: Three times a day (TID) | INTRAVENOUS | Status: DC
  Administered 2022-03-03: 1 g via INTRAVENOUS
  Filled 2022-03-03: qty 50

## 2022-03-03 SURGICAL SUPPLY — 45 items
BAG COUNTER SPONGE SURGICOUNT (BAG) ×1 IMPLANT
BAND RUBBER #18 3X1/16 STRL (MISCELLANEOUS) ×2 IMPLANT
BENZOIN TINCTURE PRP APPL 2/3 (GAUZE/BANDAGES/DRESSINGS) ×1 IMPLANT
BLADE CLIPPER SURG (BLADE) IMPLANT
CANISTER SUCT 3000ML PPV (MISCELLANEOUS) ×1 IMPLANT
CARTRIDGE OIL MAESTRO DRILL (MISCELLANEOUS) ×1 IMPLANT
CLOSURE STERI STRIP 1/2 X4 (GAUZE/BANDAGES/DRESSINGS) IMPLANT
DERMABOND ADVANCED .7 DNX12 (GAUZE/BANDAGES/DRESSINGS) ×1 IMPLANT
DIFFUSER DRILL AIR PNEUMATIC (MISCELLANEOUS) ×1 IMPLANT
DRAPE HALF SHEET 40X57 (DRAPES) IMPLANT
DRAPE LAPAROTOMY 100X72X124 (DRAPES) ×1 IMPLANT
DRAPE MICROSCOPE SLANT 54X150 (MISCELLANEOUS) ×1 IMPLANT
DRAPE SURG 17X23 STRL (DRAPES) ×2 IMPLANT
DRSG OPSITE POSTOP 3X4 (GAUZE/BANDAGES/DRESSINGS) IMPLANT
ELECT REM PT RETURN 9FT ADLT (ELECTROSURGICAL) ×1
ELECTRODE REM PT RTRN 9FT ADLT (ELECTROSURGICAL) ×1 IMPLANT
GAUZE 4X4 16PLY ~~LOC~~+RFID DBL (SPONGE) IMPLANT
GAUZE SPONGE 4X4 12PLY STRL (GAUZE/BANDAGES/DRESSINGS) ×1 IMPLANT
GLOVE BIO SURGEON STRL SZ 6.5 (GLOVE) ×1 IMPLANT
GLOVE BIOGEL PI IND STRL 6.5 (GLOVE) ×1 IMPLANT
GLOVE ECLIPSE 9.0 STRL (GLOVE) ×1 IMPLANT
GOWN STRL REUS W/ TWL LRG LVL3 (GOWN DISPOSABLE) IMPLANT
GOWN STRL REUS W/ TWL XL LVL3 (GOWN DISPOSABLE) ×1 IMPLANT
GOWN STRL REUS W/TWL 2XL LVL3 (GOWN DISPOSABLE) IMPLANT
GOWN STRL REUS W/TWL LRG LVL3 (GOWN DISPOSABLE)
GOWN STRL REUS W/TWL XL LVL3 (GOWN DISPOSABLE) ×1
KIT BASIN OR (CUSTOM PROCEDURE TRAY) ×1 IMPLANT
KIT TURNOVER KIT B (KITS) ×1 IMPLANT
NDL SPNL 22GX3.5 QUINCKE BK (NEEDLE) ×1 IMPLANT
NEEDLE HYPO 22GX1.5 SAFETY (NEEDLE) ×1 IMPLANT
NEEDLE SPNL 22GX3.5 QUINCKE BK (NEEDLE) IMPLANT
NS IRRIG 1000ML POUR BTL (IV SOLUTION) ×1 IMPLANT
OIL CARTRIDGE MAESTRO DRILL (MISCELLANEOUS) ×1
PACK LAMINECTOMY NEURO (CUSTOM PROCEDURE TRAY) ×1 IMPLANT
PAD ARMBOARD 7.5X6 YLW CONV (MISCELLANEOUS) ×3 IMPLANT
PENCIL BUTTON HOLSTER BLD 10FT (ELECTRODE) IMPLANT
RASP 3.0MM (RASP) IMPLANT
SPIKE FLUID TRANSFER (MISCELLANEOUS) ×1 IMPLANT
SPONGE SURGIFOAM ABS GEL 100 (HEMOSTASIS) ×1 IMPLANT
STRIP CLOSURE SKIN 1/2X4 (GAUZE/BANDAGES/DRESSINGS) ×1 IMPLANT
SUT VIC AB 2-0 CT1 18 (SUTURE) ×1 IMPLANT
SUT VIC AB 3-0 SH 8-18 (SUTURE) ×1 IMPLANT
TOWEL GREEN STERILE (TOWEL DISPOSABLE) ×1 IMPLANT
TOWEL GREEN STERILE FF (TOWEL DISPOSABLE) ×1 IMPLANT
WATER STERILE IRR 1000ML POUR (IV SOLUTION) ×1 IMPLANT

## 2022-03-03 NOTE — Transfer of Care (Signed)
Immediate Anesthesia Transfer of Care Note  Patient: Chase Gonzalez  Procedure(s) Performed: Removal of bilateral Lumbar two pedicle screws (Bilateral: Back)  Patient Location: PACU  Anesthesia Type:General  Level of Consciousness: awake, drowsy, patient cooperative and responds to stimulation  Airway & Oxygen Therapy: Patient Spontanous Breathing and Patient connected to face mask oxygen  Post-op Assessment: Report given to RN and Post -op Vital signs reviewed and stable  Post vital signs: Reviewed and stable  Last Vitals:  Vitals Value Taken Time  BP 142/103 03/03/22 0946  Temp    Pulse 87 03/03/22 0947  Resp 17 03/03/22 0947  SpO2 99 % 03/03/22 0947  Vitals shown include unvalidated device data.  Last Pain:  Vitals:   03/03/22 0814  TempSrc:   PainSc: 8          Complications:  Encounter Notable Events  Notable Event Outcome Phase Comment  Difficult to intubate - expected  Intraprocedure Filed from anesthesia note documentation.

## 2022-03-03 NOTE — Anesthesia Postprocedure Evaluation (Signed)
Anesthesia Post Note  Patient: Chase Gonzalez  Procedure(s) Performed: Removal of bilateral Lumbar two pedicle screws (Bilateral: Back)     Patient location during evaluation: PACU Anesthesia Type: General Level of consciousness: awake and alert Pain management: pain level controlled Vital Signs Assessment: post-procedure vital signs reviewed and stable Respiratory status: spontaneous breathing, nonlabored ventilation, respiratory function stable and patient connected to nasal cannula oxygen Cardiovascular status: blood pressure returned to baseline and stable Postop Assessment: no apparent nausea or vomiting Anesthetic complications: yes   Encounter Notable Events  Notable Event Outcome Phase Comment  Difficult to intubate - expected  Intraprocedure Filed from anesthesia note documentation.    Last Vitals:  Vitals:   03/03/22 1045 03/03/22 1108  BP: 100/77 118/85  Pulse: 70 64  Resp: 10 18  Temp: 36.7 C   SpO2: 95% 96%    Last Pain:  Vitals:   03/03/22 1045  TempSrc:   PainSc: [place] [name]

## 2022-03-03 NOTE — Evaluation (Signed)
Occupational Therapy Evaluation Patient Details Name: Chase Gonzalez MRN: [id number] DOB: Mar 10, 1961 Today's Date: 03/03/2022   History of Present Illness Chase Gonzalez is a 61 yo male who underwent removal of bilateral Lumbar two pedicle screws 9/18. PMHx: neuropathy, osteoarthritis (right TKA 06/2016), neck surgery (bilateral cervical laminectomy C3-4 02/26/17, ACDF 10/15/17), back surgery (bilateral thoracic laminectomy T10-11 with decompression and excision of ruptured disc 02/26/16; L4-5 PLIF/posterior lateral arthrodesis 12/28/18; removal L4-S1 pedicle screws 12/09/19; L2-S11 posterior lateral arthrodesis, revision, segmental pedicle screw instrumentation L2-S1 with iliac fixation 05/22/20).   Clinical Impression   [name] was evaluated s/p the above back surgery, he is typically mod I at baseline with use of a SPC/walking stick when needed. Upon evaluation he had functional limitations due to expected back pain and chronic flexed posture at the hips. Overall pt was supervision A for transfers and mobility with SPC, without SPC he was close min G. Pt has verbalized understanding of back precautions and compensatory techniques to use to maintain during ADLs. Pt does not have acute OT needs. However discussed OP therapy options with pt for chronic back pain and multiple surgeries, pt extremely open and receptive to OP therapy (PT more appropriate than OT).      Recommendations for follow up therapy are one component of a multi-disciplinary discharge planning process, led by the attending physician.  Recommendations may be updated based on patient status, additional functional criteria and insurance authorization.   Follow Up Recommendations  No OT follow up    Assistance Recommended at Discharge Intermittent Supervision/Assistance  Patient can return home with the following A little help with walking and/or transfers;A little help with bathing/dressing/bathroom;Assistance with cooking/housework;Assist for  transportation;Help with stairs or ramp for entrance    Functional Status Assessment  Patient has had a recent decline in their functional status and demonstrates the ability to make significant improvements in function in a reasonable and predictable amount of time.  Equipment Recommendations  None recommended by OT    Recommendations for Other Services       Precautions / Restrictions Precautions Precautions: Back;Fall Precaution Booklet Issued: Yes (comment) Restrictions Weight Bearing Restrictions: No      Mobility Bed Mobility Overal bed mobility: Needs Assistance Bed Mobility: Rolling, Sidelying to Sit, Sit to Sidelying Rolling: Supervision Sidelying to sit: Supervision     Sit to sidelying: Supervision      Transfers Overall transfer level: Needs assistance Equipment used: None Transfers: Sit to/from Stand Sit to Stand: Supervision           General transfer comment: no AD - improved mobility with SPC      Balance Overall balance assessment: Needs assistance Sitting-balance support: Feet supported Sitting balance-Leahy Scale: Good     Standing balance support: Single extremity supported, During functional activity Standing balance-Leahy Scale: Fair                             ADL either performed or assessed with clinical judgement   ADL Overall ADL's : Needs assistance/impaired Eating/Feeding: Independent;Sitting   Grooming: Modified independent;Standing Grooming Details (indicate cue type and reason): able to recall compensatory tehcniques Upper Body Bathing: Set up;Sitting   Lower Body Bathing: Supervison/ safety;Sit to/from stand   Upper Body Dressing : Set up;Sitting   Lower Body Dressing: Min guard;Sit to/from stand   Toilet Transfer: Supervision/safety;Ambulation   Toileting- Clothing Manipulation and Hygiene: Supervision/safety;Sitting/lateral lean       Functional mobility during ADLs: Supervision/safety General  ADL Comments: reviewed compensatory tehcniques     Vision Baseline Vision/History: 0 No visual deficits Vision Assessment?: No apparent visual deficits     Perception     Praxis      Pertinent Vitals/Pain Pain Assessment Pain Assessment: Faces Faces Pain Scale: Hurts a little bit Pain Location: back Pain Descriptors / Indicators: Discomfort, Grimacing Pain Intervention(s): Limited activity within patient's tolerance, Monitored during session     Hand Dominance Right   Extremity/Trunk Assessment Upper Extremity Assessment Upper Extremity Assessment: Overall WFL for tasks assessed   Lower Extremity Assessment Lower Extremity Assessment: Generalized weakness (reports numbness/neuropathy in bilat feet)   Cervical / Trunk Assessment Cervical / Trunk Assessment: Kyphotic;Other exceptions;Back Surgery   Communication Communication Communication: No difficulties   Cognition Arousal/Alertness: Awake/alert Behavior During Therapy: WFL for tasks assessed/performed Overall Cognitive Status: Within Functional Limits for tasks assessed                                 General Comments: great recall of precautions. reports that he is intereseted in OP PT     General Comments  VSS on RA, wife present and supportive     Home Living Family/patient expects to be discharged to:: Private residence Living Arrangements: Spouse/significant other;Children Available Help at Discharge: Family;Available 24 hours/day Type of Home: House Home Access: Stairs to enter [employer] of Steps: 1   Home Layout: One level     Bathroom Shower/Tub: Producer, television/film/video: Handicapped height     Home Equipment: Agricultural consultant (2 wheels);Rollator (4 wheels);Cane - single point;BSC/3in1;Shower seat;Grab bars - tub/shower;Adaptive equipment Adaptive Equipment: Reacher;Sock aid        Prior Functioning/Environment Prior Level of Function : Independent/Modified  Independent;Working/employed;Driving             Mobility Comments: walking stick when needed ADLs Comments: drives, works        OT Problem List: Decreased strength;Decreased range of motion;Decreased activity tolerance;Impaired balance (sitting and/or standing);Pain      OT Treatment/Interventions: Self-care/ADL training;Therapeutic exercise;DME and/or AE instruction;Therapeutic activities;Patient/family education;Balance training    OT Goals(Current goals can be found in the care plan section) Acute Rehab OT Goals Patient Stated Goal: home today OT Goal Formulation: With patient Time For Goal Achievement: 03/03/22 Potential to Achieve Goals: Good  OT Frequency: Min 2X/week       AM-PAC OT "6 Clicks" Daily Activity     Outcome Measure Help from another person eating meals?: None Help from another person taking care of personal grooming?: A Little Help from another person toileting, which includes using toliet, bedpan, or urinal?: A Little Help from another person bathing (including washing, rinsing, drying)?: A Little Help from another person to put on and taking off regular upper body clothing?: None Help from another person to put on and taking off regular lower body clothing?: A Little 6 Click Score: 20   End of Session Equipment Utilized During Treatment: Gait belt;Other (comment) [facility]) Nurse Communication: Mobility status  Activity Tolerance: Patient tolerated treatment well Patient left: in bed;with call bell/phone within reach;with family/visitor present  OT Visit Diagnosis: Unsteadiness on feet (R26.81);Other abnormalities of gait and mobility (R26.89);Muscle weakness (generalized) (M62.81)                Time: 5885-0277 OT Time Calculation (min): 17 min Charges:  OT General Charges $OT Visit: 1 Visit OT Evaluation $OT Eval Moderate Complexity: 1 Mod   Tiauna Whisnant  D Causey 03/03/2022, 4:58 PM

## 2022-03-03 NOTE — H&P (Signed)
Chase Gonzalez is an 61 y.o. male.   Chief Complaint: Back pain HPI: 61 year old male status post multiple spinal surgeries including L2 to pelvis decompression and fusion surgery.  Patient presents now with increased back pain and some pain across his flanks bilaterally.  He has no new numbness paresthesias or weakness.  He has chronic gait instability.  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  As this level is not grossly unstable and was not required to be part of his original fusion construct but was added for extra points of fixation I decided to simply remove the L2 pedicle screws and hopefully improve his symptoms.  Past Medical History:  Diagnosis Date   Anxiety    Arthritis    Constipation due to opioid therapy    Depression    due to pain   History of kidney stones    history of previous kidney stones, several    Neuropathy     Past Surgical History:  Procedure Laterality Date   ANTERIOR CERVICAL DECOMP/DISCECTOMY FUSION  10/15/2017   Dr. Annette Stable   APPLICATION OF ROBOTIC ASSISTANCE FOR SPINAL PROCEDURE N/A 05/22/2020   Procedure: APPLICATION OF ROBOTIC ASSISTANCE FOR SPINAL PROCEDURE;  Surgeon: Earnie Larsson, MD;  Location: [place];  Service: Neurosurgery;  Laterality: N/A;   HARDWARE REMOVAL N/A 12/09/2019   Procedure: Removal of Lumbar Four, Lumbar Five and Sacral One pedicle screws;  Surgeon: Earnie Larsson, MD;  Location: [place];  Service: Neurosurgery;  Laterality: N/A;  Removal of Lumbar Four, Lumbar Five and Sacral One pedicle screws   JOINT REPLACEMENT Left    Knee   LAMINECTOMY WITH POSTERIOR LATERAL ARTHRODESIS LEVEL 4 N/A 05/15/2020   Procedure: CANCELLED PROCEDURE;  Surgeon: Earnie Larsson, MD;  Location: [place];  Service: Neurosurgery;  Laterality: N/A;   LAMINECTOMY WITH POSTERIOR LATERAL ARTHRODESIS LEVEL 4 N/A 05/22/2020   Procedure: Posterior lateral fusion - Lumbar  two-Sacral one with Sacral two AIar/iIliac screws - Mazor;  Surgeon: Earnie Larsson, MD;  Location: [place];  Service: Neurosurgery;  Laterality: N/A;   posterior cervical disc fusion  02/26/2017   ROTATOR CUFF REPAIR Bilateral    THORACIC DISC SURGERY     2017, 2 ruptured discs, had emergency surgery    No family history on file. Social History:  reports that he has never smoked. His smokeless tobacco use includes chew. He reports that he does not currently use alcohol. He reports that he does not currently use drugs.  Allergies: No Known Allergies  Medications Prior to Admission  Medication Sig Dispense Refill   acetaminophen (TYLENOL) 500 MG tablet Take 1,000 mg by mouth every 8 (eight) hours as needed for moderate pain.      Ascorbic Acid (VITAMIN C PO) Take 1 tablet by mouth daily.     docusate sodium (COLACE) 100 MG capsule Take 200 mg by mouth daily.      DULoxetine (CYMBALTA) 60 MG capsule Take 60 mg by mouth daily.     FLUoxetine (PROZAC) 10 MG capsule Take 10 mg by mouth daily.     gabapentin (NEURONTIN) 300 MG capsule Take 300 mg by mouth at bedtime.     Glucosamine HCl (GLUCOSAMINE PO) Take 1 tablet by mouth daily.     ibuprofen (ADVIL) 200 MG tablet Take 600 mg by mouth every 6 (six) hours as needed for headache or mild pain.     meloxicam (MOBIC)  15 MG tablet Take 15 mg by mouth daily as needed for pain.     Multiple Vitamins-Minerals (MULTIVITAMIN WITH MINERALS) tablet Take 1 tablet by mouth daily. Mens     omega-3 acid ethyl esters (LOVAZA) 1 g capsule Take 2 g by mouth 2 (two) times daily.     oxyCODONE 10 MG TABS Take 1 tablet (10 mg total) by mouth every 3 (three) hours as needed for severe pain ((score 7 to 10)). (Patient taking differently: Take 10-20 mg by mouth See admin instructions. 20 mg at bedtime + 10 mg during the day if needed for pain.) 40 tablet 0   polyethylene glycol (MIRALAX / GLYCOLAX) 17 g packet Take 17 g by mouth daily.      No results found for this or  any previous visit (from the past 48 hour(s)). No results found.  Pertinent items noted in HPI and remainder of comprehensive ROS otherwise negative.  Blood pressure (!) 150/98, pulse 76, temperature 98.5 F (36.9 C), temperature source Oral, resp. rate 18, height 5\' 11"  (1.803 m), weight 93 kg, SpO2 94 %.  Patient is awake and alert.  He is oriented and appropriate.  Speech is fluent.  Judgment insight are intact.  Cranial nerve function normal bilateral.  Motor examination reveals intact motor strength bilaterally.  Tone markedly increased.  Sensation with a relative sensory level around C6.  Deep Temrex's are hyperactive.  Examination head ears eyes nose throat is unremarkable her chest and abdomen are benign.  Extremities are free from injury deformity. 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  Plan reexploration of upper lumbar fusion segment with removal of L2 pedicle screws.  Risks and benefits been explained.  Patient wishes to proceed.  [name] 03/03/2022, 7:56 AM

## 2022-03-03 NOTE — Anesthesia Procedure Notes (Signed)
Procedure Name: Intubation Date/Time: 03/03/2022 8:23 AM  Performed by: Cathren Harsh, CRNAPre-anesthesia Checklist: Patient identified, Emergency Drugs available, Suction available and Patient being monitored Patient Re-evaluated:Patient Re-evaluated prior to induction Oxygen Delivery Method: Circle System Utilized Preoxygenation: Pre-oxygenation with 100% oxygen Induction Type: IV induction Ventilation: Mask ventilation without difficulty Laryngoscope Size: Glidescope and 4 Grade View: Grade I Tube type: Oral Tube size: 7.5 mm Number of attempts: 1 Airway Equipment and Method: Stylet and Oral airway Placement Confirmation: ETT inserted through vocal cords under direct vision, positive ETCO2 and breath sounds checked- equal and bilateral Secured at: 23 cm Tube secured with: Tape Dental Injury: Teeth and Oropharynx as per pre-operative assessment  Difficulty Due To: Difficulty was anticipated and Difficult Airway- due to reduced neck mobility

## 2022-03-03 NOTE — Discharge Instructions (Signed)

## 2022-03-03 NOTE — Op Note (Signed)
Date of procedure: 03/03/2022  Date of dictation: Same  Service: Neurosurgery  Preoperative diagnosis: L2 pedicle screw loosening with pain.  Postoperative diagnosis: Same  Procedure Name: Reexploration of lumbar fusion with removal of painful hardware, L2 pedicle screws bilaterally  Surgeon:Andree Golphin A.Merland Holness, M.D.  Asst. Surgeon: None  Anesthesia: General  Indication: 61 year old male status post L2 to pelvis decompression and fusion.  Patient with worsening back pain.  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  As this was never a level that required fusion the decision been made to remove the L2 pedicle screws as he is now 6 solidly fused from L3 to his pelvis.  Operative note: After induction of anesthesia, patient position prone onto Wilson frame and properly padded.  Lumbar region prepped and draped sterilely.  Incision made overlying the L2 pedicle screws bilaterally.  Dissection performed exposing the pedicle screw and the proximal and distal aspect of the rod.  The rods were cut using a titanium bit.  The screw and rod segment were removed.  Wounds were irrigated.  Hemostasis was achieved.  Wounds then closed in layers of Vicryl sutures.  Steri-Strips and sterile dressing were applied.  No apparent complications.  Patient tolerated the procedure well and he returns to the recovery room postop.

## 2022-03-03 NOTE — Progress Notes (Addendum)
Patient alert and oriented, mae's well, voiding adequate amount of urine, swallowing without difficulty,  c/o pain at time of discharge and medication given. Patient discharged home with family. Script and discharged instructions given to patient. Patient and family stated understanding of instructions given. Patient has an appointment with Dr. Annette Stable in 2 weeks

## 2022-03-03 NOTE — Discharge Summary (Signed)
Physician Discharge Summary  Patient ID: GURLEY CLIMER MRN: [id number] DOB/AGE: 1961/01/28 61 y.o.  Admit date: 03/03/2022 Discharge date: 03/03/2022  Admission Diagnoses:  Discharge Diagnoses:  Principal Problem:   Painful orthopaedic hardware [facility])   Discharged Condition: good  Hospital Course: Patient admitted to the hospital where he underwent uncomplicated removal of his loosened painful hardware at L2.  Postoperatively doing well.  No new neurologic deficits.  Standing ambulating and voiding without difficulty.  Ready for discharge home.  Consults:   Significant Diagnostic Studies:   Treatments:   Discharge Exam: Blood pressure 130/86, pulse 99, temperature 98.5 F (36.9 C), temperature source Oral, resp. rate 18, height 5\' 11"  (1.803 m), weight 93 kg, SpO2 94 %. Awake and alert.  Oriented and appropriate.  Motor and sensory function stable.  Gait spastic but unchanged.  Wounds clean and dry.  Disposition: Discharge disposition: 01-Home or Self Care        Allergies as of 03/03/2022   No Known Allergies      Medication List     TAKE these medications    acetaminophen 500 MG tablet Commonly known as: TYLENOL Take 1,000 mg by mouth every 8 (eight) hours as needed for moderate pain.   docusate sodium 100 MG capsule Commonly known as: COLACE Take 200 mg by mouth daily.   DULoxetine 60 MG capsule Commonly known as: CYMBALTA Take 60 mg by mouth daily.   FLUoxetine 10 MG capsule Commonly known as: PROZAC Take 10 mg by mouth daily.   gabapentin 300 MG capsule Commonly known as: NEURONTIN Take 300 mg by mouth at bedtime.   GLUCOSAMINE PO Take 1 tablet by mouth daily.   ibuprofen 200 MG tablet Commonly known as: ADVIL Take 600 mg by mouth every 6 (six) hours as needed for headache or mild pain.   meloxicam 15 MG tablet Commonly known as: MOBIC Take 15 mg by mouth daily as needed for pain.   multivitamin with minerals tablet Take 1 tablet by  mouth daily. Mens   omega-3 acid ethyl esters 1 g capsule Commonly known as: LOVAZA Take 2 g by mouth 2 (two) times daily.   Oxycodone HCl 10 MG Tabs Take 1-2 tablets (10-20 mg total) by mouth See admin instructions. 20 mg at bedtime + 10 mg during the day if needed for pain.   polyethylene glycol 17 g packet Commonly known as: MIRALAX / GLYCOLAX Take 17 g by mouth daily.   VITAMIN C PO Take 1 tablet by mouth daily.         SignedCooper Render Devon Pretty 03/03/2022, 6:04 PM

## 2022-03-04 ENCOUNTER — Encounter (HOSPITAL_COMMUNITY): Payer: Self-pay | Admitting: Neurosurgery

## 2022-08-21 ENCOUNTER — Other Ambulatory Visit: Payer: Self-pay | Admitting: Neurosurgery

## 2022-09-01 NOTE — Pre-Procedure Instructions (Signed)
Surgical Instructions    Your procedure is scheduled on September 09, 2022.  Report to [facility] Main Entrance "A" at 6:00 A.M., then check in with the Admitting office.  Call this number if you have problems the morning of surgery:  [phone]  If you have any questions prior to your surgery date call [phone]: Open Monday-Friday 8am-4pm If you experience any cold or flu symptoms such as cough, fever, chills, shortness of breath, etc. between now and your scheduled surgery, please notify us at the above number.     Remember:  Do not eat or drink after midnight the night before your surgery      Take these medicines the morning of surgery with A SIP OF WATER:  docusate sodium (COLACE)   DULoxetine (CYMBALTA)   FLUoxetine (PROZAC)   acetaminophen (TYLENOL) - may take if needed  Oxycodone - may take if needed    As of today, STOP taking any Aspirin (unless otherwise instructed by your surgeon) Aleve, Naproxen, Ibuprofen, Motrin, Advil, Goody's, BC's, all herbal medications, fish oil, and all vitamins. This includes your medication: meloxicam (MOBIC).                     Do NOT Smoke (Tobacco/Vaping) for 24 hours prior to your procedure.  If you use a CPAP at night, you may bring your mask/headgear for your overnight stay.   Contacts, glasses, piercing's, hearing aid's, dentures or partials may not be worn into surgery, please bring cases for these belongings.    For patients admitted to the hospital, discharge time will be determined by your treatment team.   Patients discharged the day of surgery will not be allowed to drive home, and someone needs to stay with them for 24 hours.  SURGICAL WAITING ROOM VISITATION Patients having surgery or a procedure may have no more than 2 support people in the waiting area - these visitors may rotate.   Children under the age of 75 must have an adult with them who is not the patient. If the patient needs to stay at the hospital during  part of their recovery, the visitor guidelines for inpatient rooms apply. Pre-op nurse will coordinate an appropriate time for 1 support person to accompany patient in pre-op.  This support person may not rotate.   Please refer to the [facility] website for the visitor guidelines for Inpatients (after your surgery is over and you are in a regular room).    Special instructions:   [facility]- Preparing For Surgery  Before surgery, you can play an important role. Because skin is not sterile, your skin needs to be as free of germs as possible. You can reduce the number of germs on your skin by washing with CHG (chlorahexidine gluconate) Soap before surgery.  CHG is an antiseptic cleaner which kills germs and bonds with the skin to continue killing germs even after washing.    Oral Hygiene is also important to reduce your risk of infection.  Remember - BRUSH YOUR TEETH THE MORNING OF SURGERY WITH YOUR REGULAR TOOTHPASTE  Please do not use if you have an allergy to CHG or antibacterial soaps. If your skin becomes reddened/irritated stop using the CHG.  Do not shave (including legs and underarms) for at least 48 hours prior to first CHG shower. It is OK to shave your face.  Please follow these instructions carefully.   Shower the NIGHT BEFORE SURGERY and the MORNING OF SURGERY  If you chose to wash  your hair, wash your hair first as usual with your normal shampoo.  After you shampoo, rinse your hair and body thoroughly to remove the shampoo.  Use CHG Soap as you would any other liquid soap. You can apply CHG directly to the skin and wash gently with a scrungie or a clean washcloth.   Apply the CHG Soap to your body ONLY FROM THE NECK DOWN.  Do not use on open wounds or open sores. Avoid contact with your eyes, ears, mouth and genitals (private parts). Wash Face and genitals (private parts)  with your normal soap.   Wash thoroughly, paying special attention to the area where your surgery will  be performed.  Thoroughly rinse your body with warm water from the neck down.  DO NOT shower/wash with your normal soap after using and rinsing off the CHG Soap.  Pat yourself dry with a CLEAN TOWEL.  Wear CLEAN PAJAMAS to bed the night before surgery  Place CLEAN SHEETS on your bed the night before your surgery  DO NOT SLEEP WITH PETS.   Day of Surgery: Take a shower with CHG soap. Do not wear jewelry or makeup Do not wear lotions, powders, perfumes/colognes, or deodorant. Do not shave 48 hours prior to surgery.  Men may shave face and neck. Do not bring valuables to the hospital.  [facility] is not responsible for any belongings or valuables. Do not wear nail polish, gel polish, artificial nails, or any other type of covering on natural nails (fingers and toes) If you have artificial nails or gel coating that need to be removed by a nail salon, please have this removed prior to surgery. Artificial nails or gel coating may interfere with anesthesia's ability to adequately monitor your vital signs.  Wear Clean/Comfortable clothing the morning of surgery Remember to brush your teeth WITH YOUR REGULAR TOOTHPASTE.   Please read over the following fact sheets that you were given.    If you received a COVID test during your pre-op visit  it is requested that you wear a mask when out in public, stay away from anyone that may not be feeling well and notify your surgeon if you develop symptoms. If you have been in contact with anyone that has tested positive in the last 10 days please notify you surgeon.

## 2022-09-02 ENCOUNTER — Encounter (HOSPITAL_COMMUNITY)
Admission: RE | Admit: 2022-09-02 | Discharge: 2022-09-02 | Disposition: A | Discharge status: Home / Self Care | Payer: BC Managed Care – PPO | Source: Ambulatory Visit | Attending: Neurosurgery | Admitting: Neurosurgery

## 2022-09-02 ENCOUNTER — Encounter (HOSPITAL_COMMUNITY): Payer: Self-pay

## 2022-09-02 ENCOUNTER — Other Ambulatory Visit: Payer: Self-pay

## 2022-09-02 VITALS — BP 138/92 | HR 90 | Temp 98.0°F | Resp 17 | Ht 71.0 in | Wt 208.0 lb

## 2022-09-02 DIAGNOSIS — Z01818 Encounter for other preprocedural examination: Secondary | ICD-10-CM

## 2022-09-02 DIAGNOSIS — Z01812 Encounter for preprocedural laboratory examination: Secondary | ICD-10-CM | POA: Diagnosis not present

## 2022-09-02 LAB — TYPE AND SCREEN
ABO/RH(D): B NEG
Antibody Screen: NEGATIVE

## 2022-09-02 LAB — CBC
HCT: 47.7 % (ref 39.0–52.0)
Hemoglobin: 16 g/dL (ref 13.0–17.0)
MCH: 28.5 pg (ref 26.0–34.0)
MCHC: 33.5 g/dL (ref 30.0–36.0)
MCV: 85 fL (ref 80.0–100.0)
Platelets: 243 10*3/uL (ref 150–400)
RBC: 5.61 MIL/uL (ref 4.22–5.81)
RDW: 13 % (ref 11.5–15.5)
WBC: 7.8 10*3/uL (ref 4.0–10.5)
nRBC: 0 % (ref 0.0–0.2)

## 2022-09-02 LAB — SURGICAL PCR SCREEN
MRSA, PCR: NEGATIVE
Staphylococcus aureus: NEGATIVE

## 2022-09-02 NOTE — Progress Notes (Signed)
PCP - Dr. Guadalupe Maple Cardiologist - Denies  PPM/ICD - Denies Device Orders - n/a Rep Notified - n/a  Chest x-ray - n/a EKG - Per pt, had one many years ago by mistake (the office didn't need to do it) but result was normal Stress Test - Denies ECHO - Denies Cardiac Cath - Denies  Sleep Study - Denies CPAP - n/a  No DM  Last dose of GLP1 agonist- n/a  GLP1 instructions: n/a  Blood Thinner Instructions: n/a Aspirin Instructions: n/a  NPO after midnight  COVID TEST- n/a   Anesthesia review: No.   Patient denies shortness of breath, fever, cough and chest pain at PAT appointment. Pt denies any respiratory illness/infection in the last two months.   All instructions explained to the patient, with a verbal understanding of the material. Patient agrees to go over the instructions while at home for a better understanding. Patient also instructed to self quarantine after being tested for COVID-19. The opportunity to ask questions was provided.

## 2022-09-08 NOTE — Anesthesia Preprocedure Evaluation (Signed)
Anesthesia Evaluation  Patient identified by MRN, date of birth, ID band Patient awake    Reviewed: Allergy & Precautions, NPO status , Patient's Chart, lab work & pertinent test results  Airway Mallampati: III  TM Distance: >3 FB Neck ROM: Full    Dental  (+) Dental Advisory Given, Teeth Intact   Pulmonary neg pulmonary ROS   Pulmonary exam normal breath sounds clear to auscultation       Cardiovascular negative cardio ROS Normal cardiovascular exam Rhythm:Regular Rate:Normal     Neuro/Psych  PSYCHIATRIC DISORDERS Anxiety Depression    hardware loosening bilateral L2 pedicle screws  neck surgery (bilateral cervical laminectomy C3-4 02/26/17, ACDF 10/15/17), back surgery (bilateral thoracic laminectomy T10-11 with decompression and excision of ruptured disc 02/26/16; L4-5 PLIF/posterior lateral arthrodesis 12/28/18; removal L4-S1 pedicle screws 12/09/19; L2-S11 posterior lateral arthrodesis, revision, segmental pedicle screw instrumentation L2-S1 with iliac fixation 05/22/20)    GI/Hepatic negative GI ROS, Neg liver ROS,,,  Endo/Other  negative endocrine ROS    Renal/GU negative Renal ROS     Musculoskeletal  (+) Arthritis ,    Abdominal   Peds  Hematology negative hematology ROS (+)   Anesthesia Other Findings   Reproductive/Obstetrics                             Anesthesia Physical Anesthesia Plan  ASA: 2  Anesthesia Plan: General   Post-op Pain Management: Tylenol PO (pre-op)*, Ketamine IV* and Gabapentin PO (pre-op)*   Induction: Intravenous  PONV Risk Score and Plan: 3 and Midazolam, Dexamethasone, Ondansetron and Treatment may vary due to age or medical condition  Airway Management Planned: Oral ETT and Video Laryngoscope Planned  Additional Equipment:   Intra-op Plan:   Post-operative Plan: Extubation in OR  Informed Consent: I have reviewed the patients History and Physical,  chart, labs and discussed the procedure including the risks, benefits and alternatives for the proposed anesthesia with the patient or authorized representative who has indicated his/her understanding and acceptance.     Dental advisory given  Plan Discussed with: CRNA  Anesthesia Plan Comments: (PAT note written 02/18/2022 by Myra Gianotti, PA-C. )        Anesthesia Quick Evaluation

## 2022-09-09 ENCOUNTER — Inpatient Hospital Stay (HOSPITAL_COMMUNITY): Payer: BC Managed Care – PPO

## 2022-09-09 ENCOUNTER — Encounter (HOSPITAL_COMMUNITY): Payer: Self-pay | Admitting: Neurosurgery

## 2022-09-09 ENCOUNTER — Inpatient Hospital Stay (HOSPITAL_COMMUNITY): Payer: BC Managed Care – PPO | Admitting: Anesthesiology

## 2022-09-09 ENCOUNTER — Other Ambulatory Visit: Payer: Self-pay

## 2022-09-09 ENCOUNTER — Inpatient Hospital Stay (HOSPITAL_COMMUNITY)
Admission: RE | Admit: 2022-09-09 | Discharge: 2022-09-10 | DRG: 460 | Disposition: A | Discharge status: Home Health | Payer: BC Managed Care – PPO | Attending: Neurosurgery | Admitting: Neurosurgery

## 2022-09-09 ENCOUNTER — Inpatient Hospital Stay (HOSPITAL_COMMUNITY)
Admission: RE | Disposition: A | Discharge status: Home Health | Payer: Self-pay | Source: Home / Self Care | Attending: Neurosurgery

## 2022-09-09 DIAGNOSIS — G629 Polyneuropathy, unspecified: Secondary | ICD-10-CM | POA: Diagnosis present

## 2022-09-09 DIAGNOSIS — M48061 Spinal stenosis, lumbar region without neurogenic claudication: Secondary | ICD-10-CM | POA: Diagnosis present

## 2022-09-09 DIAGNOSIS — R2681 Unsteadiness on feet: Secondary | ICD-10-CM | POA: Diagnosis present

## 2022-09-09 DIAGNOSIS — F419 Anxiety disorder, unspecified: Secondary | ICD-10-CM | POA: Diagnosis present

## 2022-09-09 DIAGNOSIS — T402X5A Adverse effect of other opioids, initial encounter: Secondary | ICD-10-CM | POA: Diagnosis present

## 2022-09-09 DIAGNOSIS — Z79899 Other long term (current) drug therapy: Secondary | ICD-10-CM

## 2022-09-09 DIAGNOSIS — K5903 Drug induced constipation: Secondary | ICD-10-CM | POA: Diagnosis present

## 2022-09-09 DIAGNOSIS — M1990 Unspecified osteoarthritis, unspecified site: Secondary | ICD-10-CM | POA: Diagnosis present

## 2022-09-09 DIAGNOSIS — Z87891 Personal history of nicotine dependence: Secondary | ICD-10-CM

## 2022-09-09 DIAGNOSIS — Z79891 Long term (current) use of opiate analgesic: Secondary | ICD-10-CM

## 2022-09-09 DIAGNOSIS — M5136 Other intervertebral disc degeneration, lumbar region: Secondary | ICD-10-CM | POA: Diagnosis present

## 2022-09-09 DIAGNOSIS — M4316 Spondylolisthesis, lumbar region: Secondary | ICD-10-CM | POA: Diagnosis present

## 2022-09-09 DIAGNOSIS — Z87442 Personal history of urinary calculi: Secondary | ICD-10-CM

## 2022-09-09 DIAGNOSIS — F32A Depression, unspecified: Secondary | ICD-10-CM | POA: Diagnosis present

## 2022-09-09 DIAGNOSIS — G992 Myelopathy in diseases classified elsewhere: Secondary | ICD-10-CM | POA: Diagnosis present

## 2022-09-09 DIAGNOSIS — M549 Dorsalgia, unspecified: Secondary | ICD-10-CM | POA: Diagnosis present

## 2022-09-09 HISTORY — PX: ANTERIOR LAT LUMBAR FUSION: SHX1168

## 2022-09-09 SURGERY — ANTERIOR LATERAL LUMBAR FUSION 2 LEVELS
Anesthesia: General | Site: Spine Lumbar | Laterality: Left

## 2022-09-09 MED ORDER — DEXAMETHASONE SODIUM PHOSPHATE 10 MG/ML IJ SOLN
INTRAMUSCULAR | Status: DC | PRN
  Administered 2022-09-09: 10 mg via INTRAVENOUS

## 2022-09-09 MED ORDER — ORAL CARE MOUTH RINSE: 15.0000 mL | Freq: Once | OROMUCOSAL | Status: AC

## 2022-09-09 MED ORDER — SODIUM CHLORIDE 0.9 % IV SOLN
250.0000 mL | INTRAVENOUS | Status: DC
  Administered 2022-09-09: 250 mL via INTRAVENOUS

## 2022-09-09 MED ORDER — KETAMINE HCL 10 MG/ML IJ SOLN
INTRAMUSCULAR | Status: DC | PRN
  Administered 2022-09-09: 50 mg via INTRAVENOUS

## 2022-09-09 MED ORDER — 0.9 % SODIUM CHLORIDE (POUR BTL) OPTIME
TOPICAL | Status: DC | PRN
  Administered 2022-09-09: 1000 mL

## 2022-09-09 MED ORDER — PHENYLEPHRINE HCL-NACL 20-0.9 MG/250ML-% IV SOLN
INTRAVENOUS | Status: DC | PRN
  Administered 2022-09-09: 100 ug/min via INTRAVENOUS
  Administered 2022-09-09: 50 ug/min via INTRAVENOUS

## 2022-09-09 MED ORDER — ONDANSETRON HCL 4 MG/2ML IJ SOLN
INTRAMUSCULAR | Status: DC | PRN
  Administered 2022-09-09: 4 mg via INTRAVENOUS

## 2022-09-09 MED ORDER — PROPOFOL 500 MG/50ML IV EMUL
INTRAVENOUS | Status: DC | PRN
  Administered 2022-09-09: 50 ug/kg/min via INTRAVENOUS

## 2022-09-09 MED ORDER — HYDROMORPHONE HCL 1 MG/ML IJ SOLN: 1.0000 mg | INTRAMUSCULAR | Status: DC | PRN

## 2022-09-09 MED ORDER — PROPOFOL 10 MG/ML IV BOLUS
INTRAVENOUS | Status: DC | PRN
  Administered 2022-09-09: 160 mg via INTRAVENOUS

## 2022-09-09 MED ORDER — PROMETHAZINE HCL 25 MG/ML IJ SOLN: 6.2500 mg | INTRAMUSCULAR | Status: DC | PRN

## 2022-09-09 MED ORDER — CEFAZOLIN SODIUM-DEXTROSE 1-4 GM/50ML-% IV SOLN
1.0000 g | Freq: Three times a day (TID) | INTRAVENOUS | Status: AC
  Administered 2022-09-09 (×2): 1 g via INTRAVENOUS
  Filled 2022-09-09 (×2): qty 50

## 2022-09-09 MED ORDER — FENTANYL CITRATE (PF) 250 MCG/5ML IJ SOLN
INTRAMUSCULAR | Status: DC | PRN
  Administered 2022-09-09: 100 ug via INTRAVENOUS
  Administered 2022-09-09: 50 ug via INTRAVENOUS
  Administered 2022-09-09: 100 ug via INTRAVENOUS

## 2022-09-09 MED ORDER — LIDOCAINE 2% (20 MG/ML) 5 ML SYRINGE
INTRAMUSCULAR | Status: DC | PRN
  Administered 2022-09-09: 100 mg via INTRAVENOUS

## 2022-09-09 MED ORDER — THROMBIN (RECOMBINANT) 5000 UNITS EX SOLR
CUTANEOUS | Status: DC | PRN
  Administered 2022-09-09: 10 mL via TOPICAL

## 2022-09-09 MED ORDER — KETOROLAC TROMETHAMINE 15 MG/ML IJ SOLN
30.0000 mg | Freq: Four times a day (QID) | INTRAMUSCULAR | Status: DC
  Administered 2022-09-09 – 2022-09-10 (×4): 30 mg via INTRAVENOUS
  Filled 2022-09-09 (×4): qty 2

## 2022-09-09 MED ORDER — SODIUM CHLORIDE 0.9% FLUSH
3.0000 mL | Freq: Two times a day (BID) | INTRAVENOUS | Status: DC
  Administered 2022-09-09: 3 mL via INTRAVENOUS

## 2022-09-09 MED ORDER — IBUPROFEN 600 MG PO TABS: 600.0000 mg | ORAL_TABLET | Freq: Four times a day (QID) | ORAL | Status: DC | PRN

## 2022-09-09 MED ORDER — SUCCINYLCHOLINE CHLORIDE 200 MG/10ML IV SOSY
PREFILLED_SYRINGE | INTRAVENOUS | Status: DC | PRN
  Administered 2022-09-09: 100 mg via INTRAVENOUS

## 2022-09-09 MED ORDER — CHLORHEXIDINE GLUCONATE 0.12 % MT SOLN
15.0000 mL | Freq: Once | OROMUCOSAL | Status: AC
  Administered 2022-09-09: 15 mL via OROMUCOSAL
  Filled 2022-09-09: qty 15

## 2022-09-09 MED ORDER — OXYCODONE HCL 5 MG PO TABS
5.0000 mg | ORAL_TABLET | Freq: Once | ORAL | Status: AC | PRN
  Administered 2022-09-09: 5 mg via ORAL

## 2022-09-09 MED ORDER — OXYCODONE HCL 5 MG PO TABS
10.0000 mg | ORAL_TABLET | ORAL | Status: DC | PRN
  Administered 2022-09-09 – 2022-09-10 (×6): 10 mg via ORAL
  Filled 2022-09-09 (×6): qty 2

## 2022-09-09 MED ORDER — FENTANYL CITRATE (PF) 250 MCG/5ML IJ SOLN
INTRAMUSCULAR | Status: AC
  Filled 2022-09-09: qty 5

## 2022-09-09 MED ORDER — BUPIVACAINE HCL (PF) 0.25 % IJ SOLN
INTRAMUSCULAR | Status: DC | PRN
  Administered 2022-09-09: 10 mL

## 2022-09-09 MED ORDER — PHENYLEPHRINE HCL-NACL 20-0.9 MG/250ML-% IV SOLN
INTRAVENOUS | Status: AC
  Filled 2022-09-09: qty 250

## 2022-09-09 MED ORDER — THROMBIN 5000 UNITS EX SOLR
CUTANEOUS | Status: AC
  Filled 2022-09-09: qty 10000

## 2022-09-09 MED ORDER — HYDROCODONE-ACETAMINOPHEN 10-325 MG PO TABS: 1.0000 | ORAL_TABLET | ORAL | Status: DC | PRN

## 2022-09-09 MED ORDER — ACETAMINOPHEN 650 MG RE SUPP: 650.0000 mg | RECTAL | Status: DC | PRN

## 2022-09-09 MED ORDER — HYDROMORPHONE HCL 1 MG/ML IJ SOLN
INTRAMUSCULAR | Status: AC
  Filled 2022-09-09: qty 1

## 2022-09-09 MED ORDER — POLYETHYLENE GLYCOL 3350 17 G PO PACK
17.0000 g | PACK | Freq: Every day | ORAL | Status: DC | PRN
  Administered 2022-09-09: 17 g via ORAL
  Filled 2022-09-09: qty 1

## 2022-09-09 MED ORDER — MENTHOL 3 MG MT LOZG: 1.0000 | LOZENGE | OROMUCOSAL | Status: DC | PRN

## 2022-09-09 MED ORDER — KETAMINE HCL 50 MG/5ML IJ SOSY
PREFILLED_SYRINGE | INTRAMUSCULAR | Status: AC
  Filled 2022-09-09: qty 5

## 2022-09-09 MED ORDER — AMISULPRIDE (ANTIEMETIC) 5 MG/2ML IV SOLN
INTRAVENOUS | Status: AC
  Filled 2022-09-09: qty 4

## 2022-09-09 MED ORDER — MIDAZOLAM HCL 2 MG/2ML IJ SOLN
INTRAMUSCULAR | Status: AC
  Filled 2022-09-09: qty 2

## 2022-09-09 MED ORDER — ACETAMINOPHEN 325 MG PO TABS
650.0000 mg | ORAL_TABLET | ORAL | Status: DC | PRN
  Administered 2022-09-09 – 2022-09-10 (×3): 650 mg via ORAL
  Filled 2022-09-09 (×3): qty 2

## 2022-09-09 MED ORDER — PHENYLEPHRINE 80 MCG/ML (10ML) SYRINGE FOR IV PUSH (FOR BLOOD PRESSURE SUPPORT)
PREFILLED_SYRINGE | INTRAVENOUS | Status: DC | PRN
  Administered 2022-09-09: 80 ug via INTRAVENOUS
  Administered 2022-09-09: 160 ug via INTRAVENOUS

## 2022-09-09 MED ORDER — DOCUSATE SODIUM 100 MG PO CAPS
200.0000 mg | ORAL_CAPSULE | Freq: Every day | ORAL | Status: DC
  Administered 2022-09-10: 200 mg via ORAL
  Filled 2022-09-09: qty 2

## 2022-09-09 MED ORDER — OMEGA-3-ACID ETHYL ESTERS 1 G PO CAPS
2.0000 g | ORAL_CAPSULE | Freq: Two times a day (BID) | ORAL | Status: DC
  Administered 2022-09-09 – 2022-09-10 (×2): 2 g via ORAL
  Filled 2022-09-09 (×2): qty 2

## 2022-09-09 MED ORDER — MIDAZOLAM HCL 2 MG/2ML IJ SOLN
INTRAMUSCULAR | Status: DC | PRN
  Administered 2022-09-09: 2 mg via INTRAVENOUS

## 2022-09-09 MED ORDER — PROPOFOL 10 MG/ML IV BOLUS
INTRAVENOUS | Status: AC
  Filled 2022-09-09: qty 20

## 2022-09-09 MED ORDER — ONDANSETRON HCL 4 MG PO TABS: 4.0000 mg | ORAL_TABLET | Freq: Four times a day (QID) | ORAL | Status: DC | PRN

## 2022-09-09 MED ORDER — OXYCODONE HCL 5 MG/5ML PO SOLN: 5.0000 mg | Freq: Once | ORAL | Status: AC | PRN

## 2022-09-09 MED ORDER — CHLORHEXIDINE GLUCONATE CLOTH 2 % EX PADS: 6.0000 | MEDICATED_PAD | Freq: Once | CUTANEOUS | Status: DC

## 2022-09-09 MED ORDER — ADULT MULTIVITAMIN W/MINERALS CH
1.0000 | ORAL_TABLET | Freq: Every day | ORAL | Status: DC
  Administered 2022-09-10: 1 via ORAL
  Filled 2022-09-09: qty 1

## 2022-09-09 MED ORDER — OXYCODONE HCL 5 MG PO TABS
ORAL_TABLET | ORAL | Status: AC
  Filled 2022-09-09: qty 1

## 2022-09-09 MED ORDER — LACTATED RINGERS IV SOLN: INTRAVENOUS | Status: DC

## 2022-09-09 MED ORDER — PHENOL 1.4 % MT LIQD: 1.0000 | OROMUCOSAL | Status: DC | PRN

## 2022-09-09 MED ORDER — BISACODYL 10 MG RE SUPP: 10.0000 mg | Freq: Every day | RECTAL | Status: DC | PRN

## 2022-09-09 MED ORDER — AMISULPRIDE (ANTIEMETIC) 5 MG/2ML IV SOLN
10.0000 mg | Freq: Once | INTRAVENOUS | Status: AC | PRN
  Administered 2022-09-09: 10 mg via INTRAVENOUS

## 2022-09-09 MED ORDER — ACETAMINOPHEN 500 MG PO TABS
1000.0000 mg | ORAL_TABLET | Freq: Once | ORAL | Status: AC
  Administered 2022-09-09: 1000 mg via ORAL
  Filled 2022-09-09: qty 2

## 2022-09-09 MED ORDER — FLEET ENEMA 7-19 GM/118ML RE ENEM: 1.0000 | ENEMA | Freq: Once | RECTAL | Status: DC | PRN

## 2022-09-09 MED ORDER — MEPERIDINE HCL 25 MG/ML IJ SOLN: 6.2500 mg | INTRAMUSCULAR | Status: DC | PRN

## 2022-09-09 MED ORDER — GABAPENTIN 300 MG PO CAPS
300.0000 mg | ORAL_CAPSULE | Freq: Once | ORAL | Status: AC
  Administered 2022-09-09: 300 mg via ORAL
  Filled 2022-09-09: qty 1

## 2022-09-09 MED ORDER — SODIUM CHLORIDE 0.9% FLUSH: 3.0000 mL | INTRAVENOUS | Status: DC | PRN

## 2022-09-09 MED ORDER — DULOXETINE HCL 30 MG PO CPEP
60.0000 mg | ORAL_CAPSULE | Freq: Every day | ORAL | Status: DC
  Administered 2022-09-10: 60 mg via ORAL
  Filled 2022-09-09: qty 2

## 2022-09-09 MED ORDER — BUPIVACAINE HCL (PF) 0.25 % IJ SOLN
INTRAMUSCULAR | Status: AC
  Filled 2022-09-09: qty 30

## 2022-09-09 MED ORDER — DIAZEPAM 5 MG PO TABS
5.0000 mg | ORAL_TABLET | Freq: Four times a day (QID) | ORAL | Status: DC | PRN
  Administered 2022-09-09: 10 mg via ORAL
  Administered 2022-09-10 (×2): 5 mg via ORAL
  Filled 2022-09-09 (×2): qty 1
  Filled 2022-09-09: qty 2

## 2022-09-09 MED ORDER — HYDROMORPHONE HCL 1 MG/ML IJ SOLN
0.2500 mg | INTRAMUSCULAR | Status: DC | PRN
  Administered 2022-09-09 (×2): 0.5 mg via INTRAVENOUS
  Administered 2022-09-09: 0.25 mg via INTRAVENOUS

## 2022-09-09 MED ORDER — VITAMIN C 500 MG PO TABS
500.0000 mg | ORAL_TABLET | Freq: Every day | ORAL | Status: DC
  Administered 2022-09-10: 500 mg via ORAL
  Filled 2022-09-09: qty 1

## 2022-09-09 MED ORDER — FLUOXETINE HCL 10 MG PO CAPS
10.0000 mg | ORAL_CAPSULE | Freq: Every day | ORAL | Status: DC
  Administered 2022-09-10: 10 mg via ORAL
  Filled 2022-09-09: qty 1

## 2022-09-09 MED ORDER — GABAPENTIN 300 MG PO CAPS
300.0000 mg | ORAL_CAPSULE | Freq: Every day | ORAL | Status: DC
  Administered 2022-09-09: 300 mg via ORAL
  Filled 2022-09-09: qty 1

## 2022-09-09 MED ORDER — ONDANSETRON HCL 4 MG/2ML IJ SOLN: 4.0000 mg | Freq: Four times a day (QID) | INTRAMUSCULAR | Status: DC | PRN

## 2022-09-09 MED ORDER — CEFAZOLIN SODIUM-DEXTROSE 2-4 GM/100ML-% IV SOLN
2.0000 g | INTRAVENOUS | Status: AC
  Administered 2022-09-09: 2 g via INTRAVENOUS
  Filled 2022-09-09: qty 100

## 2022-09-09 SURGICAL SUPPLY — 53 items
ADH SKN CLS APL DERMABOND .7 (GAUZE/BANDAGES/DRESSINGS) ×2
APL SKNCLS STERI-STRIP NONHPOA (GAUZE/BANDAGES/DRESSINGS) ×1
BAG COUNTER SPONGE SURGICOUNT (BAG) ×1 IMPLANT
BAG SPNG CNTER NS LX DISP (BAG) ×2
BENZOIN TINCTURE PRP APPL 2/3 (GAUZE/BANDAGES/DRESSINGS) ×2 IMPLANT
BLADE CLIPPER SURG (BLADE) IMPLANT
BOLT PLATE XLIF 5.5X55 LRG (Bolt) IMPLANT
BONE MATRIX OSTEOCEL PRO LRG (Bone Implant) IMPLANT
BONE MATRIX OSTEOCEL PRO MED (Bone Implant) IMPLANT
CAGE MODULUS XLW 8X22X50 - 10 (Cage) IMPLANT
DERMABOND ADVANCED .7 DNX12 (GAUZE/BANDAGES/DRESSINGS) ×2 IMPLANT
DRAPE C-ARM 42X72 X-RAY (DRAPES) ×1 IMPLANT
DRAPE C-ARMOR (DRAPES) ×1 IMPLANT
DRAPE LAPAROTOMY 100X72X124 (DRAPES) ×1 IMPLANT
DRAPE SURG 17X23 STRL (DRAPES) ×2 IMPLANT
DRSG OPSITE POSTOP 3X4 (GAUZE/BANDAGES/DRESSINGS) IMPLANT
DRSG OPSITE POSTOP 4X6 (GAUZE/BANDAGES/DRESSINGS) IMPLANT
ELECT BLADE 6.5 EXT (BLADE) IMPLANT
ELECT REM PT RETURN 9FT ADLT (ELECTROSURGICAL) ×1
ELECTRODE REM PT RTRN 9FT ADLT (ELECTROSURGICAL) ×1 IMPLANT
GAUZE 4X4 16PLY ~~LOC~~+RFID DBL (SPONGE) IMPLANT
GLOVE ECLIPSE 9.0 STRL (GLOVE) ×1 IMPLANT
GLOVE EXAM NITRILE XL STR (GLOVE) IMPLANT
GOWN STRL REUS W/ TWL LRG LVL3 (GOWN DISPOSABLE) IMPLANT
GOWN STRL REUS W/ TWL XL LVL3 (GOWN DISPOSABLE) ×1 IMPLANT
GOWN STRL REUS W/TWL 2XL LVL3 (GOWN DISPOSABLE) IMPLANT
GOWN STRL REUS W/TWL LRG LVL3 (GOWN DISPOSABLE)
GOWN STRL REUS W/TWL XL LVL3 (GOWN DISPOSABLE) ×1
KIT BASIN OR (CUSTOM PROCEDURE TRAY) ×1 IMPLANT
KIT DILATOR XLIF 5 (KITS) IMPLANT
KIT MAXCESS (KITS) IMPLANT
KIT TURNOVER KIT B (KITS) ×1 IMPLANT
MODULE NVM5 NEXT GEN EMG (NEEDLE) IMPLANT
MODULUS XLW 10X22X55MM 10 (Spine Construct) IMPLANT
NDL HYPO 25X1 1.5 SAFETY (NEEDLE) ×1 IMPLANT
NEEDLE HYPO 25X1 1.5 SAFETY (NEEDLE) ×1 IMPLANT
NS IRRIG 1000ML POUR BTL (IV SOLUTION) ×1 IMPLANT
PACK LAMINECTOMY NEURO (CUSTOM PROCEDURE TRAY) ×1 IMPLANT
PLATE 2H 10MM (Plate) IMPLANT
PLATE 2H DECADE 8MM (Plate) IMPLANT
SCREW DECADE 5.5X50 (Screw) IMPLANT
SOL ELECTROSURG ANTI STICK (MISCELLANEOUS) ×1
SOLUTION ELECTROSURG ANTI STCK (MISCELLANEOUS) ×1 IMPLANT
SPONGE SURGIFOAM ABS GEL SZ50 (HEMOSTASIS) IMPLANT
SPONGE T-LAP 4X18 ~~LOC~~+RFID (SPONGE) IMPLANT
STRIP CLOSURE SKIN 1/2X4 (GAUZE/BANDAGES/DRESSINGS) ×1 IMPLANT
SUT VIC AB 2-0 CT1 18 (SUTURE) ×2 IMPLANT
SUT VIC AB 3-0 SH 8-18 (SUTURE) ×2 IMPLANT
TAPE CLOTH 3X10 TAN LF (GAUZE/BANDAGES/DRESSINGS) ×1 IMPLANT
TOWEL GREEN STERILE (TOWEL DISPOSABLE) ×1 IMPLANT
TOWEL GREEN STERILE FF (TOWEL DISPOSABLE) ×1 IMPLANT
TRAY FOLEY MTR SLVR 16FR STAT (SET/KITS/TRAYS/PACK) ×1 IMPLANT
WATER STERILE IRR 1000ML POUR (IV SOLUTION) ×1 IMPLANT

## 2022-09-09 NOTE — Progress Notes (Signed)
Orthopedic Tech Progress Note Patient Details:  OSBON SVAY 1960-10-04 [id number]  RN stated patient was good and has brace   Patient ID: Leland Her, male   DOB: 08/03/1960, 62 y.o.   MRN: [id number]  Janit Pagan 09/09/2022, 3:45 PM

## 2022-09-09 NOTE — Op Note (Signed)
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  Left L2-3 and left L3-4 lateral plate fixation  Surgeon:Rufino Staup A.Adain Geurin, M.D.  Asst. Surgeon: None  Anesthesia: General  Indication: 62 year old male status post extensive cervical thoracic and lumbar surgeries with chronic myelopathy and chronic worsening back pain.  Workup demonstrates evidence of progressive instability with disc base loss and foraminal stenosis above the level of his prior L4 to pelvic fusion.  Patient presents now for left-sided L2-3 and L2-3 anterior lateral decompression and fusion in hopes of improving his symptoms.  Operative note: After induction of anesthesia, patient positioned in the right lateral decubitus position and appropriately padded.  Left flank and left lumbar region prepped and draped sterilely.  The bed was flexed to allow better access to the retroperitoneal space on the left side.  A incision was made overlying the body of L3.  This carried down sharply to the lateral fascia.  A secondary incision was made in the left posterior flank.  Using blunt dissection through the left posterior flank incision access was made into the retroperitoneal space.  Peritoneal sac and contents were mobilized anteriorly.  Using a finger as a guide a dilator was then passed through the abdominal wall and docked into the lateral aspect the disc base at L3-4 through the psoas muscle.  This was confirmed to be in good position by fluoroscopy and intraoperative neuromonitoring confirmed safe passage with regard to the lumbar plexus.  The dilators were sequentially increased in size again using intraoperative fluoroscopy and neural  monitoring.  The self-retaining tractor was placed over the dilators.  This was gently expanded.  The disc base was examined.  Disc base was stimulated again no adjacent neural structures were found.  The shim was then docked into the disc base at L3-4.  The retractor was widened.  At this point there was a clear exiting lumbar nerve root inferiorly to the retractor that was visible but was safe from the operative field and not under any stretch or pressure.  Disc base was then incised.  Discectomy then performed with various instruments.  Contralateral release was performed.  Disc base was sequentially dilated and a 10 mm lordotic implant was found to be most appropriate.  A NuVasive 10 mm x 10 degree lordotic implant which was 22 mm deep and 55 mm wide was then packed with Osteocel plus.  This was then impacted in the place and confirmed to be in good position in both AP and lateral planes.  A lateral plate was then placed over the bodies of L3 and L4.  This had to be position somewhat anteriorly secondary to the adjacent lumbar plexus.  This was then docked into the vertebral bodies of L3 and L4 using an awl.  Bicortical screws were then placed through the body with good purchase.  Screws given a final tightening.  They are locked in place.  Locking heads were engaged.  The lateral plate was locked in place.  Final images revealed good position of the cage and the hardware at the proper operative level with normal alignment of the spine.  The retractor was removed.  Dissection was then redirected 1 level cephalad.  Once again dilators were used to expose the disc base in a similar fashion again using  fluoroscopy and neuromonitoring.  Self-retaining tractor was placed.  Disc base was safely entered.  Discectomy again performed.  Disc base is then prepared for interbody fusion.  The space distracted up to 8 mm.  An 8 mm x 22 mm x 50 mm implant was then packed with Osteocel plus and then impacted into place.  This  was in good position both AP and lateral planes.  A lateral plate was then placed over the bodies of L2 and L3.  This then attached under fluoroscopic guidance using bicortical screws.  Screws given final tightening and the heads were locked into place.  Plate was secured with a locking mechanism.  Retractor system was removed.  Final images revealed good position of the cages and the hardware at the proper operative level with normal alignment of the spine.  No evidence of complications.  Fascia was reapproximated using 2-0 Vicryl suture.  Skin was then reapproximated in layers using Vicryl sutures.  Steri-Strips and sterile dressing were applied.  No apparent complications.  Patient tolerated the procedure well and he returns to the recovery room postop.

## 2022-09-09 NOTE — Transfer of Care (Signed)
Immediate Anesthesia Transfer of Care Note  Patient: Chase Gonzalez  Procedure(s) Performed: LEFT LUMBAR TWO-THREE, LUMBAR THREE-FOUR ANTERIOR LUMBAR INTERBODY FUSION WITH LATERAL PLATE (Left: Spine Lumbar)  Patient Location: PACU  Anesthesia Type:General  Level of Consciousness: drowsy  Airway & Oxygen Therapy: Patient Spontanous Breathing and Patient connected to face mask oxygen  Post-op Assessment: Report given to RN and Post -op Vital signs reviewed and stable  Post vital signs: Reviewed and stable  Last Vitals:  Vitals Value Taken Time  BP 109/66 09/09/22 1102  Temp    Pulse 96 09/09/22 1105  Resp 23 09/09/22 1105  SpO2 93 % 09/09/22 1105  Vitals shown include unvalidated device data.  Last Pain:  Vitals:   09/09/22 0640  TempSrc:   PainSc: 0-No pain      Patients Stated Pain Goal: 0 ([id number])  Complications: No notable events documented.

## 2022-09-09 NOTE — Anesthesia Procedure Notes (Signed)
Procedure Name: Intubation Date/Time: 09/09/2022 8:14 AM  Performed by: Eligha Bridegroom, CRNAPre-anesthesia Checklist: Patient identified, Emergency Drugs available, Suction available, Patient being monitored and Timeout performed Patient Re-evaluated:Patient Re-evaluated prior to induction Oxygen Delivery Method: Circle system utilized Preoxygenation: Pre-oxygenation with 100% oxygen Induction Type: IV induction Ventilation: Mask ventilation without difficulty and Oral airway inserted - appropriate to patient size Laryngoscope Size: Glidescope Grade View: Grade I Tube type: Oral Tube size: 7.5 mm Number of attempts: 1 Airway Equipment and Method: Stylet and Video-laryngoscopy Placement Confirmation: ETT inserted through vocal cords under direct vision, positive ETCO2 and breath sounds checked- equal and bilateral Secured at: 22 cm Tube secured with: Tape Dental Injury: Teeth and Oropharynx as per pre-operative assessment

## 2022-09-09 NOTE — Brief Op Note (Signed)
09/09/2022  10:38 AM  PATIENT:  Chase Gonzalez  62 y.o. male  PRE-OPERATIVE DIAGNOSIS:  Spondylolisthesis  POST-OPERATIVE DIAGNOSIS:  Spondylolisthesis  PROCEDURE:  Procedure(s): LEFT LUMBAR TWO-THREE, LUMBAR THREE-FOUR ANTERIOR LUMBAR INTERBODY FUSION WITH LATERAL PLATE (Left)  SURGEON:  Surgeon(s) and Role:    * Earnie Larsson, MD - Primary  PHYSICIAN ASSISTANT:   ASSISTANTS: none   ANESTHESIA:   general  EBL:  75 mL   BLOOD ADMINISTERED:none  DRAINS: none   LOCAL MEDICATIONS USED:  MARCAINE     SPECIMEN:  No Specimen  DISPOSITION OF SPECIMEN:  N/A  COUNTS:  YES  TOURNIQUET:  * No tourniquets in log *  DICTATION: .Dragon Dictation  PLAN OF CARE: Admit to inpatient   PATIENT DISPOSITION:  PACU - hemodynamically stable.   Delay start of Pharmacological VTE agent (>24hrs) due to surgical blood loss or risk of bleeding: yes

## 2022-09-09 NOTE — Anesthesia Postprocedure Evaluation (Signed)
Anesthesia Post Note  Patient: Chase Gonzalez  Procedure(s) Performed: LEFT LUMBAR TWO-THREE, LUMBAR THREE-FOUR ANTERIOR LUMBAR INTERBODY FUSION WITH LATERAL PLATE (Left: Spine Lumbar)     Patient location during evaluation: PACU Anesthesia Type: General Level of consciousness: sedated and responds to stimulation Pain management: pain level controlled Vital Signs Assessment: post-procedure vital signs reviewed and stable Respiratory status: spontaneous breathing Cardiovascular status: stable Anesthetic complications: no Comments: Pt sleeping comfortably, but when stimulated awake he complains of severe pain.   No notable events documented.  Last Vitals:  Vitals:   09/09/22 1154 09/09/22 1230  BP:  118/81  Pulse: 98 (!) 105  Resp: 18 15  Temp:    SpO2: 94% 96%    Last Pain:  Vitals:   09/09/22 1154  TempSrc:   PainSc: 10-Worst pain ever                 Nolon Nations

## 2022-09-09 NOTE — H&P (Signed)
Chase Gonzalez is an 62 y.o. male.   Chief Complaint: Back pain HPI: 62 year old male with history of extensive cervical thoracic and lumbar surgery presents with worsening back pain which is mostly mechanical in nature.  Workup demonstrates evidence of extreme disc degeneration with disc base collapse and instability at L2-3 and L3-4.  Patient has failed all efforts of conservative management and presents now for left-sided L2-3 and L3-4 anterior lateral interbody decompression and fusion in hopes of improving his symptoms.  Past Medical History:  Diagnosis Date   Anxiety    Arthritis    Constipation due to opioid therapy    Depression    due to pain   History of kidney stones    history of previous kidney stones, several    Neuropathy     Past Surgical History:  Procedure Laterality Date   ANTERIOR CERVICAL DECOMP/DISCECTOMY FUSION  10/15/2017   Dr. Annette Stable   APPLICATION OF ROBOTIC ASSISTANCE FOR SPINAL PROCEDURE N/A 05/22/2020   Procedure: APPLICATION OF ROBOTIC ASSISTANCE FOR SPINAL PROCEDURE;  Surgeon: Earnie Larsson, MD;  Location: [place];  Service: Neurosurgery;  Laterality: N/A;   COLONOSCOPY     HARDWARE REMOVAL N/A 12/09/2019   Procedure: Removal of Lumbar Four, Lumbar Five and Sacral One pedicle screws;  Surgeon: Earnie Larsson, MD;  Location: [place];  Service: Neurosurgery;  Laterality: N/A;  Removal of Lumbar Four, Lumbar Five and Sacral One pedicle screws   HARDWARE REMOVAL Bilateral 03/03/2022   Procedure: Removal of bilateral Lumbar two pedicle screws;  Surgeon: Earnie Larsson, MD;  Location: [place];  Service: Neurosurgery;  Laterality: Bilateral;   JOINT REPLACEMENT Left    Knee   LAMINECTOMY WITH POSTERIOR LATERAL ARTHRODESIS LEVEL 4 N/A 05/15/2020   Procedure: CANCELLED PROCEDURE;  Surgeon: Earnie Larsson, MD;  Location: [place];  Service: Neurosurgery;  Laterality: N/A;   LAMINECTOMY WITH POSTERIOR LATERAL ARTHRODESIS LEVEL 4 N/A 05/22/2020   Procedure: Posterior lateral fusion -  Lumbar two-Sacral one with Sacral two AIar/iIliac screws - Maz[place];  Surgeon: Earnie Larsson, MD;  Location: [place];  Service: Neurosurgery;  Laterality: N/A;   posterior cervical disc fusion  02/26/2017   ROTATOR CUFF REPAIR Bilateral    THORACIC DISC SURGERY     2017, 2 ruptured discs, had emergency surgery    History reviewed. No pertinent family history. Social History:  reports that he has never smoked. His smokeless tobacco use includes chew. He reports that he does not currently use alcohol. He reports that he does not currently use drugs.  Allergies: No Known Allergies  Medications Prior to Admission  Medication Sig Dispense Refill   acetaminophen (TYLENOL) 500 MG tablet Take 1,000 mg by mouth every 8 (eight) hours as needed for moderate pain.      Ascorbic Acid (VITAMIN C PO) Take 1 tablet by mouth daily.     docusate sodium (COLACE) 100 MG capsule Take 200 mg by mouth daily.      DULoxetine (CYMBALTA) 60 MG capsule Take 60 mg by mouth daily.     FLUoxetine (PROZAC) 10 MG capsule Take 10 mg by mouth daily.     gabapentin (NEURONTIN) 300 MG capsule Take 300 mg by mouth at bedtime.     ibuprofen (ADVIL) 200 MG tablet Take 600 mg by mouth every 6 (six) hours as needed for headache or mild pain.     meloxicam (MOBIC) 15 MG tablet Take 15 mg by mouth daily as needed for pain.     Multiple Vitamins-Minerals (MULTIVITAMIN  WITH MINERALS) tablet Take 1 tablet by mouth daily. Mens     omega-3 acid ethyl esters (LOVAZA) 1 g capsule Take 2 g by mouth 2 (two) times daily.     Oxycodone HCl 10 MG TABS Take 1-2 tablets (10-20 mg total) by mouth See admin instructions. 20 mg at bedtime + 10 mg during the day if needed for pain. (Patient taking differently: Take 10 mg by mouth See admin instructions. 10 mg at bedtime + 10 mg during the day if needed for pain.) 60 tablet 0    No results found for this or any previous visit (from the past 48 hour(s)). No results found.  Pertinent items noted in HPI and  remainder of comprehensive ROS otherwise negative.  Blood pressure (!) 143/94, pulse 87, temperature 97.9 F (36.6 C), temperature source Oral, resp. rate 18, height 5\' 11"  (1.803 m), weight 94.3 kg, SpO2 100 %.  Patient is awake and alert.  He is oriented and appropriate.  Speech is fluent.  Judgment insight are intact.  Cranial nerve function normal bilateral.  Motor examination reveals generalized spasticity in both upper and lower extremities.  Patient with some mild grip and intrinsic weakness in both hands.  Gait is unsteady.  Posture is flexed.  Lower extremity strength is 4+/5 with increased tone.  Reflexes are increased.  Hoffmann signs are present.  Toes are upgoing to plantar stimulation.  Examination of head ears eyes nose and throat is unremarked.  Chest and abdomen benign.  Extremities are free from injury or deformity. 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  Plan left L2-3, L3-4 anterior lateral interbody decompression and fusion utilizing interbody cages, morselized allograft, and lateral plate fixation.  Risks and benefits been explained.  Patient wishes to proceed.  Cooper Render Prentis Langdon 09/09/2022, 7:48 AM

## 2022-09-10 ENCOUNTER — Encounter (HOSPITAL_COMMUNITY): Payer: Self-pay | Admitting: Neurosurgery

## 2022-09-10 MED ORDER — METHOCARBAMOL 750 MG PO TABS: 750.0000 mg | ORAL_TABLET | Freq: Four times a day (QID) | ORAL | 0 refills | Status: AC | PRN

## 2022-09-10 MED FILL — Thrombin For Soln 5000 Unit: CUTANEOUS | Qty: 2 | Status: AC

## 2022-09-10 NOTE — Evaluation (Signed)
Occupational Therapy Evaluation Patient Details Name: Chase Gonzalez MRN: [id number] DOB: 01-Aug-1960 Today's Date: 09/10/2022   History of Present Illness Chase Gonzalez is a 62 yo male who underwent  left-sided L2-3 and L2-3 anterior lateral decompression and fusion 3/26. PMHx: neuropathy, osteoarthritis, multiple spinal surgeries (8?), anxiety, neuropathy   Clinical Impression   Chase Gonzalez was evaluated s/p the above spine surgery. He is mod I with use of DME at baseline. Upon evaluation he was limited by back pain, abnormal posture, RLE deficits, back precautions, generalized weakness and decreased activity tolerance. Overall he requires up to min A for LB ADS (pt has AE at home to increased indep), and up to min G for mobility with RW. Provided cues and education on spinal precautions and compensatory techniques throughout, handout provided and pt demonstrated good recall. Pt does not require further acute OT services. Pt will benefit from continued OT in pt's natural environment at discharge.        Recommendations for follow up therapy are one component of a multi-disciplinary discharge planning process, led by the attending physician.  Recommendations may be updated based on patient status, additional functional criteria and insurance authorization.   Assistance Recommended at Discharge Frequent or constant Supervision/Assistance  Patient can return home with the following A little help with walking and/or transfers;A little help with bathing/dressing/bathroom;Assistance with cooking/housework;Direct supervision/assist for medications management;Direct supervision/assist for financial management;Assist for transportation;Help with stairs or ramp for entrance    Functional Status Assessment  Patient has had a recent decline in their functional status and demonstrates the ability to make significant improvements in function in a reasonable and predictable amount of time.  Equipment Recommendations  Other  (comment)       Precautions / Restrictions Precautions Precautions: Fall;Back Precaution Booklet Issued: Yes (comment) Required Braces or Orthoses: Spinal Brace Spinal Brace: Lumbar corset Restrictions Weight Bearing Restrictions: No      Mobility Bed Mobility Overal bed mobility: Needs Assistance Bed Mobility: Rolling, Sidelying to Sit Rolling: Supervision Sidelying to sit: Supervision       General bed mobility comments: good demo of log roll    Transfers Overall transfer level: Needs assistance Equipment used: Rolling walker (2 wheels) Transfers: Sit to/from Stand Sit to Stand: Supervision                  Balance Overall balance assessment: Needs assistance Sitting-balance support: Feet supported Sitting balance-Leahy Scale: Good     Standing balance support: Single extremity supported, During functional activity Standing balance-Leahy Scale: Fair                             ADL either performed or assessed with clinical judgement   ADL Overall ADL's : Needs assistance/impaired Eating/Feeding: Independent   Grooming: Supervision/safety;Standing   Upper Body Bathing: Set up;Sitting   Lower Body Bathing: Minimal assistance;Sit to/from stand   Upper Body Dressing : Set up;Sitting   Lower Body Dressing: Minimal assistance;Sit to/from stand   Toilet Transfer: Supervision/safety;Ambulation;Rolling walker (2 wheels)   Toileting- Clothing Manipulation and Hygiene: Supervision/safety       Functional mobility during ADLs: Supervision/safety;Rolling walker (2 wheels) General ADL Comments: pt has good knowledge of back precautions     Vision Baseline Vision/History: 0 No visual deficits Vision Assessment?: No apparent visual deficits     Perception Perception Perception Tested?: No   Praxis Praxis Praxis tested?: Not tested    Pertinent Vitals/Pain Pain Assessment Pain Assessment: Faces Faces  Pain Scale: Hurts a little  bit Pain Location: back Pain Descriptors / Indicators: Discomfort, Guarding, Grimacing Pain Intervention(s): Limited activity within patient's tolerance, Monitored during session     Hand Dominance Right   Extremity/Trunk Assessment Upper Extremity Assessment Upper Extremity Assessment: Overall WFL for tasks assessed   Lower Extremity Assessment Lower Extremity Assessment: Defer to PT evaluation   Cervical / Trunk Assessment Cervical / Trunk Assessment: Back Surgery   Communication Communication Communication: No difficulties   Cognition Arousal/Alertness: Awake/alert Behavior During Therapy: WFL for tasks assessed/performed Overall Cognitive Status: Within Functional Limits for tasks assessed             General Comments  VSS on RA, reports improved pain and posture            Home Living Family/patient expects to be discharged to:: Private residence Living Arrangements: Spouse/significant other;Children Available Help at Discharge: Family;Available 24 hours/day Type of Home: House Home Access: Stairs to enter [employer] of Steps: 1   Home Layout: One level     Bathroom Shower/Tub: Occupational psychologist: Handicapped height     Home Equipment: Conservation officer, nature (2 wheels);Rollator (4 wheels);Cane - single point;BSC/3in1;Shower seat;Grab bars - tub/shower;Adaptive equipment Adaptive Equipment: Reacher;Sock aid;Long-handled sponge (dressing stick)        Prior Functioning/Environment Prior Level of Function : Independent/Modified Independent;Working/employed;Driving             Mobility Comments: RW in the home, SPC at work, denies falls ADLs Comments: drives works        OT Problem List: Decreased range of motion;Decreased strength;Decreased activity tolerance;Decreased knowledge of precautions;Pain         OT Goals(Current goals can be found in the care plan section) Acute Rehab OT Goals Patient Stated Goal: home OT Goal  Formulation: With patient Time For Goal Achievement: 09/10/22 Potential to Achieve Goals: Good   AM-PAC OT "6 Clicks" Daily Activity     Outcome Measure Help from another person eating meals?: None Help from another person taking care of personal grooming?: A Little Help from another person toileting, which includes using toliet, bedpan, or urinal?: A Little Help from another person bathing (including washing, rinsing, drying)?: A Little Help from another person to put on and taking off regular upper body clothing?: None Help from another person to put on and taking off regular lower body clothing?: A Little 6 Click Score: 20   End of Session Equipment Utilized During Treatment: Rolling walker (2 wheels);Back brace Nurse Communication: Mobility status  Activity Tolerance: Patient tolerated treatment well Patient left: in chair;with call bell/phone within reach  OT Visit Diagnosis: Unsteadiness on feet (R26.81);Other abnormalities of gait and mobility (R26.89);Muscle weakness (generalized) (M62.81);Pain                Time: [id number] OT Time Calculation (min): 19 min Charges:  OT General Charges $OT Visit: 1 Visit OT Evaluation $OT Eval Low Complexity: [place], OTR/L [place] [facility] [place] Communication Preferred   Elliot Cousin 09/10/2022, 9:47 AM

## 2022-09-10 NOTE — TOC Transition Note (Signed)
Transition of Care [facility]) - CM/SW Discharge Note   Patient Details  Name: RYMAN STFLEUR MRN: [id number] Date of Birth: February 26, 1961  Transition of Care [facility]) CM/SW Contact:  Pollie Friar, RN Phone Number: 09/10/2022, 10:47 AM   Clinical Narrative:    Pt is discharging home with home health services through [facility]. Information on the AVS.  Pt has transportation home.  Any needed DME will be obtained by bedside RN.   Final next level of care: Home w Home Health Services Barriers to Discharge: No Barriers Identified   Patient Goals and CMS Choice CMS Medicare.gov Compare Post Acute Care list provided to:: Patient Choice offered to / list presented to : Patient  Discharge Placement                         Discharge Plan and Services Additional resources added to the After Visit Summary for                            [facility] Arranged: PT, OT HH Agency:  ([place]) Date [place]: 09/10/22   Representative spoke with at [place]: [place] ([place]) Interventions SDOH Screenings   Tobacco Use: High Risk (09/10/2022)     Readmission Risk Interventions     No data to display

## 2022-09-10 NOTE — Progress Notes (Signed)
Patient alert and oriented, voiding adequately, skin clean, dry and intact without evidence of skin break down, or symptoms of complications - no redness or edema noted, only slight tenderness at site.  Patient states pain is manageable at time of discharge. Patient has an appointment with MD in 2 weeks 

## 2022-09-10 NOTE — Discharge Instructions (Addendum)
Wound Care Keep incision covered and dry for three days.  If you shower, cover incision with plastic wrap.  Do not put any creams, lotions, or ointments on incision. Leave steri-strips on back.  They will fall off by themselves. Activity Walk each and every day, increasing distance each day. No lifting greater than 8 lbs.  Avoid excessive back bending. No driving for 2 weeks; may ride as a passenger locally. If provided with back brace, wear when out of bed.  It is not necessary to wear brace in bed. Diet Resume your normal diet.  Return to Work Will be discussed at you follow up appointment. Call Your Doctor If Any of These Occur Redness, drainage, or swelling at the wound.  Temperature greater than 101 degrees. Severe pain not relieved by pain medication. Incision starts to come apart. Follow Up Appt Call today for appointment in 1-2 weeks [phone]) or for problems.  If you have any hardware placed in your spine, you will need an x-ray before your appointment.

## 2022-09-10 NOTE — Discharge Summary (Signed)
Physician Discharge Summary  Patient ID: Chase SOBOTTA MRN: [id number] DOB/AGE: [birth year] 62 y.o.  Admit date: 09/09/2022 Discharge date: 09/10/2022  Admission Diagnoses:  Discharge Diagnoses:  Principal Problem:   Lumbar foraminal stenosis  Discharged Condition: good  Hospital Course: Patient admitted to hospital where he underwent uncomplicated [id number] and [id number] anterior lateral interbody fusion with lateral plate fixation.  Postoperatively doing well.  Standing ambulating and voiding without difficulty.  Standing straighter.  Back pain improved.  No wound issues.  Ready for discharge home.  Consults:   Significant Diagnostic Studies:   Treatments:   Discharge Exam: Blood pressure 107/65, pulse 89, temperature 98.7 F (37.1 C), temperature source Oral, resp. rate 18, height 5\' 11"  (1.803 m), weight 94.3 kg, SpO2 96 %. Awake and alert.  Oriented and appropriate.  Motor and sensory function stable.  Wound clean and dry.  Chest and abdomen benign.  Disposition: Discharge disposition: 01-Home or Self Care       Discharge Instructions     Face-to-face encounter (required for Medicare/Medicaid patients)   Complete by: As directed    I Charlie Pitter certify that this patient is under my care and that I, or a nurse practitioner or physician's assistant working with me, had a face-to-face encounter that meets the physician face-to-face encounter requirements with th is patient on 09/10/2022. The encounter with the patient was in whole, or in part for the following medical condition(s) which is the primary reason for home health care (List medical condition): Lumbar foraminal stenosis   The encounter with the patient was in whole, or in part, for the following medical condition, which is the primary reason for home health care: Lumbar foraminal stenosis   I certify that, based on my findings, the following services are medically necessary home health services: Physical therapy   Reason  for Medically Necessary Home Health Services: Therapy- Home Adaptation to Facilitate Safety   My clinical findings support the need for the above services: Unsafe ambulation due to balance issues   Further, I certify that my clinical findings support that this patient is homebound due to: Pain interferes with ambulation/mobility   Home Health   Complete by: As directed    To provide the following care/treatments:  PT OT        Allergies as of 09/10/2022   No Known Allergies      Medication List     TAKE these medications    acetaminophen 500 MG tablet Commonly known as: TYLENOL Take 1,000 mg by mouth every 8 (eight) hours as needed for moderate pain.   docusate sodium 100 MG capsule Commonly known as: COLACE Take 200 mg by mouth daily.   DULoxetine 60 MG capsule Commonly known as: CYMBALTA Take 60 mg by mouth daily.   FLUoxetine 10 MG capsule Commonly known as: PROZAC Take 10 mg by mouth daily.   gabapentin 300 MG capsule Commonly known as: NEURONTIN Take 300 mg by mouth at bedtime.   ibuprofen 200 MG tablet Commonly known as: ADVIL Take 600 mg by mouth every 6 (six) hours as needed for headache or mild pain.   meloxicam 15 MG tablet Commonly known as: MOBIC Take 15 mg by mouth daily as needed for pain.   methocarbamol 750 MG tablet Commonly known as: Robaxin-750 Take 1 tablet (750 mg total) by mouth every 6 (six) hours as needed for muscle spasms.   multivitamin with minerals tablet Take 1 tablet by mouth daily. Mens   omega-3 acid ethyl esters  1 g capsule Commonly known as: LOVAZA Take 2 g by mouth 2 (two) times daily.   Oxycodone HCl 10 MG Tabs Take 1-2 tablets (10-20 mg total) by mouth See admin instructions. 20 mg at bedtime + 10 mg during the day if needed for pain. What changed:  how much to take additional instructions   VITAMIN C PO Take 1 tablet by mouth daily.               Durable Medical Equipment  (From admission, onward)            Start     Ordered   09/09/22 1321  DME Walker rolling  Once       Question:  Patient needs a walker to treat with the following condition  Answer:  Degenerative spondylolisthesis   09/09/22 1320   09/09/22 1321  DME 3 n 1  Once        09/09/22 1320             Signed: Cooper Render Kampbell Holaway 09/10/2022, 9:50 AM

## 2022-09-10 NOTE — Evaluation (Signed)
Physical Therapy Evaluation  Patient Details Name: Chase Gonzalez MRN: [id number] DOB: 01-30-1961 Today's Date: 09/10/2022  History of Present Illness  Chase Gonzalez is a 62 yo male who underwent  left-sided L2-3 and L2-3 anterior lateral decompression and fusion 3/26. PMHx: neuropathy, osteoarthritis, multiple spinal surgeries (8?), anxiety, neuropathy   Clinical Impression  Pt admitted with above diagnosis. At the time of PT eval, pt was able to demonstrate transfers and ambulation with gross min guard assist to supervision for safety and RW for support. Pt was educated on precautions, brace application/wearing schedule, appropriate activity progression, and car transfer. Pt currently with functional limitations due to the deficits listed below (see PT Problem List). Pt will benefit from skilled PT to increase their independence and safety with mobility to allow discharge to the venue listed below.         Recommendations for follow up therapy are one component of a multi-disciplinary discharge planning process, led by the attending physician.  Recommendations may be updated based on patient status, additional functional criteria and insurance authorization.  Follow Up Recommendations       Assistance Recommended at Discharge PRN  Patient can return home with the following  A little help with walking and/or transfers;A little help with bathing/dressing/bathroom;Assistance with cooking/housework;Assist for transportation;Help with stairs or ramp for entrance    Equipment Recommendations None recommended by PT  Recommendations for Other Services       Functional Status Assessment Patient has had a recent decline in their functional status and demonstrates the ability to make significant improvements in function in a reasonable and predictable amount of time.     Precautions / Restrictions Precautions Precautions: Fall;Back Precaution Booklet Issued: Yes (comment) Required Braces or Orthoses:  Spinal Brace Spinal Brace: Lumbar corset Restrictions Weight Bearing Restrictions: No      Mobility  Bed Mobility               General bed mobility comments: Pt was received sitting up in the recliner.    Transfers Overall transfer level: Needs assistance Equipment used: Rolling walker (2 wheels) Transfers: Sit to/from Stand Sit to Stand: Supervision           General transfer comment: VC's for hand placement on seated surface for safety. Increased time to prepare for power up to full stand but no assist required. Close supervision provided for safety.    Ambulation/Gait Ambulation/Gait assistance: Min guard Gait Distance (Feet): 350 Feet Assistive device: Rolling walker (2 wheels) Gait Pattern/deviations: Step-through pattern, Decreased stride length, Decreased dorsiflexion - right, Decreased dorsiflexion - left, Knee flexed in stance - right, Drifts right/left, Trunk flexed Gait velocity: Decreased Gait velocity interpretation: <1.31 ft/sec, indicative of household ambulator   General Gait Details: VC's for improved posture and closer walker proximity. Pt with difficulty achieving full hip extension in standing.  Stairs            Wheelchair Mobility    Modified Rankin (Stroke Patients Only)       Balance Overall balance assessment: Needs assistance Sitting-balance support: Feet supported Sitting balance-Leahy Scale: Good     Standing balance support: Single extremity supported, During functional activity Standing balance-Leahy Scale: Fair                               Pertinent Vitals/Pain Pain Assessment Pain Assessment: Faces Faces Pain Scale: Hurts a little bit Pain Location: back Pain Descriptors / Indicators: Discomfort, Guarding, Grimacing Pain  Intervention(s): Limited activity within patient's tolerance, Monitored during session, Repositioned    Home Living Family/patient expects to be discharged to:: Private  residence Living Arrangements: Spouse/significant other;Children Available Help at Discharge: Family;Available 24 hours/day Type of Home: House Home Access: Stairs to enter   [employer] of Steps: 1   Home Layout: One level Home Equipment: Conservation officer, nature (2 wheels);Rollator (4 wheels);Cane - single point;BSC/3in1;Shower seat;Grab bars - tub/shower;Adaptive equipment      Prior Function Prior Level of Function : Independent/Modified Independent;Working/employed;Driving             Mobility Comments: RW in the home, SPC at work, denies falls ADLs Comments: drives works     Journalist, newspaper   Dominant Hand: Right    Extremity/Trunk Assessment   Upper Extremity Assessment Upper Extremity Assessment: Overall WFL for tasks assessed    Lower Extremity Assessment Lower Extremity Assessment: RLE deficits/detail;LLE deficits/detail RLE Deficits / Details: Decreased DF and hip strength during gait cycle resulting in low floor clearance and sliding ball of foot to advance. Strong MMT when isolating tib ant (5/5) LLE Deficits / Details: Decreased strength and muscular endurance. Low floor clearance during gait cycle.    Cervical / Trunk Assessment Cervical / Trunk Assessment: Back Surgery  Communication   Communication: No difficulties  Cognition Arousal/Alertness: Awake/alert Behavior During Therapy: WFL for tasks assessed/performed Overall Cognitive Status: Within Functional Limits for tasks assessed                                          General Comments General comments (skin integrity, edema, etc.): VSS on RA, reports improved pain and posture    Exercises     Assessment/Plan    PT Assessment Patient needs continued PT services  PT Problem List Decreased strength;Decreased activity tolerance;Decreased balance;Decreased mobility;Decreased knowledge of use of DME;Decreased safety awareness;Decreased knowledge of precautions;Pain       PT  Treatment Interventions DME instruction;Gait training;Stair training;Functional mobility training;Therapeutic activities;Balance training;Therapeutic exercise;Patient/family education    PT Goals (Current goals can be found in the Care Plan section)  Acute Rehab PT Goals Patient Stated Goal: Home at d/c PT Goal Formulation: With patient Time For Goal Achievement: 09/17/22 Potential to Achieve Goals: Good    Frequency Min 5X/week     Co-evaluation               AM-PAC PT "6 Clicks" Mobility  Outcome Measure Help needed turning from your back to your side while in a flat bed without using bedrails?: A Little Help needed moving from lying on your back to sitting on the side of a flat bed without using bedrails?: A Little Help needed moving to and from a bed to a chair (including a wheelchair)?: A Little Help needed standing up from a chair using your arms (e.g., wheelchair or bedside chair)?: A Little Help needed to walk in hospital room?: A Little Help needed climbing 3-5 steps with a railing? : A Little 6 Click Score: 18    End of Session Equipment Utilized During Treatment: Gait belt Activity Tolerance: Patient tolerated treatment well Patient left: in chair;with call bell/phone within reach Nurse Communication: Mobility status PT Visit Diagnosis: Unsteadiness on feet (R26.81);Pain Pain - part of body:  (back)    Time: [id number] PT Time Calculation (min) (ACUTE ONLY): 18 min   Charges:   PT Evaluation $PT Eval Low Complexity: 1 Low  Rolinda Roan, PT, DPT Acute Rehabilitation Services Secure Chat Preferred Office: [phone]   Thelma Comp 09/10/2022, 11:08 AM

## 2023-02-24 ENCOUNTER — Encounter: Payer: Self-pay | Admitting: Physical Medicine & Rehabilitation

## 2023-04-08 ENCOUNTER — Ambulatory Visit: Payer: BC Managed Care – PPO | Admitting: Physical Medicine & Rehabilitation

## 2023-04-29 ENCOUNTER — Encounter
Payer: Managed Care, Other (non HMO) | Attending: Physical Medicine & Rehabilitation | Admitting: Physical Medicine & Rehabilitation

## 2023-04-29 ENCOUNTER — Encounter: Payer: Self-pay | Admitting: Physical Medicine & Rehabilitation

## 2023-04-29 VITALS — BP 127/83 | HR 88 | Ht 71.0 in | Wt 217.0 lb

## 2023-04-29 DIAGNOSIS — R252 Cramp and spasm: Secondary | ICD-10-CM | POA: Diagnosis present

## 2023-04-29 DIAGNOSIS — M4714 Other spondylosis with myelopathy, thoracic region: Secondary | ICD-10-CM | POA: Diagnosis present

## 2023-04-29 DIAGNOSIS — M792 Neuralgia and neuritis, unspecified: Secondary | ICD-10-CM | POA: Diagnosis present

## 2023-04-29 MED ORDER — BACLOFEN 10 MG PO TABS: 10.0000 mg | ORAL_TABLET | Freq: Three times a day (TID) | ORAL | 0 refills | Status: DC

## 2023-04-29 MED ORDER — GABAPENTIN 300 MG PO CAPS: 300.0000 mg | ORAL_CAPSULE | Freq: Two times a day (BID) | ORAL | 4 refills | Status: DC

## 2023-04-29 MED ORDER — BACLOFEN 10 MG PO TABS: 10.0000 mg | ORAL_TABLET | Freq: Three times a day (TID) | ORAL | 4 refills | Status: DC

## 2023-04-29 NOTE — Patient Instructions (Signed)
ALWAYS FEEL FREE TO CALL OUR OFFICE WITH ANY PROBLEMS OR QUESTIONS [phone])  **PLEASE NOTE** ALL MEDICATION REFILL REQUESTS (INCLUDING CONTROLLED SUBSTANCES) NEED TO BE MADE AT LEAST 7 DAYS PRIOR TO REFILL BEING DUE. ANY REFILL REQUESTS INSIDE THAT TIME FRAME MAY RESULT IN DELAYS IN RECEIVING YOUR PRESCRIPTION.                   BACLOFEN 10MG  TABLET  5MG  AM, 5MG  AFTERNOON, 5MG  BEDTIME FOR 4 DAYS THEN 10-18-08 FOR 4 DAYS THEN 03-20-09 FOR 4 DAYS, THEN 10MG  THREE X DAILY   GABAPENTIN 300MG   IN TWO WEEKS INCREASE TO TWICE DAILY.

## 2023-04-29 NOTE — Progress Notes (Signed)
Subjective:    Patient ID: Chase Gonzalez, male    DOB: Sep 16, 1960, 62 y.o.   MRN: [id number]  HPI  This is an initial office note for Chase Gonzalez who is here for evaluation of spasticity.  In reviewing his chart Chase Gonzalez has a long history of multiple back surgeries with numerous lumbar decompressions and fusions the last of which was a L2-4 decompression and fusion.  He also has a hx oft cervical discectomy and fusion the last of which was in 2019.  He was referred here by Dr. Lelon Perla.  He has been dealing with spasms for 15 or more years, even before he had any surgery (per pt). 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 He had surgery to decompress and stabilize the area by Dr. Alvester Morin.  Currently he's having spasms from his low back to both legs R>L. They can come on at any time but are worst at night. He sleeps in a recliner to help mitigate symptoms. Lying flat worsens sx. He may only get 3 hours of sleep at a time.  He also has continued numbness and dysesthesias in both legs, again R>L. Legs can be sensitive even to touch.   For mobility he uses a walker at home. He'll take his cane when he leaves the home. He fell out of the shower after the last surgery, perhaps 3 months ago.  In reviewing his medication profile it does not appear that he is on any particular medication for spasticity at present othe than gabapentin  which he takes at night (300mg ). He took baclofen previously but only at night. He says it didn't help. He also has been on robaxin previously.   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 He tries to stay as active as he can. He can ride his mower.   From a bowel bladder standpoint he's also having issues. He has a weekly bowel movement, taking 2 colace each morning. Urination can be difficult as well with hesitancy an issue at times. Urgency can be a problem as well, ?d/t overflow. He  urinates 4 x daily.     Pain Inventory Average Pain 6 Pain Right Now 6 My pain is intermittent, constant, sharp, stabbing, and tingling  In the last 24 hours, has pain interfered with the following? General activity 6 Relation with others 9 Enjoyment of life 10 What TIME of day is your pain at its worst? evening Sleep (in general) Poor  Pain is worse with: walking, bending, and inactivity Pain improves with: rest, heat/ice, therapy/exercise, and medication Relief from Meds: 8  use a cane how many minutes can you walk? 5 minutes ability to climb steps?  yes do you drive?  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  yes  weakness numbness tingling trouble walking spasms depression anxiety suicidal thoughts  Any changes since last visit?  no  Any changes since last visit?  no    No family history on file. Social History   Socioeconomic History   Marital status: Married    Spouse name: Not on file   Number of children: Not on file   Years of education: Not on file   Highest education level: Not on file  Occupational History   Not on  file  Tobacco Use   Smoking status: Never   Smokeless tobacco: Current    Types: Chew   Tobacco comments:    One can per week  Vaping Use   Vaping status: Never Used  Substance and Sexual Activity   Alcohol use: Not Currently    Comment: occasionally   Drug use: Not Currently   Sexual activity: Yes  Other Topics Concern   Not on file  Social History Narrative   Not on file   Social Determinants of Health   Financial Resource Strain: Not on file  Food Insecurity: Not on file  Transportation Needs: Not on file  Physical Activity: Not on file  Stress: Not on file  Social Connections: Unknown (10/25/2021)   Received from [facility]   Social Network    Social Network: Not on file    Past Surgical History:  Procedure Laterality Date   ANTERIOR CERVICAL DECOMP/DISCECTOMY FUSION  10/15/2017   Dr. Jordan Likes   ANTERIOR LAT LUMBAR FUSION Left 09/09/2022   Procedure: LEFT LUMBAR TWO-THREE, LUMBAR THREE-FOUR ANTERIOR LUMBAR INTERBODY FUSION WITH LATERAL PLATE;  Surgeon: Julio Sicks, MD;  Location: [facility] OR;  Service: Neurosurgery;  Laterality: Left;   APPLICATION OF ROBOTIC ASSISTANCE FOR SPINAL PROCEDURE N/A 05/22/2020   Procedure: APPLICATION OF ROBOTIC ASSISTANCE FOR SPINAL PROCEDURE;  Surgeon: Julio Sicks, MD;  Location: [facility] OR;  Service: Neurosurgery;  Laterality: N/A;   COLONOSCOPY     HARDWARE REMOVAL N/A 12/09/2019   Procedure: Removal of Lumbar Four, Lumbar Five and Sacral One pedicle screws;  Surgeon: Julio Sicks, MD;  Location: [facility] OR;  Service: Neurosurgery;  Laterality: N/A;  Removal of Lumbar Four, Lumbar Five and Sacral One pedicle screws   HARDWARE REMOVAL Bilateral 03/03/2022   Procedure: Removal of bilateral Lumbar two pedicle screws;  Surgeon: Julio Sicks, MD;  Location: [facility] OR;  Service: Neurosurgery;  Laterality: Bilateral;   JOINT REPLACEMENT Left    Knee   LAMINECTOMY WITH POSTERIOR LATERAL ARTHRODESIS LEVEL 4 N/A 05/15/2020   Procedure: CANCELLED PROCEDURE;  Surgeon: Julio Sicks, MD;  Location: [facility] OR;  Service: Neurosurgery;  Laterality: N/A;   LAMINECTOMY WITH POSTERIOR LATERAL ARTHRODESIS LEVEL 4 N/A 05/22/2020   Procedure: Posterior lateral fusion - Lumbar two-Sacral one with Sacral two AIar/iIliac screws - Mazor;  Surgeon: Julio Sicks, MD;  Location: [facility] OR;  Service: Neurosurgery;  Laterality: N/A;   posterior cervical disc fusion  02/26/2017   ROTATOR CUFF REPAIR Bilateral    THORACIC DISC SURGERY     2017, 2 ruptured discs, had emergency surgery   Past Medical History:  Diagnosis Date   Anxiety    Arthritis    Constipation due to opioid therapy    Depression    due to pain   History of kidney stones    history of previous kidney stones, several     Neuropathy    BP 127/83   Pulse 88   Ht 5\' 11"  (1.803 m)   Wt 217 lb (98.4 kg)   SpO2 95%   BMI 30.27 kg/m   Opioid Risk Score:   Fall Risk Score:  `1  Depression screen [facility] 2/9     04/29/2023    9:25 AM  Depression screen PHQ 2/9  Decreased Interest 2  Down, Depressed, Hopeless 1  PHQ - 2 Score 3  Altered sleeping 3  Tired, decreased energy 3  Change in appetite 3  Feeling bad or failure about yourself  2  Trouble concentrating 2  Moving  slowly or fidgety/restless 1  Suicidal thoughts 0  PHQ-9 Score 17    Review of Systems  Constitutional:        Weight gain  Cardiovascular:  Positive for leg swelling.  Gastrointestinal:  Positive for constipation.  Genitourinary:        Urine retention   Musculoskeletal:  Positive for back pain and gait problem.       Spasms, pain in both legs from hips down feet.  Neurological:  Positive for tremors, weakness and numbness.       Tingling  Hematological:  Bruises/bleeds easily.  Psychiatric/Behavioral:  Positive for suicidal ideas.        Depression, anxiety  All other systems reviewed and are negative.     Objective:   Physical Exam  General: Alert and oriented x 3, No apparent distress HEENT: Head is normocephalic, atraumatic, PERRLA, EOMI, sclera anicteric, oral mucosa pink and moist, dentition intact, ext ear canals clear,  Neck: Supple without JVD or lymphadenopathy Heart: Reg rate and rhythm. No murmurs rubs or gallops Chest: CTA bilaterally without wheezes, rales, or rhonchi; no distress Abdomen: Soft, non-tender, non-distended, bowel sounds positive. Extremities: No clubbing, cyanosis, or edema. Pulses are 2+ Psych: Pt's affect is appropriate. Pt is cooperative Skin: Clean and intact without signs of breakdown Neuro:  Alert and oriented x 3. Normal insight and awareness. Intact Memory. Normal language and speech. Cranial nerve exam unremarkable. MMT: Upper extremity motor function is 5 out of 5.  Right lower  extremity he is 4 - hip flexion 4 knee extension 4+ ankle dorsiflexion plantarflexion.  Left lower extremity is 4 to 4+ out of 5 throughout.  He has a T10 sensory level.  His sensory exam is more diminished in the right lower extremity than the left.  He is hyperreflexive in the right leg at 3+ and he is at least 2+ in the left leg.  Patient needs extra time to stand due to weakness in his legs.  He struggles with balance especially on the right leg and he tends to hyperextend the right knee in stance..   Musculoskeletal: Patient with generalized pain his lumbar spine.  He is notable scars from his numerous surgeries his neck mid back and low back.  He is notable scar tissue as well as atrophy of his paraspinal musculature.  He struggles to stand and full extension due to discomfort and back weakness.       Assessment & Plan:  1 Hx of multiple spinal surgeries over the last 8-10 years. 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  His main issues currently are are spasticity in his lower extremities as well as dysesthesias.  -Will retrial baclofen starting at 5 mg 3 times daily and titrating up to 10 mg 3 times daily  -Will increase gabapentin after 2 weeks to 300 mg twice daily  -Continue to stretch and move as tolerated  -Not appropriate for a baclofen pump at this point  2.  Neurogenic bowel-he is only emptying his bowels once a week  -Suggested stopping Colace and trying Senokot S2 tablets at bedtime  3.  Neurogenic bladder: Will see how improving his bowel movements assist in his bladder emptying   Forty-five minutes of face to face patient care time were spent during this visit. All questions were encouraged and  answered. Follow up with me in 2 months.

## 2023-05-08 ENCOUNTER — Other Ambulatory Visit: Payer: Self-pay | Admitting: Neurosurgery

## 2023-05-08 DIAGNOSIS — M4317 Spondylolisthesis, lumbosacral region: Secondary | ICD-10-CM

## 2023-05-11 ENCOUNTER — Encounter: Payer: Self-pay | Admitting: Neurosurgery

## 2023-05-18 ENCOUNTER — Ambulatory Visit
Admission: RE | Admit: 2023-05-18 | Discharge: 2023-05-18 | Disposition: A | Discharge status: Home / Self Care | Payer: Managed Care, Other (non HMO) | Source: Ambulatory Visit | Attending: Neurosurgery | Admitting: Neurosurgery

## 2023-05-18 DIAGNOSIS — M4317 Spondylolisthesis, lumbosacral region: Secondary | ICD-10-CM

## 2023-07-09 ENCOUNTER — Telehealth: Payer: Self-pay

## 2023-07-09 DIAGNOSIS — M4714 Other spondylosis with myelopathy, thoracic region: Secondary | ICD-10-CM

## 2023-07-09 DIAGNOSIS — R252 Cramp and spasm: Secondary | ICD-10-CM

## 2023-07-09 NOTE — Telephone Encounter (Signed)
Patient requesting refill on baclofen 10mg , patient said he will run out today and his next appt is 07/15/23

## 2023-07-10 MED ORDER — BACLOFEN 10 MG PO TABS: 10.0000 mg | ORAL_TABLET | Freq: Three times a day (TID) | ORAL | 4 refills | Status: DC

## 2023-07-10 NOTE — Telephone Encounter (Signed)
There were 4 RF's on the November Rx. I went ahead and refilled anyway

## 2023-07-15 ENCOUNTER — Encounter
Payer: Managed Care, Other (non HMO) | Attending: Physical Medicine & Rehabilitation | Admitting: Physical Medicine & Rehabilitation

## 2023-07-15 DIAGNOSIS — R252 Cramp and spasm: Secondary | ICD-10-CM | POA: Insufficient documentation

## 2023-07-15 DIAGNOSIS — M792 Neuralgia and neuritis, unspecified: Secondary | ICD-10-CM | POA: Insufficient documentation

## 2023-07-15 DIAGNOSIS — M4714 Other spondylosis with myelopathy, thoracic region: Secondary | ICD-10-CM | POA: Insufficient documentation

## 2023-09-09 ENCOUNTER — Encounter: Payer: Self-pay | Admitting: Physical Medicine & Rehabilitation

## 2023-09-09 ENCOUNTER — Encounter
Payer: Managed Care, Other (non HMO) | Attending: Physical Medicine & Rehabilitation | Admitting: Physical Medicine & Rehabilitation

## 2023-09-09 VITALS — BP 135/86 | HR 88 | Wt 220.0 lb

## 2023-09-09 DIAGNOSIS — R252 Cramp and spasm: Secondary | ICD-10-CM | POA: Insufficient documentation

## 2023-09-09 DIAGNOSIS — M792 Neuralgia and neuritis, unspecified: Secondary | ICD-10-CM | POA: Insufficient documentation

## 2023-09-09 MED ORDER — GABAPENTIN 600 MG PO TABS: 600.0000 mg | ORAL_TABLET | Freq: Three times a day (TID) | ORAL | 3 refills | Status: DC

## 2023-09-09 NOTE — Patient Instructions (Signed)
 ALWAYS FEEL FREE TO CALL OUR OFFICE WITH ANY PROBLEMS OR QUESTIONS [phone])  **PLEASE NOTE** ALL MEDICATION REFILL REQUESTS (INCLUDING CONTROLLED SUBSTANCES) NEED TO BE MADE AT LEAST 7 DAYS PRIOR TO REFILL BEING DUE. ANY REFILL REQUESTS INSIDE THAT TIME FRAME MAY RESULT IN DELAYS IN RECEIVING YOUR PRESCRIPTION.    GABAPENTIN: WEEK ONE 300-300-300MG  WEEK TWO: 300-300-600MG  WEEK THREE: 600-300-600MG  WEEK 4+: 600MG  THREE X DAILY

## 2023-09-09 NOTE — Progress Notes (Signed)
 Subjective:    Patient ID: Chase Gonzalez, male    DOB: 1961/05/27, 63 y.o.   MRN: [id number]  HPI  Mr. Suit is here in follow up his spasms. He has had good results with baclofen. He had lumbar RF's 3 weeks ago which didn't help at all. He follows up with the surgeon today regarding the next steps. .   He's up to 10mg  three x daily on baclofen which has really helped spasms. He's not too fatigued with it either. He sleeps ok in the recliner. Gabapentin has helped somewhat with his nerve pain but he is still having significant dysesthesias in legs R>L. He is tolerating gabapentin with out issue.  From a bowel standpoint he's moving his bowels regularly with senna-s 2 tabs at bedtime. Bladder has emptied better with his bowels empty.   Pain Inventory Average Pain 6 Pain Right Now 5 My pain is intermittent, constant, sharp, burning, dull, stabbing, tingling, and aching  In the last 24 hours, has pain interfered with the following? General activity 8 Relation with others 5 Enjoyment of life 10 What TIME of day is your pain at its worst? evening, night, and varies Sleep (in general) Fair  Pain is worse with: walking, bending, sitting, standing, and some activites Pain improves with: medication and heat Relief from Meds: 5  History reviewed. No pertinent family history. Social History   Socioeconomic History   Marital status: Married    Spouse name: Not on file   Number of children: Not on file   Years of education: Not on file   Highest education level: Not on file  Occupational History   Not on file  Tobacco Use   Smoking status: Never   Smokeless tobacco: Current    Types: Chew   Tobacco comments:    One can per week  Vaping Use   Vaping status: Never Used  Substance and Sexual Activity   Alcohol use: Not Currently    Comment: occasionally   Drug use: Not Currently   Sexual activity: Yes  Other Topics Concern   Not on file  Social History Narrative   Not on  file   Social Drivers of Health   Financial Resource Strain: Not on file  Food Insecurity: Not on file  Transportation Needs: Not on file  Physical Activity: Not on file  Stress: Not on file  Social Connections: Unknown (10/25/2021)   Received from [facility]   Social Network    Social Network: Not on file   Past Surgical History:  Procedure Laterality Date   ANTERIOR CERVICAL DECOMP/DISCECTOMY FUSION  10/15/2017   Dr. Jordan Likes   ANTERIOR LAT LUMBAR FUSION Left 09/09/2022   Procedure: LEFT LUMBAR TWO-THREE, LUMBAR THREE-FOUR ANTERIOR LUMBAR INTERBODY FUSION WITH LATERAL PLATE;  Surgeon: Julio Sicks, MD;  Location: [facility] OR;  Service: Neurosurgery;  Laterality: Left;   APPLICATION OF ROBOTIC ASSISTANCE FOR SPINAL PROCEDURE N/A 05/22/2020   Procedure: APPLICATION OF ROBOTIC ASSISTANCE FOR SPINAL PROCEDURE;  Surgeon: Julio Sicks, MD;  Location: [facility] OR;  Service: Neurosurgery;  Laterality: N/A;   COLONOSCOPY     HARDWARE REMOVAL N/A 12/09/2019   Procedure: Removal of Lumbar Four, Lumbar Five and Sacral One pedicle screws;  Surgeon: Julio Sicks, MD;  Location: [facility] OR;  Service: Neurosurgery;  Laterality: N/A;  Removal of Lumbar Four, Lumbar Five and Sacral One pedicle screws   HARDWARE REMOVAL Bilateral 03/03/2022   Procedure: Removal of bilateral Lumbar two pedicle screws;  Surgeon: Julio Sicks,  MD;  Location: [facility] OR;  Service: Neurosurgery;  Laterality: Bilateral;   JOINT REPLACEMENT Left    Knee   LAMINECTOMY WITH POSTERIOR LATERAL ARTHRODESIS LEVEL 4 N/A 05/15/2020   Procedure: CANCELLED PROCEDURE;  Surgeon: Julio Sicks, MD;  Location: [facility] OR;  Service: Neurosurgery;  Laterality: N/A;   LAMINECTOMY WITH POSTERIOR LATERAL ARTHRODESIS LEVEL 4 N/A 05/22/2020   Procedure: Posterior lateral fusion - Lumbar two-Sacral one with Sacral two AIar/iIliac screws - Mazor;  Surgeon: Julio Sicks, MD;  Location: [facility] OR;  Service: Neurosurgery;  Laterality: N/A;   posterior cervical disc fusion   02/26/2017   ROTATOR CUFF REPAIR Bilateral    THORACIC DISC SURGERY     2017, 2 ruptured discs, had emergency surgery   Past Surgical History:  Procedure Laterality Date   ANTERIOR CERVICAL DECOMP/DISCECTOMY FUSION  10/15/2017   Dr. Jordan Likes   ANTERIOR LAT LUMBAR FUSION Left 09/09/2022   Procedure: LEFT LUMBAR TWO-THREE, LUMBAR THREE-FOUR ANTERIOR LUMBAR INTERBODY FUSION WITH LATERAL PLATE;  Surgeon: Julio Sicks, MD;  Location: [facility] OR;  Service: Neurosurgery;  Laterality: Left;   APPLICATION OF ROBOTIC ASSISTANCE FOR SPINAL PROCEDURE N/A 05/22/2020   Procedure: APPLICATION OF ROBOTIC ASSISTANCE FOR SPINAL PROCEDURE;  Surgeon: Julio Sicks, MD;  Location: [facility] OR;  Service: Neurosurgery;  Laterality: N/A;   COLONOSCOPY     HARDWARE REMOVAL N/A 12/09/2019   Procedure: Removal of Lumbar Four, Lumbar Five and Sacral One pedicle screws;  Surgeon: Julio Sicks, MD;  Location: [facility] OR;  Service: Neurosurgery;  Laterality: N/A;  Removal of Lumbar Four, Lumbar Five and Sacral One pedicle screws   HARDWARE REMOVAL Bilateral 03/03/2022   Procedure: Removal of bilateral Lumbar two pedicle screws;  Surgeon: Julio Sicks, MD;  Location: [facility] OR;  Service: Neurosurgery;  Laterality: Bilateral;   JOINT REPLACEMENT Left    Knee   LAMINECTOMY WITH POSTERIOR LATERAL ARTHRODESIS LEVEL 4 N/A 05/15/2020   Procedure: CANCELLED PROCEDURE;  Surgeon: Julio Sicks, MD;  Location: [facility] OR;  Service: Neurosurgery;  Laterality: N/A;   LAMINECTOMY WITH POSTERIOR LATERAL ARTHRODESIS LEVEL 4 N/A 05/22/2020   Procedure: Posterior lateral fusion - Lumbar two-Sacral one with Sacral two AIar/iIliac screws - Mazor;  Surgeon: Julio Sicks, MD;  Location: [facility] OR;  Service: Neurosurgery;  Laterality: N/A;   posterior cervical disc fusion  02/26/2017   ROTATOR CUFF REPAIR Bilateral    THORACIC DISC SURGERY     2017, 2 ruptured discs, had emergency surgery   Past Medical History:  Diagnosis Date   Anxiety    Arthritis    Constipation due to  opioid therapy    Depression    due to pain   History of kidney stones    history of previous kidney stones, several    Neuropathy    Wt 220 lb (99.8 kg)   BMI 30.68 kg/m   Opioid Risk Score:   Fall Risk Score:  `1  Depression screen [facility] 2/9     04/29/2023    9:25 AM  Depression screen PHQ 2/9  Decreased Interest 2  Down, Depressed, Hopeless 1  PHQ - 2 Score 3  Altered sleeping 3  Tired, decreased energy 3  Change in appetite 3  Feeling bad or failure about yourself  2  Trouble concentrating 2  Moving slowly or fidgety/restless 1  Suicidal thoughts 0  PHQ-9 Score 17    Review of Systems  Musculoskeletal:  Positive for back pain and gait problem.       Pain right knee,  both feet  All other systems reviewed and are negative.      Objective:   Physical Exam General: No acute distress HEENT: NCAT, EOMI, oral membranes moist Cards: reg rate  Chest: normal effort Abdomen: Soft, NT, ND Skin: dry, intact Extremities: no edema Psych: pleasant and appropriate  Skin: Clean and intact without signs of breakdown Neuro:  Alert and oriented x 3. Normal insight and awareness. Intact Memory. Normal language and speech. Cranial nerve exam unremarkable. MMT: Upper extremity motor function is 5 out of 5.  Right lower extremity he is 4 - hip flexion 4 knee extension 4+ ankle dorsiflexion plantarflexion.  Left lower extremity is 4 to 4+ out of 5 throughout.  He has a T10 sensory level.  His sensory exam is more diminished in the right lower extremity than the left.  He remains hyperreflexive in the right leg at 3+ and he is at least 2+ in the left leg.   .   Musculoskeletal:  limited lumbar ROM.  He is notable scars from his numerous surgeries his neck mid back and low back.    He still struggles to stand and full extension due to discomfort and back weakness.           Assessment & Plan:  1 Hx of multiple spinal surgeries over the last 8-10 years. 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  His main issues currently are are spasticity in his lower extremities as well as dysesthesias.             -continue Baclofen    10 mg 3 times daily             -Will titrate gabapentin ultimately up to 600mg  TID             -Continue to stretch and move as tolerated             -f/u with surgeron later today. 2.  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 Can titrate upward as needed either 3 at bedtime or 2 tabs twice daily.   3.  Neurogenic bladder: Will see how improving his bowel movements assist in his bladder emptying     30 minutes of face to face patient care time were spent during this visit. All questions were encouraged and answered. Follow up with me in 2 months.

## 2023-09-10 ENCOUNTER — Other Ambulatory Visit: Payer: Self-pay | Admitting: Neurosurgery

## 2023-09-10 DIAGNOSIS — M4804 Spinal stenosis, thoracic region: Secondary | ICD-10-CM

## 2023-09-16 ENCOUNTER — Encounter: Payer: Self-pay | Admitting: Neurosurgery

## 2023-09-17 ENCOUNTER — Ambulatory Visit
Admission: RE | Admit: 2023-09-17 | Discharge: 2023-09-17 | Disposition: A | Discharge status: Home / Self Care | Source: Ambulatory Visit | Attending: Neurosurgery | Admitting: Neurosurgery

## 2023-09-17 DIAGNOSIS — M4804 Spinal stenosis, thoracic region: Secondary | ICD-10-CM

## 2023-11-22 ENCOUNTER — Other Ambulatory Visit: Payer: Self-pay | Admitting: Physical Medicine & Rehabilitation

## 2023-11-22 DIAGNOSIS — R252 Cramp and spasm: Secondary | ICD-10-CM

## 2023-11-22 DIAGNOSIS — M4714 Other spondylosis with myelopathy, thoracic region: Secondary | ICD-10-CM

## 2023-12-27 ENCOUNTER — Other Ambulatory Visit: Payer: Self-pay | Admitting: Physical Medicine & Rehabilitation

## 2024-01-06 ENCOUNTER — Encounter: Attending: Physical Medicine & Rehabilitation | Admitting: Physical Medicine & Rehabilitation

## 2024-01-06 ENCOUNTER — Encounter: Payer: Self-pay | Admitting: Physical Medicine & Rehabilitation

## 2024-01-06 VITALS — BP 129/86 | HR 99 | Ht 71.0 in | Wt 216.0 lb

## 2024-01-06 DIAGNOSIS — M4714 Other spondylosis with myelopathy, thoracic region: Secondary | ICD-10-CM | POA: Insufficient documentation

## 2024-01-06 DIAGNOSIS — R252 Cramp and spasm: Secondary | ICD-10-CM | POA: Diagnosis present

## 2024-01-06 DIAGNOSIS — M792 Neuralgia and neuritis, unspecified: Secondary | ICD-10-CM | POA: Insufficient documentation

## 2024-01-06 MED ORDER — GABAPENTIN 600 MG PO TABS: 600.0000 mg | ORAL_TABLET | Freq: Three times a day (TID) | ORAL | 3 refills | Status: DC

## 2024-01-06 MED ORDER — BACLOFEN 20 MG PO TABS: 20.0000 mg | ORAL_TABLET | Freq: Three times a day (TID) | ORAL | 3 refills | Status: DC

## 2024-01-06 NOTE — Patient Instructions (Addendum)
 ALWAYS FEEL FREE TO CALL OUR OFFICE WITH ANY PROBLEMS OR QUESTIONS [phone])  **PLEASE NOTE** ALL MEDICATION REFILL REQUESTS (INCLUDING CONTROLLED SUBSTANCES) NEED TO BE MADE AT LEAST 7 DAYS PRIOR TO REFILL BEING DUE. ANY REFILL REQUESTS INSIDE THAT TIME FRAME MAY RESULT IN DELAYS IN RECEIVING YOUR PRESCRIPTION.    BACLOFEN : WEEK 1: 10-10-20MG  WEEK 2: 20-10-20MG  WEEK 3: 20MG  THREE X DAILY.   IF YOU WANT TO ADD SOMETHING ELSE FOR NERVE PAIN, GIVE ME A CALL IN A FEW WEEKS.

## 2024-01-06 NOTE — Progress Notes (Signed)
 Subjective:    Patient ID: Chase Gonzalez, male    DOB: [birth year], 63 y.o.   MRN: [id number]  HPI  Chase Gonzalez is here in follow up of his thoracic myelopathy. From a standpoint of his back he's about the same. He is hoping to start therapy soon and may go for back injections as well, insurance pending.   His spasms are controlled with baclofen  and gabapentin  as prescribed although he is still tight.. The gabapentin  has helped with his leg pain to a certain extent. He will have spasms when he is up too long or fatigued. Sometimes certain positions trigger spasms.   He is on oxycodone  per surgery. He uses mobic  as well.   Bowels are moving once or twie per week. He uses 3 senokot-s per day. Bladder seems to be emptying regularly.       Pain Inventory Average Pain 6 Pain Right Now 7 My pain is burning, stabbing, and aching  In the last 24 hours, has pain interfered with the following? General activity 9 Relation with others 9 Enjoyment of life 10 What TIME of day is your pain at its worst? evening Sleep (in general) Fair  Pain is worse with: walking and bending Pain improves with: medication Relief from Meds: 10  No family history on file. Social History   Socioeconomic History   Marital status: Married    Spouse name: Not on file   Number of children: Not on file   Years of education: Not on file   Highest education level: Not on file  Occupational History   Not on file  Tobacco Use   Smoking status: Never   Smokeless tobacco: Current    Types: Chew   Tobacco comments:    One can per week  Vaping Use   Vaping status: Never Used  Substance and Sexual Activity   Alcohol use: Not Currently    Comment: occasionally   Drug use: Not Currently   Sexual activity: Yes  Other Topics Concern   Not on file  Social History Narrative   Not on file   Social Drivers of Health   Financial Resource Strain: Not on file  Food Insecurity: Not on file  Transportation  Needs: Not on file  Physical Activity: Not on file  Stress: Not on file  Social Connections: Unknown (10/25/2021)   Received from [facility]   Social Network    Social Network: Not on file   Past Surgical History:  Procedure Laterality Date   ANTERIOR CERVICAL DECOMP/DISCECTOMY FUSION  10/15/2017   Dr. Louis   ANTERIOR LAT LUMBAR FUSION Left 09/09/2022   Procedure: LEFT LUMBAR TWO-THREE, LUMBAR THREE-FOUR ANTERIOR LUMBAR INTERBODY FUSION WITH LATERAL PLATE;  Surgeon: Louis Shove, MD;  Location: [facility] OR;  Service: Neurosurgery;  Laterality: Left;   APPLICATION OF ROBOTIC ASSISTANCE FOR SPINAL PROCEDURE N/A 05/22/2020   Procedure: APPLICATION OF ROBOTIC ASSISTANCE FOR SPINAL PROCEDURE;  Surgeon: Louis Shove, MD;  Location: [facility] OR;  Service: Neurosurgery;  Laterality: N/A;   COLONOSCOPY     HARDWARE REMOVAL N/A 12/09/2019   Procedure: Removal of Lumbar Four, Lumbar Five and Sacral One pedicle screws;  Surgeon: Louis Shove, MD;  Location: [facility] OR;  Service: Neurosurgery;  Laterality: N/A;  Removal of Lumbar Four, Lumbar Five and Sacral One pedicle screws   HARDWARE REMOVAL Bilateral 03/03/2022   Procedure: Removal of bilateral Lumbar two pedicle screws;  Surgeon: Louis Shove, MD;  Location: [facility] OR;  Service: Neurosurgery;  Laterality: Bilateral;  JOINT REPLACEMENT Left    Knee   LAMINECTOMY WITH POSTERIOR LATERAL ARTHRODESIS LEVEL 4 N/A 05/15/2020   Procedure: CANCELLED PROCEDURE;  Surgeon: Louis Shove, MD;  Location: [facility] OR;  Service: Neurosurgery;  Laterality: N/A;   LAMINECTOMY WITH POSTERIOR LATERAL ARTHRODESIS LEVEL 4 N/A 05/22/2020   Procedure: Posterior lateral fusion - Lumbar two-Sacral one with Sacral two AIar/iIliac screws - Mazor;  Surgeon: Louis Shove, MD;  Location: [facility] OR;  Service: Neurosurgery;  Laterality: N/A;   posterior cervical disc fusion  02/26/2017   ROTATOR CUFF REPAIR Bilateral    THORACIC DISC SURGERY     2017, 2 ruptured discs, had emergency surgery   Past Surgical  History:  Procedure Laterality Date   ANTERIOR CERVICAL DECOMP/DISCECTOMY FUSION  10/15/2017   Dr. Louis   ANTERIOR LAT LUMBAR FUSION Left 09/09/2022   Procedure: LEFT LUMBAR TWO-THREE, LUMBAR THREE-FOUR ANTERIOR LUMBAR INTERBODY FUSION WITH LATERAL PLATE;  Surgeon: Louis Shove, MD;  Location: [facility] OR;  Service: Neurosurgery;  Laterality: Left;   APPLICATION OF ROBOTIC ASSISTANCE FOR SPINAL PROCEDURE N/A 05/22/2020   Procedure: APPLICATION OF ROBOTIC ASSISTANCE FOR SPINAL PROCEDURE;  Surgeon: Louis Shove, MD;  Location: [facility] OR;  Service: Neurosurgery;  Laterality: N/A;   COLONOSCOPY     HARDWARE REMOVAL N/A 12/09/2019   Procedure: Removal of Lumbar Four, Lumbar Five and Sacral One pedicle screws;  Surgeon: Louis Shove, MD;  Location: [facility] OR;  Service: Neurosurgery;  Laterality: N/A;  Removal of Lumbar Four, Lumbar Five and Sacral One pedicle screws   HARDWARE REMOVAL Bilateral 03/03/2022   Procedure: Removal of bilateral Lumbar two pedicle screws;  Surgeon: Louis Shove, MD;  Location: [facility] OR;  Service: Neurosurgery;  Laterality: Bilateral;   JOINT REPLACEMENT Left    Knee   LAMINECTOMY WITH POSTERIOR LATERAL ARTHRODESIS LEVEL 4 N/A 05/15/2020   Procedure: CANCELLED PROCEDURE;  Surgeon: Louis Shove, MD;  Location: [facility] OR;  Service: Neurosurgery;  Laterality: N/A;   LAMINECTOMY WITH POSTERIOR LATERAL ARTHRODESIS LEVEL 4 N/A 05/22/2020   Procedure: Posterior lateral fusion - Lumbar two-Sacral one with Sacral two AIar/iIliac screws - Mazor;  Surgeon: Louis Shove, MD;  Location: [facility] OR;  Service: Neurosurgery;  Laterality: N/A;   posterior cervical disc fusion  02/26/2017   ROTATOR CUFF REPAIR Bilateral    THORACIC DISC SURGERY     2017, 2 ruptured discs, had emergency surgery   Past Medical History:  Diagnosis Date   Anxiety    Arthritis    Constipation due to opioid therapy    Depression    due to pain   History of kidney stones    history of previous kidney stones, several    Neuropathy    BP  129/86   Pulse 99   Ht 5' 11 (1.803 m)   Wt 216 lb (98 kg)   SpO2 93%   BMI 30.13 kg/m   Opioid Risk Score:   Fall Risk Score:  `1  Depression screen [facility] 2/9     01/06/2024    1:18 PM 09/09/2023   12:42 PM 04/29/2023    9:25 AM  Depression screen PHQ 2/9  Decreased Interest 3 0 2  Down, Depressed, Hopeless 3 0 1  PHQ - 2 Score 6 0 3  Altered sleeping   3  Tired, decreased energy   3  Change in appetite   3  Feeling bad or failure about yourself    2  Trouble concentrating   2  Moving slowly or fidgety/restless   1  Suicidal thoughts   0  PHQ-9 Score   17    Review of Systems  Musculoskeletal:  Positive for back pain and gait problem.  All other systems reviewed and are negative.      Objective:   Physical Exam General: No acute distress HEENT: NCAT, EOMI, oral membranes moist Cards: reg rate  Chest: normal effort Abdomen: Soft, NT, ND Skin: dry, intact Extremities: no edema Psych: pleasant and appropriate t Skin: Clean and intact without signs of breakdown Neuro:  Alert and oriented x 3. Normal insight and awareness. Intact Memory. Normal language and speech. Cranial nerve exam unremarkable. MMT: Upper extremity motor function is 5 out of 5.  Right lower extremity he is 4 - hip flexion 4 knee extension 4+ ankle dorsiflexion plantarflexion.  Left lower extremity is 4 to 4+ out of 5 throughout.  He still has a T10 sensory level.  His sensory exam is more diminished in the right lower extremity than the left.  He remains hyperreflexive in the right leg at 3+ and he is at least 2+ in the left leg once again. Lower ext tone 2/4.   SABRA   Musculoskeletal:  limited lumbar ROM.  He is notable scars from his numerous surgeries his neck mid back and low back.    He still struggles to stand and full extension due to discomfort and back weakness.           Assessment & Plan:  1 Hx of multiple spinal surgeries over the last 8-10 years. 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  His main issues currently are are spasticity in his lower extremities as well as dysesthesias.             -titrate Baclofen   to 20 mg 3 times daily after slow titration             -continue gabapentin   600mg  TID             -Continue to stretch and move as tolerated             -f/u with surgeron re: injectons and therapy 2.  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  Neurogenic bladder: bladder empties better when his bowels move.      20+ minutes of face to face patient care time were spent during this visit. All questions were encouraged and answered. Follow up with me in 4 months.

## 2024-02-22 ENCOUNTER — Encounter: Payer: Self-pay | Admitting: Physical Therapy

## 2024-02-22 ENCOUNTER — Ambulatory Visit: Attending: Student | Admitting: Physical Therapy

## 2024-02-22 ENCOUNTER — Other Ambulatory Visit: Payer: Self-pay

## 2024-02-22 DIAGNOSIS — M4714 Other spondylosis with myelopathy, thoracic region: Secondary | ICD-10-CM | POA: Diagnosis present

## 2024-02-22 DIAGNOSIS — M6281 Muscle weakness (generalized): Secondary | ICD-10-CM | POA: Diagnosis present

## 2024-02-22 DIAGNOSIS — M62830 Muscle spasm of back: Secondary | ICD-10-CM | POA: Insufficient documentation

## 2024-02-22 DIAGNOSIS — M5459 Other low back pain: Secondary | ICD-10-CM | POA: Diagnosis present

## 2024-02-22 NOTE — Therapy (Signed)
 OUTPATIENT PHYSICAL THERAPY THORACOLUMBAR EVALUATION   Patient Name: KEYVIN RISON MRN: [id number] DOB:1961-03-06, 63 y.o., male Today's Date: 02/22/2024  END OF SESSION:  PT End of Session - 02/22/24 0927     Visit Number 1    Date for PT Re-Evaluation 05/23/24    Authorization Type [employer]    PT Start Time 0929    PT Stop Time 1014    PT Time Calculation (min) 45 min    Activity Tolerance Patient tolerated treatment well    Behavior During Therapy [facility] for tasks assessed/performed          Past Medical History:  Diagnosis Date   Anxiety    Arthritis    Constipation due to opioid therapy    Depression    due to pain   History of kidney stones    history of previous kidney stones, several    Neuropathy    Past Surgical History:  Procedure Laterality Date   ANTERIOR CERVICAL DECOMP/DISCECTOMY FUSION  10/15/2017   Dr. Louis   ANTERIOR LAT LUMBAR FUSION Left 09/09/2022   Procedure: LEFT LUMBAR TWO-THREE, LUMBAR THREE-FOUR ANTERIOR LUMBAR INTERBODY FUSION WITH LATERAL PLATE;  Surgeon: Louis Shove, MD;  Location: [facility] OR;  Service: Neurosurgery;  Laterality: Left;   APPLICATION OF ROBOTIC ASSISTANCE FOR SPINAL PROCEDURE N/A 05/22/2020   Procedure: APPLICATION OF ROBOTIC ASSISTANCE FOR SPINAL PROCEDURE;  Surgeon: Louis Shove, MD;  Location: [facility] OR;  Service: Neurosurgery;  Laterality: N/A;   COLONOSCOPY     HARDWARE REMOVAL N/A 12/09/2019   Procedure: Removal of Lumbar Four, Lumbar Five and Sacral One pedicle screws;  Surgeon: Louis Shove, MD;  Location: [facility] OR;  Service: Neurosurgery;  Laterality: N/A;  Removal of Lumbar Four, Lumbar Five and Sacral One pedicle screws   HARDWARE REMOVAL Bilateral 03/03/2022   Procedure: Removal of bilateral Lumbar two pedicle screws;  Surgeon: Louis Shove, MD;  Location: [facility] OR;  Service: Neurosurgery;  Laterality: Bilateral;   JOINT REPLACEMENT Left    Knee   LAMINECTOMY WITH POSTERIOR LATERAL ARTHRODESIS LEVEL 4 N/A 05/15/2020   Procedure:  CANCELLED PROCEDURE;  Surgeon: Louis Shove, MD;  Location: [facility] OR;  Service: Neurosurgery;  Laterality: N/A;   LAMINECTOMY WITH POSTERIOR LATERAL ARTHRODESIS LEVEL 4 N/A 05/22/2020   Procedure: Posterior lateral fusion - Lumbar two-Sacral one with Sacral two AIar/iIliac screws - Mazor;  Surgeon: Louis Shove, MD;  Location: [facility] OR;  Service: Neurosurgery;  Laterality: N/A;   posterior cervical disc fusion  02/26/2017   ROTATOR CUFF REPAIR Bilateral    THORACIC DISC SURGERY     2017, 2 ruptured discs, had emergency surgery   Patient Active Problem List   Diagnosis Date Noted   Thoracic myelopathy 04/29/2023   Nerve pain 04/29/2023   Spasticity 04/29/2023   Lumbar foraminal stenosis 09/09/2022   Lumbar pseudoarthrosis 05/22/2020   Painful orthopaedic hardware (HCC) 12/09/2019   Postoperative pain 01/09/2019   Post-operative pain 01/08/2019   Hospice care patient 12/29/2018   Palliative care patient 12/29/2018   Degenerative spondylolisthesis 12/28/2018   Trochanteric bursitis, left hip 01/15/2017    PCP: KYM Lee, MD  REFERRING PROVIDER: Stacy, NP  REFERRING DIAG: Thoracic radiculopathy  Rationale for Evaluation and Treatment: Rehabilitation  THERAPY DIAG:  Thoracic myelopathy  Other low back pain  Muscle spasm of back  Muscle weakness (generalized)  ONSET DATE: 01/07/24  SUBJECTIVE:  SUBJECTIVE STATEMENT: Patient reports that about a year ago he had a lumbar fusion, but put stress on the thoracic spine.  He has had tremendous issues with the back.  He reports that he feels the biggest issue is pain and spasms in the right low back and buttock  PERTINENT HISTORY:  ACDF, lumbar fusion, thoracic laminectomy  PAIN:  Are you having pain? Yes: NPRS scale: 3/10 Pain location: right buttock and  low back Pain description: spasm, tightness, sharp Aggravating factors: bending leg up into car, trying to cross right leg over pain up to 10/10, reports cannot walk 100 yards due to pain Relieving factors: tried head, tried ice, rest and being still, uses a walker in the house, pain meds pain a 3/10 at best  PRECAUTIONS: Back  RED FLAGS: None   WEIGHT BEARING RESTRICTIONS: No  FALLS:  Has patient fallen in last 6 months? No  LIVING ENVIRONMENT: Lives with: lives with their family Lives in: House/apartment Stairs: No Has following equipment at home: Single point cane, Environmental consultant - 4 wheeled, Tour manager, and Grab bars  OCCUPATION: desk job  PLOF: Independent with community mobility with device  PATIENT GOALS: have less pain  NEXT MD VISIT: end of October  OBJECTIVE:  Note: Objective measures were completed at Evaluation unless otherwise noted.  DIAGNOSTIC FINDINGS:   IMPRESSION: 1. Interval postoperative changes of L1-L3 XLIF and lateral spinal fusion with removal of pedicle screws at L2. No evidence of hardware complication. 2. Unchanged postoperative appearance of prior L4-S1 interbody and L3 to pelvis posterior spinal fusion. 3. Interval progression of degenerative endplate changes at T12-L1, with disc bulge and left-greater-than-right facet arthropathy resulting in moderate spinal canal stenosis and moderate left neural foraminal narrowing.  IMPRESSION: 1. Prior laminectomy at T10. Faint signal change of the cord at this level, likely on the basis of myelomalacia as result of prior stenosis. 2. Spondylotic changes in the thoracic spine with moderate canal stenosis at C7-T1, T11-12, and T12-L1 secondary to disc bulging and facet arthropathy. Mild canal stenoses at other levels. 3. Moderate to severe foraminal stenoses bilaterally at T1-T2, T2-T3, T3-T4, T10-11, T11-12, and T12-L1. Mild-to-moderate foraminal stenoses at other levels.  COGNITION: Overall  cognitive status: Within functional limits for tasks assessed     SENSATION: WFL  MUSCLE LENGTH: POSTURE: rounded shoulders, forward head, decreased lumbar lordosis, and weight shift right  PALPATION: Patient is extremely tight in the mms of the low back, mid back, LE's and the upper traps, does have some diastisis rect  LUMBAR ROM:   AROM eval  Flexion Decreased 50%  Extension Unable to get to neutral is in about 20 degrees of trunk flexion  Right lateral flexion Decreased 100%  Left lateral flexion Decreased 100%  Right rotation   Left rotation    (Blank rows = not tested)  LOWER EXTREMITY ROM:     Active  Right eval Left eval  Hip flexion 60   Hip extension    Hip abduction 15   Hip adduction    Hip internal rotation    Hip external rotation    Knee flexion 86   Knee extension    Ankle dorsiflexion    Ankle plantarflexion    Ankle inversion    Ankle eversion     (Blank rows = not tested)  LOWER EXTREMITY MMT:    MMT Right eval Left eval  Hip flexion 3+ 4+  Hip extension    Hip abduction 4- 4+  Hip adduction  Hip internal rotation    Hip external rotation    Knee flexion 5 5  Knee extension 5 5  Ankle dorsiflexion 4 4  Ankle plantarflexion    Ankle inversion    Ankle eversion     (Blank rows = not tested)  LUMBAR SPECIAL TESTS:  Straight leg raise test: Negative  FUNCTIONAL TESTS:  Timed up and go (TUG): 29 s with SPC 3 minute walk test: with SPC, 200 feet had to stop at 2 min 10 sec due to fatigue and pain  GAIT: Distance walked: 200 feet Assistive device utilized: Single point cane Level of assistance: SBA Comments: hyperextends both knees. Forward flexed trunk  TREATMENT DATE:  02/22/24 Evaluation, education on sheet traction and HEP                                                                                                                                 PATIENT EDUCATION:  Education details: HEP/POC Person educated:  Patient Education method: Programmer, multimedia, Facilities manager, Verbal cues, and Handouts Education comprehension: verbalized understanding  HOME EXERCISE PROGRAM: Access Code: [email or web address] URL: [email or web address] Date: 02/22/2024 Prepared by: Ozell Mainland  Exercises - Self Traction in Standing with Counter Top  - 3 x daily - 7 x weekly - 1 sets - 5 reps - 30 hold - Supine Piriformis Stretch Pulling Heel to Hip  - 3 x daily - 7 x weekly - 1 sets - 5 reps - 30 hold  ASSESSMENT:  CLINICAL IMPRESSION: Patient is a 63 y.o. male who was seen today for physical therapy evaluation and treatment for back pain and debility. He has had multiple back surgeries, cervical, thoracic and lumbar, the MRI results are noted above.  He has tremendous spasms in the lumbar, thoracic and buttock mms.  He is struggling to walk, was able to go 2 min 10 seconds at 200 feet but had to stop due to pain and fatigue.  He hyperextends the knees, has forward flexed trunk.  Weak core.  I tried manual sheet traction and this seemed to help some, did a demo of the precussion massager and this also seemed to help the tension  OBJECTIVE IMPAIRMENTS: Abnormal gait, cardiopulmonary status limiting activity, decreased activity tolerance, decreased coordination, decreased endurance, decreased mobility, difficulty walking, decreased ROM, decreased strength, increased fascial restrictions, increased muscle spasms, impaired flexibility, improper body mechanics, postural dysfunction, and pain.   REHAB POTENTIAL: Good  CLINICAL DECISION MAKING: Evolving/moderate complexity  EVALUATION COMPLEXITY: Moderate   GOALS: Goals reviewed with patient? Yes  SHORT TERM GOALS: Target date: 03/14/24  Independent with initial HEP Baseline: Goal status: INITIAL  LONG TERM GOALS: Target date: 05/23/24  Independent with advanced HEP Baseline:  Goal status: INITIAL  2.  Understand posture and body mechanics Baseline:  Goal  status: INITIAL  3.  Decrease TUG time to 18 seconds Baseline:  Goal status: INITIAL  4.  Improve 3 minute walk test to 400 feet  Baseline:  Goal status: INITIAL  5.  Report able to get in and out of car without pain > 5/10 Baseline:  Goal status: INITIAL  6.  Report pain with dressing decreased 25% Baseline:  Goal status: INITIAL  PLAN:  PT FREQUENCY: 1x/week  PT DURATION: 12 weeks  PLANNED INTERVENTIONS: 97164- PT Re-evaluation, 97110-Therapeutic exercises, 97530- Therapeutic activity, 97112- Neuromuscular re-education, 97535- Self Care, 02859- Manual therapy, G0283- Electrical stimulation (unattended), 02964- Ultrasound, Patient/Family education, Balance training, Stair training, Taping, Joint mobilization, Cryotherapy, and Moist heat.  PLAN FOR NEXT SESSION: try some core strength, some LE flexibility, STM with percussion massager   Keyri Salberg W, PT 02/22/2024, 9:29 AM

## 2024-02-23 ENCOUNTER — Ambulatory Visit: Admitting: Physical Therapy

## 2024-03-02 ENCOUNTER — Encounter: Payer: Self-pay | Admitting: Physical Therapy

## 2024-03-02 ENCOUNTER — Ambulatory Visit: Admitting: Physical Therapy

## 2024-03-02 DIAGNOSIS — M62830 Muscle spasm of back: Secondary | ICD-10-CM

## 2024-03-02 DIAGNOSIS — M6281 Muscle weakness (generalized): Secondary | ICD-10-CM

## 2024-03-02 DIAGNOSIS — M4714 Other spondylosis with myelopathy, thoracic region: Secondary | ICD-10-CM

## 2024-03-02 DIAGNOSIS — M5459 Other low back pain: Secondary | ICD-10-CM

## 2024-03-02 NOTE — Therapy (Signed)
 OUTPATIENT PHYSICAL THERAPY THORACOLUMBAR TREATMENT   Patient Name: Chase Gonzalez MRN: [id number] DOB:1960-07-13, 63 y.o., male Today's Date: 03/02/2024  END OF SESSION:  PT End of Session - 03/02/24 1510     Visit Number 2    Date for PT Re-Evaluation 05/23/24    PT Start Time 1515    PT Stop Time 1600    PT Time Calculation (min) 45 min    Activity Tolerance Patient tolerated treatment well    Behavior During Therapy [facility] for tasks assessed/performed          Past Medical History:  Diagnosis Date   Anxiety    Arthritis    Constipation due to opioid therapy    Depression    due to pain   History of kidney stones    history of previous kidney stones, several    Neuropathy    Past Surgical History:  Procedure Laterality Date   ANTERIOR CERVICAL DECOMP/DISCECTOMY FUSION  10/15/2017   Dr. Louis   ANTERIOR LAT LUMBAR FUSION Left 09/09/2022   Procedure: LEFT LUMBAR TWO-THREE, LUMBAR THREE-FOUR ANTERIOR LUMBAR INTERBODY FUSION WITH LATERAL PLATE;  Surgeon: Louis Shove, MD;  Location: [facility] OR;  Service: Neurosurgery;  Laterality: Left;   APPLICATION OF ROBOTIC ASSISTANCE FOR SPINAL PROCEDURE N/A 05/22/2020   Procedure: APPLICATION OF ROBOTIC ASSISTANCE FOR SPINAL PROCEDURE;  Surgeon: Louis Shove, MD;  Location: [facility] OR;  Service: Neurosurgery;  Laterality: N/A;   COLONOSCOPY     HARDWARE REMOVAL N/A 12/09/2019   Procedure: Removal of Lumbar Four, Lumbar Five and Sacral One pedicle screws;  Surgeon: Louis Shove, MD;  Location: [facility] OR;  Service: Neurosurgery;  Laterality: N/A;  Removal of Lumbar Four, Lumbar Five and Sacral One pedicle screws   HARDWARE REMOVAL Bilateral 03/03/2022   Procedure: Removal of bilateral Lumbar two pedicle screws;  Surgeon: Louis Shove, MD;  Location: [facility] OR;  Service: Neurosurgery;  Laterality: Bilateral;   JOINT REPLACEMENT Left    Knee   LAMINECTOMY WITH POSTERIOR LATERAL ARTHRODESIS LEVEL 4 N/A 05/15/2020   Procedure: CANCELLED PROCEDURE;  Surgeon: Louis Shove, MD;  Location: [facility] OR;  Service: Neurosurgery;  Laterality: N/A;   LAMINECTOMY WITH POSTERIOR LATERAL ARTHRODESIS LEVEL 4 N/A 05/22/2020   Procedure: Posterior lateral fusion - Lumbar two-Sacral one with Sacral two AIar/iIliac screws - Mazor;  Surgeon: Louis Shove, MD;  Location: [facility] OR;  Service: Neurosurgery;  Laterality: N/A;   posterior cervical disc fusion  02/26/2017   ROTATOR CUFF REPAIR Bilateral    THORACIC DISC SURGERY     2017, 2 ruptured discs, had emergency surgery   Patient Active Problem List   Diagnosis Date Noted   Thoracic myelopathy 04/29/2023   Nerve pain 04/29/2023   Spasticity 04/29/2023   Lumbar foraminal stenosis 09/09/2022   Lumbar pseudoarthrosis 05/22/2020   Painful orthopaedic hardware (HCC) 12/09/2019   Postoperative pain 01/09/2019   Post-operative pain 01/08/2019   Hospice care patient 12/29/2018   Palliative care patient 12/29/2018   Degenerative spondylolisthesis 12/28/2018   Trochanteric bursitis, left hip 01/15/2017    PCP: KYM Lee, MD  REFERRING PROVIDER: Stacy, NP  REFERRING DIAG: Thoracic radiculopathy  Rationale for Evaluation and Treatment: Rehabilitation  THERAPY DIAG:  Thoracic myelopathy  Other low back pain  Muscle spasm of back  Muscle weakness (generalized)  ONSET DATE: 01/07/24  SUBJECTIVE:  SUBJECTIVE STATEMENT:  Last week was on his R side, thins week its on his L. Wants to walk without pain.   Patient reports that about a year ago he had a lumbar fusion, but put stress on the thoracic spine.  He has had tremendous issues with the back.  He reports that he feels the biggest issue is pain and spasms in the right low back and buttock  PERTINENT HISTORY:  ACDF, lumbar fusion, thoracic laminectomy  PAIN:  Are you having pain? Yes:  NPRS scale: 8/10 Pain location: right buttock and low back Pain description: spasm, tightness, sharp Aggravating factors: bending leg up into car, trying to cross right leg over pain up to 10/10, reports cannot walk 100 yards due to pain Relieving factors: tried head, tried ice, rest and being still, uses a walker in the house, pain meds pain a 3/10 at best  PRECAUTIONS: Back  RED FLAGS: None   WEIGHT BEARING RESTRICTIONS: No  FALLS:  Has patient fallen in last 6 months? No  LIVING ENVIRONMENT: Lives with: lives with their family Lives in: House/apartment Stairs: No Has following equipment at home: Single point cane, Environmental consultant - 4 wheeled, Tour manager, and Grab bars  OCCUPATION: desk job  PLOF: Independent with community mobility with device  PATIENT GOALS: have less pain  NEXT MD VISIT: end of October  OBJECTIVE:  Note: Objective measures were completed at Evaluation unless otherwise noted.  DIAGNOSTIC FINDINGS:   IMPRESSION: 1. Interval postoperative changes of L1-L3 XLIF and lateral spinal fusion with removal of pedicle screws at L2. No evidence of hardware complication. 2. Unchanged postoperative appearance of prior L4-S1 interbody and L3 to pelvis posterior spinal fusion. 3. Interval progression of degenerative endplate changes at T12-L1, with disc bulge and left-greater-than-right facet arthropathy resulting in moderate spinal canal stenosis and moderate left neural foraminal narrowing.  IMPRESSION: 1. Prior laminectomy at T10. Faint signal change of the cord at this level, likely on the basis of myelomalacia as result of prior stenosis. 2. Spondylotic changes in the thoracic spine with moderate canal stenosis at C7-T1, T11-12, and T12-L1 secondary to disc bulging and facet arthropathy. Mild canal stenoses at other levels. 3. Moderate to severe foraminal stenoses bilaterally at T1-T2, T2-T3, T3-T4, T10-11, T11-12, and T12-L1. Mild-to-moderate  foraminal stenoses at other levels.  COGNITION: Overall cognitive status: Within functional limits for tasks assessed     SENSATION: WFL  MUSCLE LENGTH: POSTURE: rounded shoulders, forward head, decreased lumbar lordosis, and weight shift right  PALPATION: Patient is extremely tight in the mms of the low back, mid back, LE's and the upper traps, does have some diastisis rect  LUMBAR ROM:   AROM eval  Flexion Decreased 50%  Extension Unable to get to neutral is in about 20 degrees of trunk flexion  Right lateral flexion Decreased 100%  Left lateral flexion Decreased 100%  Right rotation   Left rotation    (Blank rows = not tested)  LOWER EXTREMITY ROM:     Active  Right eval Left eval  Hip flexion 60   Hip extension    Hip abduction 15   Hip adduction    Hip internal rotation    Hip external rotation    Knee flexion 86   Knee extension    Ankle dorsiflexion    Ankle plantarflexion    Ankle inversion    Ankle eversion     (Blank rows = not tested)  LOWER EXTREMITY MMT:    MMT Right eval Left eval  Hip flexion 3+ 4+  Hip extension    Hip abduction 4- 4+  Hip adduction    Hip internal rotation    Hip external rotation    Knee flexion 5 5  Knee extension 5 5  Ankle dorsiflexion 4 4  Ankle plantarflexion    Ankle inversion    Ankle eversion     (Blank rows = not tested)  LUMBAR SPECIAL TESTS:  Straight leg raise test: Negative  FUNCTIONAL TESTS:  Timed up and go (TUG): 29 s with SPC 3 minute walk test: with SPC, 200 feet had to stop at 2 min 10 sec due to fatigue and pain  GAIT: Distance walked: 200 feet Assistive device utilized: Single point cane Level of assistance: SBA Comments: hyperextends both knees. Forward flexed trunk  TREATMENT DATE:  03/02/24 STM to lumbar spine with Tgun NuStep L 5 x 6 min Hs curls 25lb 2x10 Leg Ext 5lb 2x10 Seated rows blue 2x10  Seated OHP 4lb 2x10 Shoulder Ext red 2x10  Alt 6in box taps w/ [facility]    02/22/24 Evaluation, education on sheet traction and HEP                                                                                                                                 PATIENT EDUCATION:  Education details: HEP/POC Person educated: Patient Education method: Programmer, multimedia, Facilities manager, Verbal cues, and Handouts Education comprehension: verbalized understanding  HOME EXERCISE PROGRAM: Access Code: [email or web address] URL: [email or web address] Date: 02/22/2024 Prepared by: Ozell Mainland  Exercises - Self Traction in Standing with Counter Top  - 3 x daily - 7 x weekly - 1 sets - 5 reps - 30 hold - Supine Piriformis Stretch Pulling Heel to Hip  - 3 x daily - 7 x weekly - 1 sets - 5 reps - 30 hold  ASSESSMENT:  CLINICAL IMPRESSION: Patient is a 63 y.o. male who was seen today for physical therapy treatment for back pain and debility. He has had multiple back surgeries, cervical, thoracic and lumbar, the MRI results are noted above.  He hyperextends the knees, has forward flexed trunk.  Weak core. Introduced pt to some light therapeutic activities. LLE weakness weaker than R, cue to keep LE in contact with pad while doing extensions. CGA needed with alt box taps.Cues throughout to maintain upright posture. Pt has rigid mobility throughout   OBJECTIVE IMPAIRMENTS: Abnormal gait, cardiopulmonary status limiting activity, decreased activity tolerance, decreased coordination, decreased endurance, decreased mobility, difficulty walking, decreased ROM, decreased strength, increased fascial restrictions, increased muscle spasms, impaired flexibility, improper body mechanics, postural dysfunction, and pain.   REHAB POTENTIAL: Good  CLINICAL DECISION MAKING: Evolving/moderate complexity  EVALUATION COMPLEXITY: Moderate   GOALS: Goals reviewed with patient? Yes  SHORT TERM GOALS: Target date: 03/14/24  Independent with initial HEP Baseline: Goal status: Progressing  03/02/24  LONG TERM GOALS: Target date: 05/23/24  Independent with advanced HEP Baseline:  Goal status: INITIAL  2.  Understand posture and body mechanics Baseline:  Goal status: INITIAL  3.  Decrease TUG time to 18 seconds Baseline:  Goal status: INITIAL  4.  Improve 3 minute walk test to 400 feet Baseline:  Goal status: INITIAL  5.  Report able to get in and out of car without pain > 5/10 Baseline:  Goal status: INITIAL  6.  Report pain with dressing decreased 25% Baseline:  Goal status: INITIAL  PLAN:  PT FREQUENCY: 1x/week  PT DURATION: 12 weeks  PLANNED INTERVENTIONS: 97164- PT Re-evaluation, 97110-Therapeutic exercises, 97530- Therapeutic activity, 97112- Neuromuscular re-education, 97535- Self Care, 02859- Manual therapy, G0283- Electrical stimulation (unattended), 02964- Ultrasound, Patient/Family education, Balance training, Stair training, Taping, Joint mobilization, Cryotherapy, and Moist heat.  PLAN FOR NEXT SESSION: try some core strength, some LE flexibility, STM with percussion massager   Tanda KANDICE Sorrow, PTA 03/02/2024, 3:11 PM

## 2024-03-09 ENCOUNTER — Ambulatory Visit: Admitting: Physical Therapy

## 2024-03-16 ENCOUNTER — Ambulatory Visit: Attending: Student | Admitting: Physical Therapy

## 2024-03-16 ENCOUNTER — Encounter: Payer: Self-pay | Admitting: Physical Therapy

## 2024-03-16 DIAGNOSIS — M6281 Muscle weakness (generalized): Secondary | ICD-10-CM | POA: Insufficient documentation

## 2024-03-16 DIAGNOSIS — M62830 Muscle spasm of back: Secondary | ICD-10-CM | POA: Insufficient documentation

## 2024-03-16 DIAGNOSIS — M5459 Other low back pain: Secondary | ICD-10-CM | POA: Insufficient documentation

## 2024-03-16 DIAGNOSIS — M4714 Other spondylosis with myelopathy, thoracic region: Secondary | ICD-10-CM | POA: Insufficient documentation

## 2024-03-16 NOTE — Therapy (Signed)
 OUTPATIENT PHYSICAL THERAPY THORACOLUMBAR TREATMENT   Patient Name: Chase Gonzalez MRN: [id number] DOB:08-Dec-1960, 63 y.o., male Today's Date: 03/16/2024  END OF SESSION:  PT End of Session - 03/16/24 1528     Visit Number 3    Date for Recertification  05/23/24    Authorization Type [employer]    PT Start Time 1527    PT Stop Time 1615    PT Time Calculation (min) 48 min    Activity Tolerance Patient tolerated treatment well    Behavior During Therapy WFL for tasks assessed/performed          Past Medical History:  Diagnosis Date   Anxiety    Arthritis    Constipation due to opioid therapy    Depression    due to pain   History of kidney stones    history of previous kidney stones, several    Neuropathy    Past Surgical History:  Procedure Laterality Date   ANTERIOR CERVICAL DECOMP/DISCECTOMY FUSION  10/15/2017   Dr. Louis   ANTERIOR LAT LUMBAR FUSION Left 09/09/2022   Procedure: LEFT LUMBAR TWO-THREE, LUMBAR THREE-FOUR ANTERIOR LUMBAR INTERBODY FUSION WITH LATERAL PLATE;  Surgeon: Louis Shove, MD;  Location: [facility] OR;  Service: Neurosurgery;  Laterality: Left;   APPLICATION OF ROBOTIC ASSISTANCE FOR SPINAL PROCEDURE N/A 05/22/2020   Procedure: APPLICATION OF ROBOTIC ASSISTANCE FOR SPINAL PROCEDURE;  Surgeon: Louis Shove, MD;  Location: [facility] OR;  Service: Neurosurgery;  Laterality: N/A;   COLONOSCOPY     HARDWARE REMOVAL N/A 12/09/2019   Procedure: Removal of Lumbar Four, Lumbar Five and Sacral One pedicle screws;  Surgeon: Louis Shove, MD;  Location: [facility] OR;  Service: Neurosurgery;  Laterality: N/A;  Removal of Lumbar Four, Lumbar Five and Sacral One pedicle screws   HARDWARE REMOVAL Bilateral 03/03/2022   Procedure: Removal of bilateral Lumbar two pedicle screws;  Surgeon: Louis Shove, MD;  Location: [facility] OR;  Service: Neurosurgery;  Laterality: Bilateral;   JOINT REPLACEMENT Left    Knee   LAMINECTOMY WITH POSTERIOR LATERAL ARTHRODESIS LEVEL 4 N/A 05/15/2020   Procedure:  CANCELLED PROCEDURE;  Surgeon: Louis Shove, MD;  Location: [facility] OR;  Service: Neurosurgery;  Laterality: N/A;   LAMINECTOMY WITH POSTERIOR LATERAL ARTHRODESIS LEVEL 4 N/A 05/22/2020   Procedure: Posterior lateral fusion - Lumbar two-Sacral one with Sacral two AIar/iIliac screws - Mazor;  Surgeon: Louis Shove, MD;  Location: [facility] OR;  Service: Neurosurgery;  Laterality: N/A;   posterior cervical disc fusion  02/26/2017   ROTATOR CUFF REPAIR Bilateral    THORACIC DISC SURGERY     2017, 2 ruptured discs, had emergency surgery   Patient Active Problem List   Diagnosis Date Noted   Thoracic myelopathy 04/29/2023   Nerve pain 04/29/2023   Spasticity 04/29/2023   Lumbar foraminal stenosis 09/09/2022   Lumbar pseudoarthrosis 05/22/2020   Painful orthopaedic hardware 12/09/2019   Postoperative pain 01/09/2019   Post-operative pain 01/08/2019   Hospice care patient 12/29/2018   Palliative care patient 12/29/2018   Degenerative spondylolisthesis 12/28/2018   Trochanteric bursitis, left hip 01/15/2017    PCP: KYM Lee, MD  REFERRING PROVIDER: Stacy, NP  REFERRING DIAG: Thoracic radiculopathy  Rationale for Evaluation and Treatment: Rehabilitation  THERAPY DIAG:  Thoracic myelopathy  Other low back pain  Muscle spasm of back  Muscle weakness (generalized)  ONSET DATE: 01/07/24  SUBJECTIVE:  SUBJECTIVE STATEMENT:  I am doing a little better, a little straighter, denies pain now, no falls  Patient reports that about a year ago he had a lumbar fusion, but put stress on the thoracic spine.  He has had tremendous issues with the back.  He reports that he feels the biggest issue is pain and spasms in the right low back and buttock  PERTINENT HISTORY:  ACDF, lumbar fusion, thoracic laminectomy  PAIN:  Are  you having pain? Yes: NPRS scale: 8/10 Pain location: right buttock and low back Pain description: spasm, tightness, sharp Aggravating factors: bending leg up into car, trying to cross right leg over pain up to 10/10, reports cannot walk 100 yards due to pain Relieving factors: tried head, tried ice, rest and being still, uses a walker in the house, pain meds pain a 3/10 at best  PRECAUTIONS: Back  RED FLAGS: None   WEIGHT BEARING RESTRICTIONS: No  FALLS:  Has patient fallen in last 6 months? No  LIVING ENVIRONMENT: Lives with: lives with their family Lives in: House/apartment Stairs: No Has following equipment at home: Single point cane, Environmental consultant - 4 wheeled, Tour manager, and Grab bars  OCCUPATION: desk job  PLOF: Independent with community mobility with device  PATIENT GOALS: have less pain  NEXT MD VISIT: end of October  OBJECTIVE:  Note: Objective measures were completed at Evaluation unless otherwise noted.  DIAGNOSTIC FINDINGS:   IMPRESSION: 1. Interval postoperative changes of L1-L3 XLIF and lateral spinal fusion with removal of pedicle screws at L2. No evidence of hardware complication. 2. Unchanged postoperative appearance of prior L4-S1 interbody and L3 to pelvis posterior spinal fusion. 3. Interval progression of degenerative endplate changes at T12-L1, with disc bulge and left-greater-than-right facet arthropathy resulting in moderate spinal canal stenosis and moderate left neural foraminal narrowing.  IMPRESSION: 1. Prior laminectomy at T10. Faint signal change of the cord at this level, likely on the basis of myelomalacia as result of prior stenosis. 2. Spondylotic changes in the thoracic spine with moderate canal stenosis at C7-T1, T11-12, and T12-L1 secondary to disc bulging and facet arthropathy. Mild canal stenoses at other levels. 3. Moderate to severe foraminal stenoses bilaterally at T1-T2, T2-T3, T3-T4, T10-11, T11-12, and T12-L1.  Mild-to-moderate foraminal stenoses at other levels.  COGNITION: Overall cognitive status: Within functional limits for tasks assessed     SENSATION: WFL  MUSCLE LENGTH: POSTURE: rounded shoulders, forward head, decreased lumbar lordosis, and weight shift right  PALPATION: Patient is extremely tight in the mms of the low back, mid back, LE's and the upper traps, does have some diastisis rect  LUMBAR ROM:   AROM eval  Flexion Decreased 50%  Extension Unable to get to neutral is in about 20 degrees of trunk flexion  Right lateral flexion Decreased 100%  Left lateral flexion Decreased 100%  Right rotation   Left rotation    (Blank rows = not tested)  LOWER EXTREMITY ROM:     Active  Right eval Left eval  Hip flexion 60   Hip extension    Hip abduction 15   Hip adduction    Hip internal rotation    Hip external rotation    Knee flexion 86   Knee extension    Ankle dorsiflexion    Ankle plantarflexion    Ankle inversion    Ankle eversion     (Blank rows = not tested)  LOWER EXTREMITY MMT:    MMT Right eval Left eval  Hip flexion 3+ 4+  Hip extension    Hip abduction 4- 4+  Hip adduction    Hip internal rotation    Hip external rotation    Knee flexion 5 5  Knee extension 5 5  Ankle dorsiflexion 4 4  Ankle plantarflexion    Ankle inversion    Ankle eversion     (Blank rows = not tested)  LUMBAR SPECIAL TESTS:  Straight leg raise test: Negative  FUNCTIONAL TESTS:  Timed up and go (TUG): 29 s with SPC 3 minute walk test: with SPC, 200 feet had to stop at 2 min 10 sec due to fatigue and pain  GAIT: Distance walked: 200 feet Assistive device utilized: Single point cane Level of assistance: SBA Comments: hyperextends both knees. Forward flexed trunk  TREATMENT DATE:  03/16/24 Leg curls 25# x10, then 20# single legs x 10 Leg extension 5# x10, then single legs x 10 Nustep level 4 x 6 minutes Slant board stretch 5# straight arm pulls cues for  posture and core Seated red weighted ball lifts out front for posterior work Feet on ball K2C, rotation, small bridge with legs straight, with leg legs bent had cramps, isometric abs Supine with towel roll long ways along spine in thoracic Passive stretch to the LE's, HS, piriformis, adductors and quads STM with the Tgun in sitting to the low back Added to HEP with ball   03/02/24 STM to lumbar spine with Tgun NuStep L 5 x 6 min Hs curls 25lb 2x10 Leg Ext 5lb 2x10 Seated rows blue 2x10  Seated OHP 4lb 2x10 Shoulder Ext red 2x10  Alt 6in box taps w/ [facility]   02/22/24 Evaluation, education on sheet traction and HEP                                                                                                                                 PATIENT EDUCATION:  Education details: HEP/POC Person educated: Patient Education method: Programmer, multimedia, Facilities manager, Verbal cues, and Handouts Education comprehension: verbalized understanding  HOME EXERCISE PROGRAM: Access Code: [email or web address] URL: [email or web address] Date: 02/22/2024 Prepared by: Ozell Mainland  Exercises - Self Traction in Standing with Counter Top  - 3 x daily - 7 x weekly - 1 sets - 5 reps - 30 hold - Supine Piriformis Stretch Pulling Heel to Hip  - 3 x daily - 7 x weekly - 1 sets - 5 reps - 30 hold  ASSESSMENT:  CLINICAL IMPRESSION: Patient missed an appointment last week, he reports that he really struggles to walk especially after any sitting, he does feel like the stretches at home are helping.  I added some core activities in standing and sitting, needs a lot of verbal and tactile cues to try to maintain posture.  Added supine ball exercises had cramps in the HS with the knees bent.  Very tight mms but reports feels good with the stretches.  Has a significant diastisis recti   Patient is a 63  y.o. male who was seen today for physical therapy treatment for back pain and debility. He has had multiple back  surgeries, cervical, thoracic and lumbar, the MRI results are noted above.  He hyperextends the knees, has forward flexed trunk.  Weak core. Introduced pt to some light therapeutic activities. LLE weakness weaker than R, cue to keep LE in contact with pad while doing extensions. CGA needed with alt box taps.Cues throughout to maintain upright posture. Pt has rigid mobility throughout   OBJECTIVE IMPAIRMENTS: Abnormal gait, cardiopulmonary status limiting activity, decreased activity tolerance, decreased coordination, decreased endurance, decreased mobility, difficulty walking, decreased ROM, decreased strength, increased fascial restrictions, increased muscle spasms, impaired flexibility, improper body mechanics, postural dysfunction, and pain.   REHAB POTENTIAL: Good  CLINICAL DECISION MAKING: Evolving/moderate complexity  EVALUATION COMPLEXITY: Moderate   GOALS: Goals reviewed with patient? Yes  SHORT TERM GOALS: Target date: 03/14/24  Independent with initial HEP Baseline: Goal status: met 03/16/24  LONG TERM GOALS: Target date: 05/23/24  Independent with advanced HEP Baseline:  Goal status: INITIAL  2.  Understand posture and body mechanics Baseline:  Goal status: INITIAL  3.  Decrease TUG time to 18 seconds Baseline:  Goal status: INITIAL  4.  Improve 3 minute walk test to 400 feet Baseline:  Goal status: INITIAL  5.  Report able to get in and out of car without pain > 5/10 Baseline:  Goal status: INITIAL  6.  Report pain with dressing decreased 25% Baseline:  Goal status: INITIAL  PLAN:  PT FREQUENCY: 1x/week  PT DURATION: 12 weeks  PLANNED INTERVENTIONS: 97164- PT Re-evaluation, 97110-Therapeutic exercises, 97530- Therapeutic activity, 97112- Neuromuscular re-education, 97535- Self Care, 02859- Manual therapy, [id number]- Electrical stimulation (unattended), 02964- Ultrasound, Patient/Family education, Balance training, Stair training, Taping, Joint mobilization,  Cryotherapy, and Moist heat.  PLAN FOR NEXT SESSION: see how the last treatment did, the stretches felt good to him but may need to adjust.  Try to add core as he tolerates  Chase Gonzalez, PT 03/16/2024, 3:28 PM

## 2024-03-23 ENCOUNTER — Ambulatory Visit: Admitting: Physical Therapy

## 2024-03-23 ENCOUNTER — Encounter: Payer: Self-pay | Admitting: Physical Therapy

## 2024-03-23 DIAGNOSIS — M4714 Other spondylosis with myelopathy, thoracic region: Secondary | ICD-10-CM

## 2024-03-23 DIAGNOSIS — M5459 Other low back pain: Secondary | ICD-10-CM

## 2024-03-23 DIAGNOSIS — M6281 Muscle weakness (generalized): Secondary | ICD-10-CM

## 2024-03-23 DIAGNOSIS — M62830 Muscle spasm of back: Secondary | ICD-10-CM

## 2024-03-23 NOTE — Therapy (Signed)
 OUTPATIENT PHYSICAL THERAPY THORACOLUMBAR TREATMENT   Patient Name: Chase Gonzalez MRN: [id number] DOB:09/10/1960, 63 y.o., male Today's Date: 03/23/2024  END OF SESSION:  PT End of Session - 03/23/24 1516     Visit Number 4    Date for Recertification  05/23/24    PT Start Time 1515    PT Stop Time 1600    PT Time Calculation (min) 45 min    Activity Tolerance Patient tolerated treatment well    Behavior During Therapy [facility] for tasks assessed/performed          Past Medical History:  Diagnosis Date   Anxiety    Arthritis    Constipation due to opioid therapy    Depression    due to pain   History of kidney stones    history of previous kidney stones, several    Neuropathy    Past Surgical History:  Procedure Laterality Date   ANTERIOR CERVICAL DECOMP/DISCECTOMY FUSION  10/15/2017   Dr. Louis   ANTERIOR LAT LUMBAR FUSION Left 09/09/2022   Procedure: LEFT LUMBAR TWO-THREE, LUMBAR THREE-FOUR ANTERIOR LUMBAR INTERBODY FUSION WITH LATERAL PLATE;  Surgeon: Louis Shove, MD;  Location: [facility] OR;  Service: Neurosurgery;  Laterality: Left;   APPLICATION OF ROBOTIC ASSISTANCE FOR SPINAL PROCEDURE N/A 05/22/2020   Procedure: APPLICATION OF ROBOTIC ASSISTANCE FOR SPINAL PROCEDURE;  Surgeon: Louis Shove, MD;  Location: [facility] OR;  Service: Neurosurgery;  Laterality: N/A;   COLONOSCOPY     HARDWARE REMOVAL N/A 12/09/2019   Procedure: Removal of Lumbar Four, Lumbar Five and Sacral One pedicle screws;  Surgeon: Louis Shove, MD;  Location: [facility] OR;  Service: Neurosurgery;  Laterality: N/A;  Removal of Lumbar Four, Lumbar Five and Sacral One pedicle screws   HARDWARE REMOVAL Bilateral 03/03/2022   Procedure: Removal of bilateral Lumbar two pedicle screws;  Surgeon: Louis Shove, MD;  Location: [facility] OR;  Service: Neurosurgery;  Laterality: Bilateral;   JOINT REPLACEMENT Left    Knee   LAMINECTOMY WITH POSTERIOR LATERAL ARTHRODESIS LEVEL 4 N/A 05/15/2020   Procedure: CANCELLED PROCEDURE;  Surgeon: Louis Shove, MD;  Location: [facility] OR;  Service: Neurosurgery;  Laterality: N/A;   LAMINECTOMY WITH POSTERIOR LATERAL ARTHRODESIS LEVEL 4 N/A 05/22/2020   Procedure: Posterior lateral fusion - Lumbar two-Sacral one with Sacral two AIar/iIliac screws - Mazor;  Surgeon: Louis Shove, MD;  Location: [facility] OR;  Service: Neurosurgery;  Laterality: N/A;   posterior cervical disc fusion  02/26/2017   ROTATOR CUFF REPAIR Bilateral    THORACIC DISC SURGERY     2017, 2 ruptured discs, had emergency surgery   Patient Active Problem List   Diagnosis Date Noted   Thoracic myelopathy 04/29/2023   Nerve pain 04/29/2023   Spasticity 04/29/2023   Lumbar foraminal stenosis 09/09/2022   Lumbar pseudoarthrosis 05/22/2020   Painful orthopaedic hardware 12/09/2019   Postoperative pain 01/09/2019   Post-operative pain 01/08/2019   Hospice care patient 12/29/2018   Palliative care patient 12/29/2018   Degenerative spondylolisthesis 12/28/2018   Trochanteric bursitis, left hip 01/15/2017    PCP: KYM Lee, MD  REFERRING PROVIDER: Stacy, NP  REFERRING DIAG: Thoracic radiculopathy  Rationale for Evaluation and Treatment: Rehabilitation  THERAPY DIAG:  Thoracic myelopathy  Other low back pain  Muscle spasm of back  Muscle weakness (generalized)  ONSET DATE: 01/07/24  SUBJECTIVE:  SUBJECTIVE STATEMENT: A little bit better  Patient reports that about a year ago he had a lumbar fusion, but put stress on the thoracic spine.  He has had tremendous issues with the back.  He reports that he feels the biggest issue is pain and spasms in the right low back and buttock  PERTINENT HISTORY:  ACDF, lumbar fusion, thoracic laminectomy  PAIN:  Are you having pain? Yes: NPRS scale: 4/10 Pain location: right buttock and low back Pain  description: spasm, tightness, sharp Aggravating factors: bending leg up into car, trying to cross right leg over pain up to 10/10, reports cannot walk 100 yards due to pain Relieving factors: tried head, tried ice, rest and being still, uses a walker in the house, pain meds pain a 3/10 at best  PRECAUTIONS: Back  RED FLAGS: None   WEIGHT BEARING RESTRICTIONS: No  FALLS:  Has patient fallen in last 6 months? No  LIVING ENVIRONMENT: Lives with: lives with their family Lives in: House/apartment Stairs: No Has following equipment at home: Single point cane, Environmental consultant - 4 wheeled, Tour manager, and Grab bars  OCCUPATION: desk job  PLOF: Independent with community mobility with device  PATIENT GOALS: have less pain  NEXT MD VISIT: end of October  OBJECTIVE:  Note: Objective measures were completed at Evaluation unless otherwise noted.  DIAGNOSTIC FINDINGS:   IMPRESSION: 1. Interval postoperative changes of L1-L3 XLIF and lateral spinal fusion with removal of pedicle screws at L2. No evidence of hardware complication. 2. Unchanged postoperative appearance of prior L4-S1 interbody and L3 to pelvis posterior spinal fusion. 3. Interval progression of degenerative endplate changes at T12-L1, with disc bulge and left-greater-than-right facet arthropathy resulting in moderate spinal canal stenosis and moderate left neural foraminal narrowing.  IMPRESSION: 1. Prior laminectomy at T10. Faint signal change of the cord at this level, likely on the basis of myelomalacia as result of prior stenosis. 2. Spondylotic changes in the thoracic spine with moderate canal stenosis at C7-T1, T11-12, and T12-L1 secondary to disc bulging and facet arthropathy. Mild canal stenoses at other levels. 3. Moderate to severe foraminal stenoses bilaterally at T1-T2, T2-T3, T3-T4, T10-11, T11-12, and T12-L1. Mild-to-moderate foraminal stenoses at other levels.  COGNITION: Overall cognitive status:  Within functional limits for tasks assessed     SENSATION: WFL  MUSCLE LENGTH: POSTURE: rounded shoulders, forward head, decreased lumbar lordosis, and weight shift right  PALPATION: Patient is extremely tight in the mms of the low back, mid back, LE's and the upper traps, does have some diastisis rect  LUMBAR ROM:   AROM eval 03/23/24  Flexion Decreased 50% Decreased 25%  Extension Unable to get to neutral is in about 20 degrees of trunk flexion Neutral   Right lateral flexion Decreased 100% Decreased 50%  Left lateral flexion Decreased 100% Decreased 75%  Right rotation    Left rotation     (Blank rows = not tested)  LOWER EXTREMITY ROM:     Active  Right eval Left eval  Hip flexion 60   Hip extension    Hip abduction 15   Hip adduction    Hip internal rotation    Hip external rotation    Knee flexion 86   Knee extension    Ankle dorsiflexion    Ankle plantarflexion    Ankle inversion    Ankle eversion     (Blank rows = not tested)  LOWER EXTREMITY MMT:    MMT Right eval Left eval  Hip flexion 3+ 4+  Hip extension    Hip abduction 4- 4+  Hip adduction    Hip internal rotation    Hip external rotation    Knee flexion 5 5  Knee extension 5 5  Ankle dorsiflexion 4 4  Ankle plantarflexion    Ankle inversion    Ankle eversion     (Blank rows = not tested)  LUMBAR SPECIAL TESTS:  Straight leg raise test: Negative  FUNCTIONAL TESTS:  Timed up and go (TUG): 29 s with SPC 3 minute walk test: with SPC, 200 feet had to stop at 2 min 10 sec due to fatigue and pain  GAIT: Distance walked: 200 feet Assistive device utilized: Single point cane Level of assistance: SBA Comments: hyperextends both knees. Forward flexed trunk  TREATMENT DATE:  03/23/24 NuStep L5 x 7 min Leg curls 35# 2x10, then 20# single legs 2x 10 Leg extension 10# x10, 5lb single legs x 10 Goals   TUG 18.53 sec  ROM Shoulder Ext 5lb 2x10  Alt 6in box taps  with [employer] board  calf stretch    03/16/24 Leg curls 25# x10, then 20# single legs x 10 Leg extension 5# x10, then single legs x 10 Nustep level 4 x 6 minutes Slant board stretch 5# straight arm pulls cues for posture and core Seated red weighted ball lifts out front for posterior work Feet on ball K2C, rotation, small bridge with legs straight, with leg legs bent had cramps, isometric abs Supine with towel roll long ways along spine in thoracic Passive stretch to the LE's, HS, piriformis, adductors and quads STM with the Tgun in sitting to the low back Added to HEP with ball   03/02/24 STM to lumbar spine with Tgun NuStep L 5 x 6 min Hs curls 25lb 2x10 Leg Ext 5lb 2x10 Seated rows blue 2x10  Seated OHP 4lb 2x10 Shoulder Ext red 2x10  Alt 6in box taps w/ [facility]   02/22/24 Evaluation, education on sheet traction and HEP                                                                                                                                 PATIENT EDUCATION:  Education details: HEP/POC Person educated: Patient Education method: Programmer, multimedia, Facilities manager, Verbal cues, and Handouts Education comprehension: verbalized understanding  HOME EXERCISE PROGRAM: Access Code: [email or web address] URL: [email or web address] Date: 02/22/2024 Prepared by: Ozell Mainland  Exercises - Self Traction in Standing with Counter Top  - 3 x daily - 7 x weekly - 1 sets - 5 reps - 30 hold - Supine Piriformis Stretch Pulling Heel to Hip  - 3 x daily - 7 x weekly - 1 sets - 5 reps - 30 hold  ASSESSMENT:  CLINICAL IMPRESSION: Pt enters doing well, he continues to report that stretching at home helps. Some progress has been pain with lumbar ROM and decreasing TUG time.   A lot of verbal and tactile cues to  try to maintain posture with shoulder Ext.  Increase resistance tolerated with seated leg curls and extensions. He returns to the MD tomorrow, will call to schedule more appointments or to DC.   Patient  is a 63 y.o. male who was seen today for physical therapy treatment for back pain and debility. He has had multiple back surgeries, cervical, thoracic and lumbar, the MRI results are noted above.  He hyperextends the knees, has forward flexed trunk.  Weak core. Introduced pt to some light therapeutic activities. LLE weakness weaker than R, cue to keep LE in contact with pad while doing extensions. CGA needed with alt box taps.Cues throughout to maintain upright posture. Pt has rigid mobility throughout   OBJECTIVE IMPAIRMENTS: Abnormal gait, cardiopulmonary status limiting activity, decreased activity tolerance, decreased coordination, decreased endurance, decreased mobility, difficulty walking, decreased ROM, decreased strength, increased fascial restrictions, increased muscle spasms, impaired flexibility, improper body mechanics, postural dysfunction, and pain.   REHAB POTENTIAL: Good  CLINICAL DECISION MAKING: Evolving/moderate complexity  EVALUATION COMPLEXITY: Moderate   GOALS: Goals reviewed with patient? Yes  SHORT TERM GOALS: Target date: 03/14/24  Independent with initial HEP Baseline: Goal status: met 03/16/24  LONG TERM GOALS: Target date: 05/23/24  Independent with advanced HEP Baseline:  Goal status: INITIAL  2.  Understand posture and body mechanics Baseline:  Goal status: INITIAL  3.  Decrease TUG time to 18 seconds Baseline:  Goal status: Progressing 18.53  4.  Improve 3 minute walk test to 400 feet Baseline:  Goal status: INITIAL  5.  Report able to get in and out of car without pain > 5/10 Baseline:  Goal status: ongoing more so in the afternoon 03/23/24  6.  Report pain with dressing decreased 25% Baseline:  Goal status: INITIAL  PLAN:  PT FREQUENCY: 1x/week  PT DURATION: 12 weeks  PLANNED INTERVENTIONS: 97164- PT Re-evaluation, 97110-Therapeutic exercises, 97530- Therapeutic activity, 97112- Neuromuscular re-education, 97535- Self Care, 02859-  Manual therapy, G0283- Electrical stimulation (unattended), 02964- Ultrasound, Patient/Family education, Balance training, Stair training, Taping, Joint mobilization, Cryotherapy, and Moist heat.  PLAN FOR NEXT SESSION: see how the last treatment did, the stretches felt good to him but may need to adjust.  Try to add core as he tolerates  Tanda KANDICE Sorrow, PTA 03/23/2024, 3:16 PM

## 2024-04-17 ENCOUNTER — Other Ambulatory Visit: Payer: Self-pay | Admitting: Physical Medicine & Rehabilitation

## 2024-04-17 DIAGNOSIS — M792 Neuralgia and neuritis, unspecified: Secondary | ICD-10-CM

## 2024-04-17 DIAGNOSIS — R252 Cramp and spasm: Secondary | ICD-10-CM

## 2024-04-17 DIAGNOSIS — M4714 Other spondylosis with myelopathy, thoracic region: Secondary | ICD-10-CM

## 2024-05-04 ENCOUNTER — Telehealth: Payer: Self-pay

## 2024-05-04 ENCOUNTER — Encounter: Payer: Self-pay | Admitting: Physical Medicine & Rehabilitation

## 2024-05-04 ENCOUNTER — Encounter: Attending: Physical Medicine & Rehabilitation | Admitting: Physical Medicine & Rehabilitation

## 2024-05-04 VITALS — BP 131/84 | HR 93 | Ht 71.0 in | Wt 232.0 lb

## 2024-05-04 DIAGNOSIS — M792 Neuralgia and neuritis, unspecified: Secondary | ICD-10-CM | POA: Diagnosis not present

## 2024-05-04 DIAGNOSIS — M4714 Other spondylosis with myelopathy, thoracic region: Secondary | ICD-10-CM

## 2024-05-04 DIAGNOSIS — R252 Cramp and spasm: Secondary | ICD-10-CM | POA: Insufficient documentation

## 2024-05-04 MED ORDER — BUPRENORPHINE 5 MCG/HR TD PTWK: 1.0000 | MEDICATED_PATCH | TRANSDERMAL | 4 refills | Status: DC

## 2024-05-04 NOTE — Progress Notes (Signed)
 Subjective:    Patient ID: Chase Gonzalez, male    DOB: 18-Sep-1960, 63 y.o.   MRN: [id number]  HPI  Discussed the use of AI scribe software for clinical note transcription with the patient, who gave verbal consent to proceed.  History of Present Illness Chase Gonzalez is a 63 year old male with thoracic myelopathy and spasticity who presents with ongoing pain management issues.  Thoracic myelopathy and spasticity - Ongoing thoracic myelopathy with associated spasticity - Baclofen  and gabapentin  have reduced spasticity, but pain persists on the right side - Occasional spasms, particularly in the right leg, often triggered by certain activities or positions - Reluctant to undergo further surgery due to multiple prior surgical interventions - Under care of a neurosurgeon  Pain management - Persistent right-sided pain despite current medication regimen - Oxycodone  used for pain control, with approximately ten hours of relief per dose - Baclofen  and gabapentin  used regularly for spasticity and pain  Physical function and rehabilitation - Participates in physical therapy focused on posture and core strengthening, including use of weights and a yoga ball - Stretching in the morning improves functional ability, such as putting on socks - Physical therapy and exercise have been beneficial for overall function  Sleep disturbance - Difficulty sleeping in bed due to discomfort, primarily sleeps in a recliner - Can only lie on right side for a limited time before needing to change positions - Sleep disrupted by snoring and discomfort from left shoulder and elbow issues  Neurogenic bowel and bladder dysfunction - Neurogenic bowel managed with Senna and a diet including fruits such as grapes and apples - Bladder function adequate, but urination patterns change in relation to bowel movements  Metabolic syndrome and weight management - Diagnosed with diabetes - Actively managing weight through  dietary modifications, including eliminating sugar and reducing bread intake - Recent weight loss of two pounds by drinking water, black coffee, and avoiding snacks after 8 PM    Pain Inventory Average Pain 6 Pain Right Now 7 My pain is sharp, burning, tingling, and aching  In the last 24 hours, has pain interfered with the following? General activity 8 Relation with others 5 Enjoyment of life 10 What TIME of day is your pain at its worst? evening Sleep (in general) Fair  Pain is worse with: walking, standing, and some activites Pain improves with: medication Relief from Meds: 9  No family history on file. Social History   Socioeconomic History   Marital status: Married    Spouse name: Not on file   Number of children: Not on file   Years of education: Not on file   Highest education level: Not on file  Occupational History   Not on file  Tobacco Use   Smoking status: Never   Smokeless tobacco: Current    Types: Chew   Tobacco comments:    One can per week  Vaping Use   Vaping status: Never Used  Substance and Sexual Activity   Alcohol use: Not Currently    Comment: occasionally   Drug use: Not Currently   Sexual activity: Yes  Other Topics Concern   Not on file  Social History Narrative   Not on file   Social Drivers of Health   Financial Resource Strain: Not on file  Food Insecurity: Not on file  Transportation Needs: Not on file  Physical Activity: Not on file  Stress: Not on file  Social Connections: Unknown (10/25/2021)   Received from [facility]  Social Network    Social Network: Not on file   Past Surgical History:  Procedure Laterality Date   ANTERIOR CERVICAL DECOMP/DISCECTOMY FUSION  10/15/2017   Dr. Louis   ANTERIOR LAT LUMBAR FUSION Left 09/09/2022   Procedure: LEFT LUMBAR TWO-THREE, LUMBAR THREE-FOUR ANTERIOR LUMBAR INTERBODY FUSION WITH LATERAL PLATE;  Surgeon: Louis Shove, MD;  Location: [facility] OR;  Service: Neurosurgery;  Laterality:  Left;   APPLICATION OF ROBOTIC ASSISTANCE FOR SPINAL PROCEDURE N/A 05/22/2020   Procedure: APPLICATION OF ROBOTIC ASSISTANCE FOR SPINAL PROCEDURE;  Surgeon: Louis Shove, MD;  Location: [facility] OR;  Service: Neurosurgery;  Laterality: N/A;   COLONOSCOPY     HARDWARE REMOVAL N/A 12/09/2019   Procedure: Removal of Lumbar Four, Lumbar Five and Sacral One pedicle screws;  Surgeon: Louis Shove, MD;  Location: [facility] OR;  Service: Neurosurgery;  Laterality: N/A;  Removal of Lumbar Four, Lumbar Five and Sacral One pedicle screws   HARDWARE REMOVAL Bilateral 03/03/2022   Procedure: Removal of bilateral Lumbar two pedicle screws;  Surgeon: Louis Shove, MD;  Location: [facility] OR;  Service: Neurosurgery;  Laterality: Bilateral;   JOINT REPLACEMENT Left    Knee   LAMINECTOMY WITH POSTERIOR LATERAL ARTHRODESIS LEVEL 4 N/A 05/15/2020   Procedure: CANCELLED PROCEDURE;  Surgeon: Louis Shove, MD;  Location: [facility] OR;  Service: Neurosurgery;  Laterality: N/A;   LAMINECTOMY WITH POSTERIOR LATERAL ARTHRODESIS LEVEL 4 N/A 05/22/2020   Procedure: Posterior lateral fusion - Lumbar two-Sacral one with Sacral two AIar/iIliac screws - Mazor;  Surgeon: Louis Shove, MD;  Location: [facility] OR;  Service: Neurosurgery;  Laterality: N/A;   posterior cervical disc fusion  02/26/2017   ROTATOR CUFF REPAIR Bilateral    THORACIC DISC SURGERY     2017, 2 ruptured discs, had emergency surgery   Past Surgical History:  Procedure Laterality Date   ANTERIOR CERVICAL DECOMP/DISCECTOMY FUSION  10/15/2017   Dr. Louis   ANTERIOR LAT LUMBAR FUSION Left 09/09/2022   Procedure: LEFT LUMBAR TWO-THREE, LUMBAR THREE-FOUR ANTERIOR LUMBAR INTERBODY FUSION WITH LATERAL PLATE;  Surgeon: Louis Shove, MD;  Location: [facility] OR;  Service: Neurosurgery;  Laterality: Left;   APPLICATION OF ROBOTIC ASSISTANCE FOR SPINAL PROCEDURE N/A 05/22/2020   Procedure: APPLICATION OF ROBOTIC ASSISTANCE FOR SPINAL PROCEDURE;  Surgeon: Louis Shove, MD;  Location: [facility] OR;  Service: Neurosurgery;   Laterality: N/A;   COLONOSCOPY     HARDWARE REMOVAL N/A 12/09/2019   Procedure: Removal of Lumbar Four, Lumbar Five and Sacral One pedicle screws;  Surgeon: Louis Shove, MD;  Location: [facility] OR;  Service: Neurosurgery;  Laterality: N/A;  Removal of Lumbar Four, Lumbar Five and Sacral One pedicle screws   HARDWARE REMOVAL Bilateral 03/03/2022   Procedure: Removal of bilateral Lumbar two pedicle screws;  Surgeon: Louis Shove, MD;  Location: [facility] OR;  Service: Neurosurgery;  Laterality: Bilateral;   JOINT REPLACEMENT Left    Knee   LAMINECTOMY WITH POSTERIOR LATERAL ARTHRODESIS LEVEL 4 N/A 05/15/2020   Procedure: CANCELLED PROCEDURE;  Surgeon: Louis Shove, MD;  Location: [facility] OR;  Service: Neurosurgery;  Laterality: N/A;   LAMINECTOMY WITH POSTERIOR LATERAL ARTHRODESIS LEVEL 4 N/A 05/22/2020   Procedure: Posterior lateral fusion - Lumbar two-Sacral one with Sacral two AIar/iIliac screws - Mazor;  Surgeon: Louis Shove, MD;  Location: [facility] OR;  Service: Neurosurgery;  Laterality: N/A;   posterior cervical disc fusion  02/26/2017   ROTATOR CUFF REPAIR Bilateral    THORACIC DISC SURGERY     2017, 2 ruptured discs, had emergency surgery   Past Medical  History:  Diagnosis Date   Anxiety    Arthritis    Constipation due to opioid therapy    Depression    due to pain   History of kidney stones    history of previous kidney stones, several    Neuropathy    BP 131/84   Pulse 93   Ht 5' 11 (1.803 m)   Wt 232 lb (105.2 kg)   SpO2 94%   BMI 32.36 kg/m   Opioid Risk Score:   Fall Risk Score:  `1  Depression screen [facility] 2/9     01/06/2024    1:18 PM 09/09/2023   12:42 PM 04/29/2023    9:25 AM  Depression screen PHQ 2/9  Decreased Interest 3 0 2  Down, Depressed, Hopeless 3 0 1  PHQ - 2 Score 6 0 3  Altered sleeping   3  Tired, decreased energy   3  Change in appetite   3  Feeling bad or failure about yourself    2  Trouble concentrating   2  Moving slowly or fidgety/restless   1  Suicidal  thoughts   0  PHQ-9 Score   17      Data saved with a previous flowsheet row definition     Review of Systems  Musculoskeletal:  Positive for back pain.       Pain in back of legs  All other systems reviewed and are negative.      Objective:   Physical Exam  General: No acute distress HEENT: NCAT, EOMI, oral membranes moist Cards: reg rate  Chest: normal effort Abdomen: Soft, NT, ND Skin: dry, intact Extremities: no edema Psych: pleasant and appropriate  Skin: Clean and intact without signs of breakdown Neuro:  Alert and oriented x 3. Normal insight and awareness. Intact Memory. Normal language and speech. Cranial nerve exam unremarkable. MMT: Upper extremity motor function is 5 out of 5.  Right lower extremity he is 4 - hip flexion 4 knee extension 4+ ankle dorsiflexion plantarflexion.  Left lower extremity is 4 to 4+ out of 5 throughout.  He still has a T10 sensory level.  His sensory exam is more diminished in the right lower extremity than the left.  He remains hyperreflexive in the right leg at 3+ and he is at least 2+ in the left leg once again. Lower ext tone 2/4.   SABRA   Musculoskeletal:  loimited lumbar rom, TTP throughout low back           Assessment & Plan:  1 Hx of multiple spinal surgeries over the last 8-10 years. 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  His main issues currently are are spasticity in his lower extremities as well as dysesthesias.             - continue Baclofen   20 mg 3 times daily               -continue gabapentin   600mg  TID             -Continue to stretch and move as tolerated             -in a holding patient with surgeron re: injectons , further surgery?              -trial of butrans  patch 5mcg weekly to start 2.  Neurogenic bowel-he is only emptying his  bowels once a week             - senna-s 3 per day are somewhat effective  -he needs to eat more consistently  3.  Neurogenic bladder: bladder empties better when his bowels move.      20+ minutes of face to face patient care time were spent during this visit. All questions were encouraged and answered. Follow up with me in 4

## 2024-05-04 NOTE — Patient Instructions (Signed)
  VISIT SUMMARY: During your visit, we discussed your ongoing issues with thoracic myelopathy, spasticity, and chronic pain. We reviewed your current pain management regimen and made some adjustments to help improve your comfort. We also talked about your physical therapy routine, sleep disturbances, and strategies for managing your neurogenic bowel and bladder dysfunction. Additionally, we addressed your efforts in weight management and dietary modifications.  YOUR PLAN: -THORACIC MYELOPATHY WITH NEUROGENIC BOWEL AND BLADDER, SPASTICITY, AND CHRONIC PAIN: Thoracic myelopathy is a condition where there is compression of the spinal cord in the upper back, leading to symptoms such as pain, spasticity, and bowel and bladder dysfunction. We will continue your current medications, including baclofen  and gabapentin , to manage spasticity. For pain management, we have initiated a Butrans  patch (5 mcg/hr) to be used weekly, pending insurance coverage, and you will continue using oxycodone  (10 mg) for breakthrough pain. Your neurogenic bowel will be managed with Senna, and you are encouraged to maintain a diet rich in fruits, vegetables, and water. Physical therapy exercises for core strengthening and posture improvement should be continued.  INSTRUCTIONS: Please follow up with your neurosurgeon as needed and continue with your physical therapy sessions. If you experience any new or worsening symptoms, contact our office immediately.      Contains text generated by [employer].

## 2024-05-04 NOTE — Telephone Encounter (Signed)
 Pharmacy called stating that Butrans patch says dispense 1 patch but it comes in a box of 4 so it would need to say dispense 4. Can you resend prescription reflecting the change.

## 2024-05-05 MED ORDER — BUPRENORPHINE 5 MCG/HR TD PTWK: 1.0000 | MEDICATED_PATCH | TRANSDERMAL | 4 refills | Status: DC

## 2024-05-05 NOTE — Telephone Encounter (Signed)
 My apologies. That was a typo on my behalf.  It's fixed.  thanks

## 2024-06-01 ENCOUNTER — Telehealth: Payer: Self-pay | Admitting: *Deleted

## 2024-06-01 DIAGNOSIS — M792 Neuralgia and neuritis, unspecified: Secondary | ICD-10-CM

## 2024-06-01 DIAGNOSIS — M4714 Other spondylosis with myelopathy, thoracic region: Secondary | ICD-10-CM

## 2024-06-01 MED ORDER — BUPRENORPHINE 5 MCG/HR TD PTWK: 1.0000 | MEDICATED_PATCH | TRANSDERMAL | 4 refills | Status: AC

## 2024-06-01 MED ORDER — OXYCODONE HCL 10 MG PO TABS: 10.0000 mg | ORAL_TABLET | ORAL | 0 refills | Status: DC

## 2024-06-01 NOTE — Telephone Encounter (Signed)
 Chase Gonzalez called and says he needs a refill on his butrans  patch, they are working well. He also reports that Dr Louis says that Dr Babs should be prescribing his oxycodone  as well.  Per PMP 05/09/2024 Oxycodone  Hcl (Ir) 10 Mg Tab #90.0   Me Ber   05/05/2024 Buprenorphine  5 Mcg/hr Patch #4.00  Za Swa

## 2024-06-01 NOTE — Telephone Encounter (Signed)
 Dr. Louis does???  Rxes sent

## 2024-06-20 ENCOUNTER — Other Ambulatory Visit: Payer: Self-pay

## 2024-06-20 DIAGNOSIS — M4714 Other spondylosis with myelopathy, thoracic region: Secondary | ICD-10-CM

## 2024-06-20 DIAGNOSIS — M792 Neuralgia and neuritis, unspecified: Secondary | ICD-10-CM

## 2024-06-20 MED ORDER — OXYCODONE HCL 10 MG PO TABS: 10.0000 mg | ORAL_TABLET | ORAL | 0 refills | Status: DC

## 2024-06-23 ENCOUNTER — Telehealth: Payer: Self-pay

## 2024-06-23 DIAGNOSIS — M792 Neuralgia and neuritis, unspecified: Secondary | ICD-10-CM

## 2024-06-23 DIAGNOSIS — M4714 Other spondylosis with myelopathy, thoracic region: Secondary | ICD-10-CM

## 2024-06-23 NOTE — Telephone Encounter (Signed)
 [phone]  Chase Gonzalez is out of his Oxycodone  10 mg.   He stated he was taking the Oxycodone  3-4 times a day. And that he will not be able to get it refill until next week.  Patient wanted to know if you can send in a new script to [facility]?

## 2024-06-24 MED ORDER — OXYCODONE HCL 10 MG PO TABS: 10.0000 mg | ORAL_TABLET | ORAL | 0 refills | Status: AC

## 2024-06-24 NOTE — Telephone Encounter (Signed)
 The goal is to take this less. That's why we started the butrans .  I will write for enough to take 3 x daily. Perhaps butrans  needs to be increased?

## 2024-08-03 ENCOUNTER — Encounter: Admitting: Physical Medicine & Rehabilitation
